# Patient Record
Sex: Female | Born: 1955 | Race: White | Hispanic: No | Smoking: Former smoker
Health system: Southern US, Community
[De-identification: ages and names within clinical notes are randomized; demographics above are authoritative.]

## PROBLEM LIST (undated history)

## (undated) DIAGNOSIS — E756 Lipid storage disorder, unspecified: Secondary | ICD-10-CM

## (undated) DIAGNOSIS — J45909 Unspecified asthma, uncomplicated: Secondary | ICD-10-CM

## (undated) DIAGNOSIS — E8809 Other disorders of plasma-protein metabolism, not elsewhere classified: Secondary | ICD-10-CM

## (undated) DIAGNOSIS — I10 Essential (primary) hypertension: Secondary | ICD-10-CM

## (undated) DIAGNOSIS — C801 Malignant (primary) neoplasm, unspecified: Secondary | ICD-10-CM

## (undated) DIAGNOSIS — R011 Cardiac murmur, unspecified: Secondary | ICD-10-CM

## (undated) DIAGNOSIS — D649 Anemia, unspecified: Secondary | ICD-10-CM

## (undated) DIAGNOSIS — K219 Gastro-esophageal reflux disease without esophagitis: Secondary | ICD-10-CM

## (undated) DIAGNOSIS — D219 Benign neoplasm of connective and other soft tissue, unspecified: Secondary | ICD-10-CM

## (undated) DIAGNOSIS — F419 Anxiety disorder, unspecified: Secondary | ICD-10-CM

## (undated) DIAGNOSIS — D499 Neoplasm of unspecified behavior of unspecified site: Secondary | ICD-10-CM

## (undated) DIAGNOSIS — T7840XA Allergy, unspecified, initial encounter: Secondary | ICD-10-CM

## (undated) HISTORY — DX: Essential (primary) hypertension: I10

## (undated) HISTORY — DX: Unspecified asthma, uncomplicated: J45.909

## (undated) HISTORY — DX: Neoplasm of unspecified behavior of unspecified site: D49.9

## (undated) HISTORY — DX: Allergy, unspecified, initial encounter: T78.40XA

## (undated) HISTORY — DX: Benign neoplasm of connective and other soft tissue, unspecified: D21.9

## (undated) HISTORY — DX: Anxiety disorder, unspecified: F41.9

## (undated) HISTORY — DX: Gastro-esophageal reflux disease without esophagitis: K21.9

## (undated) HISTORY — PX: TUBAL LIGATION: SHX77

## (undated) HISTORY — DX: Cardiac murmur, unspecified: R01.1

## (undated) HISTORY — DX: Anemia, unspecified: D64.9

## (undated) HISTORY — DX: Malignant (primary) neoplasm, unspecified: C80.1

---

## 2006-03-04 ENCOUNTER — Ambulatory Visit: Payer: Self-pay | Admitting: Gastroenterology

## 2006-05-27 HISTORY — PX: HYSTEROSCOPY: SHX211

## 2006-07-16 ENCOUNTER — Encounter (INDEPENDENT_AMBULATORY_CARE_PROVIDER_SITE_OTHER): Payer: Self-pay | Admitting: *Deleted

## 2006-07-16 ENCOUNTER — Ambulatory Visit (HOSPITAL_BASED_OUTPATIENT_CLINIC_OR_DEPARTMENT_OTHER): Admission: RE | Admit: 2006-07-16 | Discharge: 2006-07-16 | Payer: Self-pay | Admitting: Obstetrics and Gynecology

## 2006-09-10 ENCOUNTER — Ambulatory Visit: Payer: Self-pay

## 2006-10-22 ENCOUNTER — Other Ambulatory Visit: Admission: RE | Admit: 2006-10-22 | Discharge: 2006-10-22 | Payer: Self-pay | Admitting: Obstetrics and Gynecology

## 2007-05-28 HISTORY — PX: ENDOMETRIAL ABLATION: SHX621

## 2007-10-27 ENCOUNTER — Other Ambulatory Visit: Admission: RE | Admit: 2007-10-27 | Discharge: 2007-10-27 | Payer: Self-pay | Admitting: Obstetrics and Gynecology

## 2008-06-22 ENCOUNTER — Emergency Department: Payer: Self-pay | Admitting: Emergency Medicine

## 2008-11-02 ENCOUNTER — Ambulatory Visit: Payer: Self-pay | Admitting: Obstetrics and Gynecology

## 2008-11-02 ENCOUNTER — Encounter: Payer: Self-pay | Admitting: Obstetrics and Gynecology

## 2008-11-02 ENCOUNTER — Other Ambulatory Visit: Admission: RE | Admit: 2008-11-02 | Discharge: 2008-11-02 | Payer: Self-pay | Admitting: Obstetrics and Gynecology

## 2008-12-20 ENCOUNTER — Ambulatory Visit: Payer: Self-pay | Admitting: Obstetrics and Gynecology

## 2008-12-28 ENCOUNTER — Ambulatory Visit: Payer: Self-pay | Admitting: Obstetrics and Gynecology

## 2009-11-07 ENCOUNTER — Other Ambulatory Visit: Admission: RE | Admit: 2009-11-07 | Discharge: 2009-11-07 | Payer: Self-pay | Admitting: Obstetrics and Gynecology

## 2009-11-07 ENCOUNTER — Ambulatory Visit: Payer: Self-pay | Admitting: Obstetrics and Gynecology

## 2009-12-27 ENCOUNTER — Ambulatory Visit (HOSPITAL_COMMUNITY): Admission: RE | Admit: 2009-12-27 | Discharge: 2009-12-27 | Payer: Self-pay | Admitting: Obstetrics and Gynecology

## 2010-10-12 NOTE — Op Note (Signed)
Diane Gomez, Diane Gomez             ACCOUNT NO.:  [id number]   MEDICAL RECORD NO.:  [id number]          PATIENT TYPE:  AMB   LOCATION:  [facility]                         FACILITY:  [facility]   PHYSICIAN:  Daniel L. 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   POSTOPERATIVE DIAGNOSIS:  Menorrhagia with submucous myoma.   OPERATIONS:  Hysteroscopic myomectomy.   SURGEON:  Daniel L. Eda Paschal, M.D.   ANESTHESIA:  General.   INDICATIONS:  The patient is a 55 year old female who had presented to  the office with persistent menorrhagia.  That was progressively getting  worse.  A saline infusion histogram was done.  She had an intrauterine  cavity defect that was in the lower uterine segment that looked to be  most consistent with a submucous myoma.  It was very difficult to get  past it to even do an SIH.   FINDINGS:  External is normal.  BUS is normal.  Vaginal is normal.  Cervix is clean.  Uterus is anteverted, normal size and shape.  There is  1-1/2 degrees of descensus.  Adnexa failed to reveal masses.  At the  time of hysteroscopy, the patient had a 3-4 cm submucous myoma.  It  started right at the lower uterine segment blocking access into the  intrauterine cavity.  It appeared to be attached, at the top of the  fundus, posteriorly.  Once it was removed; the patient's cavity was  otherwise unremarkable.   DESCRIPTION OF PROCEDURE:  After adequate general endotracheal  anesthesia the patient was placed in the dorsal supine position; and  prepped and draped in the usual sterile manner.  A single-tooth  tenaculum was placed in the anterior lip of the cervix.  An attempt was  made to dilate her cervix which was difficult because of the obstructing  lower uterine segment mass.  She was dilated to a #25 Pratt dilator.  A  diagnostic hysteroscope was introduced.  It was obvious that she had a  submucous myoma filling the lower uterine cavity as well as the top.  We  could, however, get the hysteroscope passed it.  At this point, she was  dilated further to a #31 Pratt dilator knowing exactly which way the  canal and the cavity went.  The hysteroscopic resectoscope was then  introduced.  It was attached to 3% sorbitol to expand the intrauterine  cavity.  A camera for magnification, and a 90-degree wire loop was used  with appropriate settings.   Starting carefully we were able to at least partially get past the myoma  resect enough of it, to collapse a little bit, then we could get higher-  and-higher and finally after approximately 25 minutes we could resect  the entire myoma; and it was sent to pathology in multiple pieces.  There was multiple oozing from where it was attached to the endometrial  cavity.  These were all cauterized; at the termination of procedure  there was absolutely no bleeding  noted.  A fair bit of the endometrial cavity was cauterized; and it was  felt that she almost had had an endometrial ablation as well.  Fluid  deficit was 200 mL.  Blood loss was minimal.  The patient tolerated the  procedure well; and left the operating satisfactory condition.      Daniel L. Eda Paschal, M.D.  Electronically Signed     DLG/MEDQ  D:  07/16/2006  T:  07/16/2006  Job:  [id number]

## 2010-11-13 ENCOUNTER — Encounter (INDEPENDENT_AMBULATORY_CARE_PROVIDER_SITE_OTHER): Payer: 59 | Admitting: Obstetrics and Gynecology

## 2010-11-13 ENCOUNTER — Other Ambulatory Visit: Payer: Self-pay | Admitting: Obstetrics and Gynecology

## 2010-11-13 ENCOUNTER — Other Ambulatory Visit (HOSPITAL_COMMUNITY)
Admission: RE | Admit: 2010-11-13 | Discharge: 2010-11-13 | Disposition: A | Discharge status: Home / Self Care | Payer: 59 | Source: Ambulatory Visit | Attending: Obstetrics and Gynecology | Admitting: Obstetrics and Gynecology

## 2010-11-13 DIAGNOSIS — Z124 Encounter for screening for malignant neoplasm of cervix: Secondary | ICD-10-CM | POA: Insufficient documentation

## 2010-11-13 DIAGNOSIS — R82998 Other abnormal findings in urine: Secondary | ICD-10-CM

## 2010-11-13 DIAGNOSIS — Z01419 Encounter for gynecological examination (general) (routine) without abnormal findings: Secondary | ICD-10-CM

## 2011-01-01 ENCOUNTER — Ambulatory Visit (HOSPITAL_COMMUNITY): Payer: 59 | Attending: Obstetrics and Gynecology

## 2011-01-01 DIAGNOSIS — M810 Age-related osteoporosis without current pathological fracture: Secondary | ICD-10-CM | POA: Insufficient documentation

## 2011-10-18 ENCOUNTER — Encounter: Payer: Self-pay | Admitting: Obstetrics and Gynecology

## 2011-10-18 ENCOUNTER — Ambulatory Visit (INDEPENDENT_AMBULATORY_CARE_PROVIDER_SITE_OTHER): Payer: Self-pay | Admitting: Obstetrics and Gynecology

## 2011-10-18 VITALS — BP 120/76 | Ht 61.5 in | Wt 136.0 lb

## 2011-10-18 DIAGNOSIS — I10 Essential (primary) hypertension: Secondary | ICD-10-CM | POA: Insufficient documentation

## 2011-10-18 DIAGNOSIS — Z01419 Encounter for gynecological examination (general) (routine) without abnormal findings: Secondary | ICD-10-CM

## 2011-10-18 DIAGNOSIS — M810 Age-related osteoporosis without current pathological fracture: Secondary | ICD-10-CM | POA: Insufficient documentation

## 2011-10-18 DIAGNOSIS — D259 Leiomyoma of uterus, unspecified: Secondary | ICD-10-CM | POA: Insufficient documentation

## 2011-10-18 LAB — LIPID PANEL
LDL Cholesterol: 149 mg/dL — ABNORMAL HIGH (ref 0–99)
VLDL: 16 mg/dL (ref 0–40)

## 2011-10-18 MED ORDER — ESTRADIOL 1 MG PO TABS: 1.0000 mg | ORAL_TABLET | Freq: Every day | ORAL | Status: DC

## 2011-10-18 MED ORDER — MEDROXYPROGESTERONE ACETATE 2.5 MG PO TABS: 2.5000 mg | ORAL_TABLET | Freq: Every day | ORAL | Status: DC

## 2011-10-18 NOTE — Progress Notes (Signed)
Patient came to see me today for her annual GYN exam. She has remained amenorrheic both from her endometrial ablation and then from menopause. Initially after she was diagnosed as menopausal she did well. At one point she was bothered by vaginal dryness. She is currently fine vaginally without symptoms. She is however having more hot flashes and was wondering about treatment. She is due for yearly mammogram. She is having no pelvic pain. She has osteoporosis on bone density and is done 2 years of Reclast without problems. She takes calcium and vitamin D regularly. She has had no fractures. Her last Reclast was in July of 2012. She is currently on ensured and has not had a bone density since starting Reclast. She requested we checked her cholesterol today. It was last checked by [place] in June of 2010 and her total cholesterol was 242 with her HDL 100. Her triglycerides were elevated 198. Her LDL was 103. She has fasted today.  Physical examination: Kennon Portela present. HEENT within normal limits. Neck: Thyroid not large. No masses. Supraclavicular nodes: not enlarged. Breasts: Examined in both sitting and lying  position. No skin changes and no masses. Abdomen: Soft no guarding rebound or masses or hernia. Pelvic: External: Within normal limits. BUS: Within normal limits. Vaginal:within normal limits. Good estrogen effect. No evidence of cystocele rectocele or enterocele. Cervix: clean. Uterus: Normal size and shape. Adnexa: No masses. Rectovaginal exam: Confirmatory and negative. Extremities: Within normal limits.  Assessment: #1. Menopausal symptoms #2. Osteoporosis #3.  Abnormal lipid profile in 2010.  Plan: We discussed pros and cons of HRT and she will start estradiol 1 mg daily with medroxyprogesterone  2.5 mg daily. Information given on a mammographic scolarship. We discussed followup bone density to be sure Reclast is working. If she cannot afford to do this I suggested we follow through with her  third year of Reclast in July and she will call back in June to schedule. Lipid profile done.

## 2011-10-18 NOTE — Patient Instructions (Signed)
Call in June to schedule Reclast for July.

## 2011-10-19 LAB — URINALYSIS W MICROSCOPIC + REFLEX CULTURE
Casts: NONE SEEN
Glucose, UA: NEGATIVE mg/dL
pH: 7 (ref 5.0–8.0)

## 2011-10-20 LAB — URINE CULTURE: Organism ID, Bacteria: NO GROWTH

## 2011-10-23 ENCOUNTER — Other Ambulatory Visit: Payer: Self-pay | Admitting: *Deleted

## 2011-10-23 DIAGNOSIS — E7800 Pure hypercholesterolemia, unspecified: Secondary | ICD-10-CM

## 2011-10-23 DIAGNOSIS — D729 Disorder of white blood cells, unspecified: Secondary | ICD-10-CM

## 2011-10-23 NOTE — Progress Notes (Signed)
Addended by: Valeda Malm L on: 10/23/2011 09:39 AM   Modules accepted: Orders

## 2011-10-23 NOTE — Progress Notes (Signed)
Addended by: Valeda Malm L on: 10/23/2011 09:42 AM   Modules accepted: Orders

## 2011-12-24 ENCOUNTER — Telehealth: Payer: Self-pay | Admitting: *Deleted

## 2011-12-24 NOTE — Telephone Encounter (Signed)
I did not understand the line "no insurance would prefer pills over patches". Please explain. Also before you respond to me call patient and tell her one other option would be progesterone for 12 days every third month with endometrial biopsy in 6 months. She would then only bleed once every 3 months. If that is not acceptable office visit for more creative suggestions.

## 2011-12-24 NOTE — Telephone Encounter (Signed)
Patient said she is ok with this option. The line "no insurance would prefer pills over patches" means the patient has no insurance, she would prefer a new pill over having patches prescribed for her.

## 2011-12-24 NOTE — Telephone Encounter (Signed)
Patient informed instructions.

## 2011-12-24 NOTE — Telephone Encounter (Signed)
Patient states she tried Estradiol and Provera for a month then started bleeding heavy for a week.  So she stopped meds completely for a month now, and her hormone sweats have gotten worse than they were before trying the meds.  She said "I can't deal with all this bleeding while on the pills".  Wants to know if there is another option? No insurance would prefer pills over patches.  Please advise.

## 2011-12-24 NOTE — Telephone Encounter (Signed)
Have patient do estradiol 1 mg daily. Have her do medroxyprogesterone 5 mg first 12 days of every third month. Have her do it first in October. Hopefully she will not bleed. I would like to see her in late October.

## 2012-01-08 ENCOUNTER — Other Ambulatory Visit: Payer: Self-pay | Admitting: Obstetrics and Gynecology

## 2012-01-08 DIAGNOSIS — M810 Age-related osteoporosis without current pathological fracture: Secondary | ICD-10-CM

## 2012-01-16 ENCOUNTER — Ambulatory Visit (INDEPENDENT_AMBULATORY_CARE_PROVIDER_SITE_OTHER): Payer: Self-pay

## 2012-01-16 DIAGNOSIS — M810 Age-related osteoporosis without current pathological fracture: Secondary | ICD-10-CM

## 2012-01-29 ENCOUNTER — Telehealth: Payer: Self-pay | Admitting: *Deleted

## 2012-01-29 NOTE — Telephone Encounter (Signed)
Pt called ready to set up reclast. Will get calcium and creatinine at [facility]. Will mail order to patient. Once received results I will call patient and schedule reclast. Renette Butters

## 2012-02-07 NOTE — Telephone Encounter (Signed)
Lm for pt asking about labwork we havent received results yet. KW

## 2012-02-18 ENCOUNTER — Encounter: Payer: Self-pay | Admitting: *Deleted

## 2012-02-18 NOTE — Telephone Encounter (Signed)
Labs received and nml. Reclast apt at [facility] 10/4 at 9am, pt informed and order faxed. Reminded pt about Tylenol ES tid starting day before infusion. Reminder mailed to pt. KW

## 2012-02-27 ENCOUNTER — Other Ambulatory Visit (HOSPITAL_COMMUNITY): Payer: Self-pay | Admitting: *Deleted

## 2012-02-28 ENCOUNTER — Ambulatory Visit (HOSPITAL_COMMUNITY)
Admission: RE | Admit: 2012-02-28 | Discharge: 2012-02-28 | Disposition: A | Discharge status: Home / Self Care | Payer: Self-pay | Source: Ambulatory Visit | Attending: Obstetrics and Gynecology | Admitting: Obstetrics and Gynecology

## 2012-02-28 DIAGNOSIS — S72009A Fracture of unspecified part of neck of unspecified femur, initial encounter for closed fracture: Secondary | ICD-10-CM | POA: Insufficient documentation

## 2012-02-28 DIAGNOSIS — M810 Age-related osteoporosis without current pathological fracture: Secondary | ICD-10-CM | POA: Insufficient documentation

## 2012-02-28 DIAGNOSIS — X58XXXA Exposure to other specified factors, initial encounter: Secondary | ICD-10-CM | POA: Insufficient documentation

## 2012-02-28 DIAGNOSIS — Z880 Allergy status to penicillin: Secondary | ICD-10-CM | POA: Insufficient documentation

## 2012-02-28 MED ORDER — ZOLEDRONIC ACID 5 MG/100ML IV SOLN: 5.0000 mg | Freq: Once | INTRAVENOUS | Status: DC

## 2012-02-28 MED ORDER — ZOLEDRONIC ACID 5 MG/100ML IV SOLN
INTRAVENOUS | Status: AC
  Administered 2012-02-28: 5 mg
  Filled 2012-02-28: qty 100

## 2012-03-02 ENCOUNTER — Telehealth: Payer: Self-pay | Admitting: *Deleted

## 2012-03-02 NOTE — Telephone Encounter (Signed)
Office visit now.   

## 2012-03-02 NOTE — Telephone Encounter (Signed)
(  pt aware you out of the office) Pt is calling c/o sweating while taking her progesterone 5 mg tablets. Pt would just break out sweating while putting on her make up. Pt is not having any bleeding, some cramping. On 12/24/11 office note you asked pt to make OV in late October, would you like pt to make OV now? Or wait to later in month? Please advise

## 2012-03-03 NOTE — Telephone Encounter (Signed)
Left the below on pt voicemail. 

## 2012-03-05 ENCOUNTER — Ambulatory Visit (INDEPENDENT_AMBULATORY_CARE_PROVIDER_SITE_OTHER): Payer: Self-pay | Admitting: Obstetrics and Gynecology

## 2012-03-05 DIAGNOSIS — N951 Menopausal and female climacteric states: Secondary | ICD-10-CM

## 2012-03-05 DIAGNOSIS — R4586 Emotional lability: Secondary | ICD-10-CM

## 2012-03-05 DIAGNOSIS — F39 Unspecified mood [affective] disorder: Secondary | ICD-10-CM

## 2012-03-05 MED ORDER — PROGESTERONE MICRONIZED 200 MG PO CAPS: ORAL_CAPSULE | ORAL | Status: DC

## 2012-03-05 NOTE — Progress Notes (Signed)
Earlier  this year we had initiated hormone replacement therapy with daily estradiol and progestin. Patient had persistent bleeding. She has a history of a submucous myoma which we removed with hysteroscope. As a result of this we kept her on daily estradiol but tried 12 days of Provera every third month. When she stopped her progestin she stopped bleeding completely. She started her Provera 5 mg on October 1 and has had terrible mood swings and irritability. She actually says it somewhat better today. We had a very long discussion of the above. She does understand that this is a protocol that is not usually used and we need to be careful about her endometrium and hyperplasia and uterine cancer. We discussed  today switching her to Prometrium and I did call it in for her. We however after a very long discussion decided to continue the Provera and see if her symptoms get better. I did ask her to return at the end of December for endometrial biopsy to be sure her endometrium is adequately protect.

## 2012-03-05 NOTE — Patient Instructions (Signed)
Return for endometrial biopsy in late December.

## 2012-03-23 ENCOUNTER — Ambulatory Visit (INDEPENDENT_AMBULATORY_CARE_PROVIDER_SITE_OTHER): Payer: Self-pay | Admitting: Obstetrics and Gynecology

## 2012-03-23 DIAGNOSIS — IMO0002 Reserved for concepts with insufficient information to code with codable children: Secondary | ICD-10-CM

## 2012-03-23 DIAGNOSIS — N898 Other specified noninflammatory disorders of vagina: Secondary | ICD-10-CM

## 2012-03-23 LAB — WET PREP FOR TRICH, YEAST, CLUE: Yeast Wet Prep HPF POC: NONE SEEN

## 2012-03-23 MED ORDER — METRONIDAZOLE 0.75 % VA GEL: 1.0000 | Freq: Every day | VAGINAL | Status: DC

## 2012-03-23 NOTE — Patient Instructions (Addendum)
Return in December for biopsy.

## 2012-03-23 NOTE — Progress Notes (Signed)
Patient came in today with the following complaints. She took Provera 5 mg day 1 to 12th as per our previous discussion. She remains on daily estrogen. Starting on October 13 she had a menstrual cycle which seems to be resolving now. She however has associated it with a disagreeable vaginal odor and dyspareunia every time they have sex which is unusual for her.  Exam: Kennon Portela present.Pelvic exam: External within normal limits. BUS within normal limits. Vaginal exam within normal limits Except for frothy discharge. Cervix is clean without lesions. Uterus is normal size and shape And nontender. Adnexa failed to reveal masses nor was there  tenderness.  Wet prep positive for amine, many clue cells, and too numerous to count bacteria. Negative for yeast.  Assessment: Bacterial vaginosis  Plan: MetroGel-Vaginal cream vaginally for 5 nights. Endometrial biopsy in December.

## 2012-03-27 ENCOUNTER — Telehealth: Payer: Self-pay | Admitting: *Deleted

## 2012-03-27 MED ORDER — FLUCONAZOLE 150 MG PO TABS: 150.0000 mg | ORAL_TABLET | Freq: Once | ORAL | Status: DC

## 2012-03-27 NOTE — Telephone Encounter (Signed)
Please call and call in Diflucan 150 by mouth times one dose with 1 refill. Inform she can gett for $4 at [place], [employer], or [employer]. Office visit if no relief

## 2012-03-27 NOTE — Telephone Encounter (Signed)
Pt informed with the below note. Rx sent.  

## 2012-03-27 NOTE — Telephone Encounter (Signed)
(  Dr.G patient) pt was given Metrogel x 5 nights on 03/23/12, pt said she now has yeast infection. Requesting medication for this itching, and vaginal burning. Pt has no insurance and would like something generic. Please advise

## 2012-05-25 ENCOUNTER — Ambulatory Visit (INDEPENDENT_AMBULATORY_CARE_PROVIDER_SITE_OTHER): Payer: Self-pay | Admitting: Obstetrics and Gynecology

## 2012-05-25 ENCOUNTER — Encounter: Payer: Self-pay | Admitting: Obstetrics and Gynecology

## 2012-05-25 DIAGNOSIS — N950 Postmenopausal bleeding: Secondary | ICD-10-CM

## 2012-05-25 NOTE — Progress Notes (Signed)
Patient came back today for an endometrial biopsy due to only taking progestin 12 days every third month. She did it in  October and had normal withdrawal bleeding. She is scheduled to also do it the first 12 days in January. She has started to notice however that the hot flashes have recurred on 1 mg estradiol.  Exam: Beola Cord present.Pelvic exam: External within normal limits. BUS within normal limits. Vaginal exam within normal limits. Cervix is clean without lesions. Uterus is normal size and shape. Adnexa failed to reveal masses. Rectovaginal examination is confirmatory and without masses.   Assessment: Postmenopausal bleeding. On nontraditional  protocol of progestin every third month. Hot flashes.  Plan: Endometrial biopsy done. We had to open her cervix with an os finder in order to obtain a biopsy. She will call for results. She will start her Provera on January 1. If biopsy OK she will increase her estradiol to 1.5 mg daily. She will return in February-March to discuss Dianna Rossetti so she can stop her progestin. Patient had a mild vasovagal response to biopsy. She was given 500mg  of Naprelan.

## 2012-05-25 NOTE — Patient Instructions (Signed)
Call for biopsy results.

## 2012-07-08 ENCOUNTER — Encounter: Payer: Self-pay | Admitting: *Deleted

## 2012-07-08 NOTE — Progress Notes (Signed)
Patient ID: Diane Gomez, female   DOB: [birth year], 57 y.o.   MRN: [id number] Pt called requesting if new drug Massie Kluver has been release. Left message on pt voicemail that drug is not out yet.

## 2012-10-21 ENCOUNTER — Other Ambulatory Visit (HOSPITAL_COMMUNITY)
Admission: RE | Admit: 2012-10-21 | Discharge: 2012-10-21 | Disposition: A | Discharge status: Home / Self Care | Payer: Self-pay | Source: Ambulatory Visit | Attending: Women's Health | Admitting: Women's Health

## 2012-10-21 ENCOUNTER — Encounter: Payer: Self-pay | Admitting: Women's Health

## 2012-10-21 ENCOUNTER — Ambulatory Visit (INDEPENDENT_AMBULATORY_CARE_PROVIDER_SITE_OTHER): Payer: Self-pay | Admitting: Women's Health

## 2012-10-21 VITALS — BP 124/78 | Ht 61.0 in | Wt 130.0 lb

## 2012-10-21 DIAGNOSIS — Z79890 Hormone replacement therapy (postmenopausal): Secondary | ICD-10-CM

## 2012-10-21 DIAGNOSIS — Z01419 Encounter for gynecological examination (general) (routine) without abnormal findings: Secondary | ICD-10-CM

## 2012-10-21 LAB — LIPID PANEL
Cholesterol: 237 mg/dL — ABNORMAL HIGH (ref 0–200)
LDL Cholesterol: 107 mg/dL — ABNORMAL HIGH (ref 0–99)
VLDL: 27 mg/dL (ref 0–40)

## 2012-10-21 LAB — CBC WITH DIFFERENTIAL/PLATELET
Hemoglobin: 13.4 g/dL (ref 12.0–15.0)
Lymphocytes Relative: 31 % (ref 12–46)
Lymphs Abs: 1.1 10*3/uL (ref 0.7–4.0)
MCH: 32.2 pg (ref 26.0–34.0)
Monocytes Relative: 10 % (ref 3–12)
Neutro Abs: 2.1 10*3/uL (ref 1.7–7.7)
Neutrophils Relative %: 57 % (ref 43–77)
Platelets: 282 10*3/uL (ref 150–400)
RBC: 4.16 MIL/uL (ref 3.87–5.11)
WBC: 3.7 10*3/uL — ABNORMAL LOW (ref 4.0–10.5)

## 2012-10-21 LAB — GLUCOSE, RANDOM: Glucose, Bld: 95 mg/dL (ref 70–99)

## 2012-10-21 MED ORDER — ESTRADIOL 1 MG PO TABS: 1.0000 mg | ORAL_TABLET | Freq: Every day | ORAL | Status: DC

## 2012-10-21 MED ORDER — PROGESTERONE MICRONIZED 200 MG PO CAPS: ORAL_CAPSULE | ORAL | Status: DC

## 2012-10-21 NOTE — Patient Instructions (Addendum)
Health Recommendations for Postmenopausal Women Respected and ongoing research has looked at the most common causes of death, disability, and poor quality of life in postmenopausal women. The causes include heart disease, diseases of blood vessels, diabetes, depression, cancer, and bone loss (osteoporosis). Many things can be done to help lower the chances of developing these and other common problems: CARDIOVASCULAR DISEASE Heart Disease: A heart attack is a medical emergency. Know the signs and symptoms of a heart attack. Below are things women can do to reduce their risk for heart disease.   Do not smoke. If you smoke, quit.  Aim for a healthy weight. Being overweight causes many preventable deaths. Eat a healthy and balanced diet and drink an adequate amount of liquids.  Get moving. Make a commitment to be more physically active. Aim for 30 minutes of activity on most, if not all days of the week.  Eat for heart health. Choose a diet that is low in saturated fat and cholesterol and eliminate trans fat. Include whole grains, vegetables, and fruits. Read and understand the labels on food containers before buying.  Know your numbers. Ask your caregiver to check your blood pressure, cholesterol (total, HDL, LDL, triglycerides) and blood glucose. Work with your caregiver on improving your entire clinical picture.  High blood pressure. Limit or stop your table salt intake (try salt substitute and food seasonings). Avoid salty foods and drinks. Read labels on food containers before buying. Eating well and exercising can help control high blood pressure. STROKE  Stroke is a medical emergency. Stroke may be the result of a blood clot in a blood vessel in the brain or by a brain hemorrhage (bleeding). Know the signs and symptoms of a stroke. To lower the risk of developing a stroke:  Avoid fatty foods.  Quit smoking.  Control your diabetes, blood pressure, and irregular heart rate. THROMBOPHLEBITIS  (BLOOD CLOT) OF THE LEG  Becoming overweight and leading a stationary lifestyle may also contribute to developing blood clots. Controlling your diet and exercising will help lower the risk of developing blood clots. CANCER SCREENING  Breast Cancer: Take steps to reduce your risk of breast cancer.  You should practice "breast self-awareness." This means understanding the normal appearance and feel of your breasts and should include breast self-examination. Any changes detected, no matter how small, should be reported to your caregiver.  After age 40, you should have a clinical breast exam (CBE) every year.  Starting at age 40, you should consider having a mammogram (breast X-ray) every year.  If you have a family history of breast cancer, talk to your caregiver about genetic screening.  If you are at high risk for breast cancer, talk to your caregiver about having an MRI and a mammogram every year.  Intestinal or Stomach Cancer: Tests to consider are a rectal exam, fecal occult blood, sigmoidoscopy, and colonoscopy. Women who are high risk may need to be screened at an earlier age and more often.  Cervical Cancer:  Beginning at age 30, you should have a Pap test every 3 years as long as the past 3 Pap tests have been normal.  If you have had past treatment for cervical cancer or a condition that could lead to cancer, you need Pap tests and screening for cancer for at least 20 years after your treatment.  If you had a hysterectomy for a problem that was not cancer or a condition that could lead to cancer, then you no longer need Pap tests.    If you are between ages 65 and 70, and you have had normal Pap tests going back 10 years, you no longer need Pap tests.  If Pap tests have been discontinued, risk factors (such as a new sexual partner) need to be reassessed to determine if screening should be resumed.  Some medical problems can increase the chance of getting cervical cancer. In these  cases, your caregiver may recommend more frequent screening and Pap tests.  Uterine Cancer: If you have vaginal bleeding after reaching menopause, you should notify your caregiver.  Ovarian cancer: Other than yearly pelvic exams, there are no reliable tests available to screen for ovarian cancer at this time except for yearly pelvic exams.  Lung Cancer: Yearly chest X-rays can detect lung cancer and should be done on high risk women, such as cigarette smokers and women with chronic lung disease (emphysema).  Skin Cancer: A complete body skin exam should be done at your yearly examination. Avoid overexposure to the sun and ultraviolet light lamps. Use a strong sun block cream when in the sun. All of these things are important in lowering the risk of skin cancer. MENOPAUSE Menopause Symptoms: Hormone therapy products are effective for treating symptoms associated with menopause:  Moderate to severe hot flashes.  Night sweats.  Mood swings.  Headaches.  Tiredness.  Loss of sex drive.  Insomnia.  Other symptoms. Hormone replacement carries certain risks, especially in older women. Women who use or are thinking about using estrogen or estrogen with progestin treatments should discuss that with their caregiver. Your caregiver will help you understand the benefits and risks. The ideal dose of hormone replacement therapy is not known. The Food and Drug Administration (FDA) has concluded that hormone therapy should be used only at the lowest doses and for the shortest amount of time to reach treatment goals.  OSTEOPOROSIS Protecting Against Bone Loss and Preventing Fracture: If you use hormone therapy for prevention of bone loss (osteoporosis), the risks for bone loss must outweigh the risk of the therapy. Ask your caregiver about other medications known to be safe and effective for preventing bone loss and fractures. To guard against bone loss or fractures, the following is recommended:  If  you are less than age 50, take 1000 mg of calcium and at least 600 mg of Vitamin D per day.  If you are greater than age 50 but less than age 70, take 1200 mg of calcium and at least 600 mg of Vitamin D per day.  If you are greater than age 70, take 1200 mg of calcium and at least 800 mg of Vitamin D per day. Smoking and excessive alcohol intake increases the risk of osteoporosis. Eat foods rich in calcium and vitamin D and do weight bearing exercises several times a week as your caregiver suggests. DIABETES Diabetes Melitus: If you have Type I or Type 2 diabetes, you should keep your blood sugar under control with diet, exercise and recommended medication. Avoid too many sweets, starchy and fatty foods. Being overweight can make control more difficult. COGNITION AND MEMORY Cognition and Memory: Menopausal hormone therapy is not recommended for the prevention of cognitive disorders such as Alzheimer's disease or memory loss.  DEPRESSION  Depression may occur at any age, but is common in elderly women. The reasons may be because of physical, medical, social (loneliness), or financial problems and needs. If you are experiencing depression because of medical problems and control of symptoms, talk to your caregiver about this. Physical activity and   exercise may help with mood and sleep. Community and volunteer involvement may help your sense of value and worth. If you have depression and you feel that the problem is getting worse or becoming severe, talk to your caregiver about treatment options that are best for you. ACCIDENTS  Accidents are common and can be serious in the elderly woman. Prepare your house to prevent accidents. Eliminate throw rugs, place hand bars in the bath, shower and toilet areas. Avoid wearing high heeled shoes or walking on wet, snowy, and icy areas. Limit or stop driving if you have vision or hearing problems, or you feel you are unsteady with you movements and  reflexes. HEPATITIS C Hepatitis C is a type of viral infection affecting the liver. It is spread mainly through contact with blood from an infected person. It can be treated, but if left untreated, it can lead to severe liver damage over years. Many people who are infected do not know that the virus is in their blood. If you are a "baby-boomer", it is recommended that you have one screening test for Hepatitis C. IMMUNIZATIONS  Several immunizations are important to consider having during your senior years, including:   Tetanus, diptheria, and pertussis booster shot.  Influenza every year before the flu season begins.  Pneumonia vaccine.  Shingles vaccine.  Others as indicated based on your specific needs. Talk to your caregiver about these. Document Released: 07/05/2005 Document Revised: 04/29/2012 Document Reviewed: 02/29/2008 [employer] Patient Information 2014 [employer].  

## 2012-10-21 NOTE — Progress Notes (Signed)
Diane Gomez 1955-09-21 [id number]    History:    The patient presents for annual exam. Patient of Dr. Eda Paschal. Rare bleeding, currently on estradiol 1 mg and Prometrium 200 mg at bedtime every third month. Was doing a combination HRT and was having spotting with bleeding. Negative endometrial biopsy 04/2012. History of a myomectomy in 2008, HER option 2009. Osteoporosis with improvement shown on Reclast  2010 T score -2.5 at spine, -1.6 at right hip. DEXA 2013 showed significant increase in BMD , AP spine-2.2, right femoral neck -1.9. Has received 3 doses of reclast. Colonoscopy 2 benign polyps at age 25 and was instructed to return at age 61 for repeat. Normal mammogram and Pap history. Hypertension, cardiologist manages med but no labs done there.   Past medical history, past surgical history, family history and social history were all reviewed and documented in the EPIC chart. 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 Son 46, daughter 35 both doing well.   ROS:  A  ROS was performed and pertinent positives and negatives are included in the history.  Exam:  Filed Vitals:   10/21/12 0805  BP: 124/78    General appearance:  Normal Head/Neck:  Normal, without cervical or supraclavicular adenopathy. Thyroid:  Symmetrical, normal in size, without palpable masses or nodularity. Respiratory  Effort:  Normal  Auscultation:  Clear without wheezing or rhonchi Cardiovascular  Auscultation:  Regular rate, without rubs, murmurs or gallops  Edema/varicosities:  Not grossly evident Abdominal  Soft,nontender, without masses, guarding or rebound.  Liver/spleen:  No organomegaly noted  Hernia:  None appreciated  Skin  Inspection:  Grossly normal  Palpation:  Grossly normal Neurologic/psychiatric  Orientation:  Normal with appropriate conversation.  Mood/affect:  Normal  Genitourinary    Breasts: Examined lying and sitting.     Right: Without masses, retractions, discharge or  axillary adenopathy.     Left: Without masses, retractions, discharge or axillary adenopathy.   Inguinal/mons:  Normal without inguinal adenopathy  External genitalia:  Normal  BUS/Urethra/Skene's glands:  Normal  Bladder:  Normal  Vagina:  Normal  Cervix:  Normal  Uterus:  normal in size, shape and contour.  Midline and mobile  Adnexa/parametria:     Rt: Without masses or tenderness.   Lt: Without masses or tenderness.  Anus and perineum: Normal  Digital rectal exam: Normal sphincter tone without palpated masses or tenderness  Assessment/Plan:  57 y.o. DWF G3P2 for annual exam with no complaints.  Hypertension-cardiologist manages Osteoporosis - Reclast, 3 doses,  Postmenopausal on HRT  Plan: options reviewed for osteoporosis, will continue and complete 5 years on reclast Reviewed importance of scheduling dental work prior to receiving. Repeat reclast in August, will have given at  [facility]. Repeat DEXA next year.Instructed to call if any bleeding. SBE's, continue annual mammogram, calcium rich diet, vitamin D 2000 daily encouraged. CBC, glucose, lipid panel, UA, Pap, normal Pap 2012, new screening guidelines reviewed. Home Hemoccult card given with instructions. Estradiol 1 mg by mouth daily, Prometrium 200 mg at bedtime day one through 12 every third month. Reviewed [facility] study, increased risk for blood clots, strokes, breast cancer, states numerous symptoms when off would like to continue.  Discouraged tanning.    Harrington [facility], 9:43 AM 10/21/2012

## 2012-10-22 LAB — URINALYSIS W MICROSCOPIC + REFLEX CULTURE
Casts: NONE SEEN
Crystals: NONE SEEN
Glucose, UA: NEGATIVE mg/dL
Hgb urine dipstick: NEGATIVE
Leukocytes, UA: NEGATIVE
Specific Gravity, Urine: 1.023 (ref 1.005–1.030)
pH: 6 (ref 5.0–8.0)

## 2012-10-27 ENCOUNTER — Encounter: Payer: Self-pay | Admitting: Women's Health

## 2012-10-27 ENCOUNTER — Encounter: Payer: Self-pay | Admitting: Obstetrics and Gynecology

## 2012-11-11 NOTE — Progress Notes (Signed)
We received alert today that patient has not read her answer from Diane Gomez to a question she had emailed through My Chart to [place].  I copied the text and mailed it to the patient.

## 2012-11-16 ENCOUNTER — Other Ambulatory Visit: Payer: Self-pay | Admitting: Obstetrics and Gynecology

## 2012-11-18 ENCOUNTER — Other Ambulatory Visit: Payer: Self-pay | Admitting: Women's Health

## 2012-11-18 ENCOUNTER — Telehealth: Payer: Self-pay | Admitting: Women's Health

## 2012-11-18 NOTE — Telephone Encounter (Signed)
Telephone call to patient, reviewed will try Prometrium 200 for 12 days every 3 months, has bled every 3 months after taking the provera. Did have a thin endometrium on ultrasound and will continue an annual ultrasound to be sure remains been. Had problems with irregular bleeding and bleeding on continuous HRT and with monthly withdrawal.

## 2013-01-12 ENCOUNTER — Telehealth: Payer: Self-pay | Admitting: *Deleted

## 2013-01-12 NOTE — Telephone Encounter (Signed)
I called pt and she is due for Reclast. I will mail her a prescription with the labwork needed and she will get at [facility]. Once I receive lab results I will schedule reclast infusion. Sherrilyn Rist

## 2013-01-23 ENCOUNTER — Other Ambulatory Visit: Payer: Self-pay | Admitting: Obstetrics and Gynecology

## 2013-01-26 ENCOUNTER — Telehealth: Payer: Self-pay | Admitting: *Deleted

## 2013-01-26 NOTE — Telephone Encounter (Signed)
LM for pt to see if she had had her Reclast labs yet. KW

## 2013-01-27 ENCOUNTER — Other Ambulatory Visit: Payer: Self-pay | Admitting: *Deleted

## 2013-01-27 MED ORDER — MEDROXYPROGESTERONE ACETATE 2.5 MG PO TABS: 2.5000 mg | ORAL_TABLET | Freq: Every day | ORAL | Status: DC

## 2013-02-09 NOTE — Telephone Encounter (Signed)
Spoke with pt today, she has had labowork drawn but I have not received the results. She will call and have them faxed to my attention. KW

## 2013-02-12 NOTE — Telephone Encounter (Signed)
Received Labs. Labs normal. Reclast apt at [facility] 10/3 at 11am. Pt informed. Arrive at 1045. Reminded take tylenol 3 days starting day before infusion for a total of 3 days. Pt understood. Instruction sheet mailed to pt. KW

## 2013-02-25 ENCOUNTER — Other Ambulatory Visit (HOSPITAL_COMMUNITY): Payer: Self-pay | Admitting: *Deleted

## 2013-02-26 ENCOUNTER — Ambulatory Visit (HOSPITAL_COMMUNITY)
Admission: RE | Admit: 2013-02-26 | Discharge: 2013-02-26 | Disposition: A | Discharge status: Home / Self Care | Payer: Self-pay | Source: Ambulatory Visit | Attending: Gynecology | Admitting: Gynecology

## 2013-02-26 DIAGNOSIS — M810 Age-related osteoporosis without current pathological fracture: Secondary | ICD-10-CM | POA: Insufficient documentation

## 2013-02-26 MED ORDER — ZOLEDRONIC ACID 5 MG/100ML IV SOLN: 5.0000 mg | Freq: Once | INTRAVENOUS | Status: DC

## 2013-02-26 MED ORDER — ZOLEDRONIC ACID 5 MG/100ML IV SOLN
INTRAVENOUS | Status: AC
  Administered 2013-02-26: 5 mg
  Filled 2013-02-26: qty 100

## 2013-04-01 ENCOUNTER — Other Ambulatory Visit: Payer: Self-pay

## 2013-05-06 ENCOUNTER — Encounter: Payer: Self-pay | Admitting: Women's Health

## 2013-05-11 ENCOUNTER — Other Ambulatory Visit: Payer: Self-pay | Admitting: Women's Health

## 2013-05-11 MED ORDER — ESTRADIOL-NORETHINDRONE ACET 0.5-0.1 MG PO TABS: 1.0000 | ORAL_TABLET | Freq: Every day | ORAL | Status: DC

## 2013-06-23 ENCOUNTER — Telehealth: Payer: Self-pay | Admitting: *Deleted

## 2013-06-23 NOTE — Telephone Encounter (Signed)
Pt is going to have tooth removed today has been on reclast, she asked if any precautions? Please advise

## 2013-06-23 NOTE — Telephone Encounter (Signed)
Please call, make sure her Dentist is aware she had reclast, last dose was October 2014. Most research does not demonstrate any instance of osteonecrosis following dental procedures with reclast.

## 2013-06-23 NOTE — Telephone Encounter (Signed)
Pt informed with the below note. 

## 2013-07-08 ENCOUNTER — Emergency Department: Payer: Self-pay | Admitting: Emergency Medicine

## 2013-07-08 LAB — CBC
HCT: 42.1 % (ref 35.0–47.0)
HGB: 14.3 g/dL (ref 12.0–16.0)
MCH: 32.8 pg (ref 26.0–34.0)
MCHC: 34 g/dL (ref 32.0–36.0)
MCV: 96 fL (ref 80–100)
Platelet: 253 10*3/uL (ref 150–440)
RBC: 4.37 10*6/uL (ref 3.80–5.20)
RDW: 14.3 % (ref 11.5–14.5)
WBC: 7.2 10*3/uL (ref 3.6–11.0)

## 2013-07-08 LAB — COMPREHENSIVE METABOLIC PANEL
ALK PHOS: 69 U/L
ALT: 34 U/L (ref 12–78)
ANION GAP: 10 (ref 7–16)
Albumin: 4.2 g/dL (ref 3.4–5.0)
BUN: 8 mg/dL (ref 7–18)
Bilirubin,Total: 0.4 mg/dL (ref 0.2–1.0)
CALCIUM: 10 mg/dL (ref 8.5–10.1)
CHLORIDE: 105 mmol/L (ref 98–107)
CO2: 22 mmol/L (ref 21–32)
Creatinine: 0.66 mg/dL (ref 0.60–1.30)
EGFR (African American): >60
Glucose: 95 mg/dL (ref 65–99)
OSMOLALITY: 272 (ref 275–301)
Potassium: 3.3 mmol/L — ABNORMAL LOW (ref 3.5–5.1)
SGOT(AST): 41 U/L — ABNORMAL HIGH (ref 15–37)
Sodium: 137 mmol/L (ref 136–145)
Total Protein: 8.3 g/dL — ABNORMAL HIGH (ref 6.4–8.2)

## 2013-07-08 LAB — T4, FREE: Free Thyroxine: 1.18 ng/dL (ref 0.76–1.46)

## 2013-07-08 LAB — TROPONIN I

## 2013-07-08 LAB — TSH: Thyroid Stimulating Horm: 0.622 u[IU]/mL

## 2014-03-28 ENCOUNTER — Encounter: Payer: Self-pay | Admitting: Women's Health

## 2014-05-11 ENCOUNTER — Ambulatory Visit (INDEPENDENT_AMBULATORY_CARE_PROVIDER_SITE_OTHER): Payer: BC Managed Care – PPO | Admitting: Women's Health

## 2014-05-11 ENCOUNTER — Encounter: Payer: Self-pay | Admitting: Women's Health

## 2014-05-11 VITALS — BP 118/84 | Ht 61.0 in | Wt 130.0 lb

## 2014-05-11 DIAGNOSIS — Z23 Encounter for immunization: Secondary | ICD-10-CM

## 2014-05-11 DIAGNOSIS — Z01419 Encounter for gynecological examination (general) (routine) without abnormal findings: Secondary | ICD-10-CM

## 2014-05-11 DIAGNOSIS — M8580 Other specified disorders of bone density and structure, unspecified site: Secondary | ICD-10-CM

## 2014-05-11 NOTE — Progress Notes (Signed)
Diane Gomez 09/09/1955 [id number]    History:    Presents for annual exam.  Postmenopausal/no bleeding/no HRT. Stopped HRT one year ago having some hot flushes but no longer having any cramping or spotting. Negative endometrial biopsy 2013. 2008 myomectomy. Negative colonoscopy age 58. Mammogram overdue. Osteoporosis, Reclast 2010 for 4 years. Same partner years. Hypertension primary care manages labs and meds.  Past medical history, past surgical history, family history and social history were all reviewed and documented in the EPIC chart. 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 Mother died age 62.  ROS:  A  12 point ROS was performed and pertinent positives and negatives are included.  Exam:  Filed Vitals:   05/11/14 0809  BP: 118/84    General appearance:  Normal Thyroid:  Symmetrical, normal in size, without palpable masses or nodularity. Respiratory  Auscultation:  Clear without wheezing or rhonchi Cardiovascular  Auscultation:  Regular rate, without rubs, murmurs or gallops  Edema/varicosities:  Not grossly evident Abdominal  Soft,nontender, without masses, guarding or rebound.  Liver/spleen:  No organomegaly noted  Hernia:  None appreciated  Skin  Inspection:  Grossly normal   Breasts: Examined lying and sitting.     Right: Without masses, retractions, discharge or axillary adenopathy.     Left: Without masses, retractions, discharge or axillary adenopathy. Gentitourinary   Inguinal/mons:  Normal without inguinal adenopathy  External genitalia:  Normal  BUS/Urethra/Skene's glands:  Normal  Vagina:  Normal  Cervix:  Normal  Uterus:   normal in size, shape and contour.  Midline and mobile  Adnexa/parametria:     Rt: Without masses or tenderness.   Lt: Without masses or tenderness.  Anus and perineum: Normal  Digital rectal exam: Normal sphincter tone without palpated masses or tenderness  Assessment/Plan:  58 y.o. DWF G2P2  for annual exam with no complaints.  Osteoporosis Reclast for 4 years with improvement Postmenopausal/no bleeding/no HRT Hypertension primary care manages labs and meds  Plan: Schedule bone density. Home safety, fall prevention and importance of regular exercise for bone health reviewed. SBE's, reviewed importance of annual screening mammogram, instructed to schedule, information breast center given. Continue active lifestyle, regular exercise, calcium rich diet, vitamin D 2000 daily encouraged. Instructed to have vitamin D level checked at next primary care annual exam.  Prescription for Zostavax given, will check if has had Tdap at primary care in the past 10 years. Repeat screening colonoscopy reviewed, will schedule. Pap normal 2014, new screening guidelines reviewed.    Huel Cote [facility], 9:26 AM 05/11/2014

## 2014-05-11 NOTE — Patient Instructions (Signed)
Health Recommendations for Postmenopausal Women Respected and ongoing research has looked at the most common causes of death, disability, and poor quality of life in postmenopausal women. The causes include heart disease, diseases of blood vessels, diabetes, depression, cancer, and bone loss (osteoporosis). Many things can be done to help lower the chances of developing these and other common problems. CARDIOVASCULAR DISEASE Heart Disease: Diane heart attack is Diane medical emergency. Know the signs and symptoms of Diane heart attack. Below are things women can do to reduce their risk for heart disease.   Do not smoke. If you smoke, quit.  Aim for Diane healthy weight. Being overweight causes many preventable deaths. Eat Diane healthy and balanced diet and drink an adequate amount of liquids.  Get moving. Make Diane commitment to be more physically active. Aim for 30 minutes of activity on most, if not all days of the week.  Eat for heart health. Choose Diane diet that is low in saturated fat and cholesterol and eliminate trans fat. Include whole grains, vegetables, and fruits. Read and understand the labels on food containers before buying.  Know your numbers. Ask your caregiver to check your blood pressure, cholesterol (total, HDL, LDL, triglycerides) and blood glucose. Work with your caregiver on improving your entire clinical picture.  High blood pressure. Limit or stop your table salt intake (try salt substitute and food seasonings). Avoid salty foods and drinks. Read labels on food containers before buying. Eating well and exercising can help control high blood pressure. STROKE  Stroke is Diane medical emergency. Stroke may be the result of Diane blood clot in Diane blood vessel in the brain or by Diane brain hemorrhage (bleeding). Know the signs and symptoms of Diane stroke. To lower the risk of developing Diane stroke:  Avoid fatty foods.  Quit smoking.  Control your diabetes, blood pressure, and irregular heart  rate. THROMBOPHLEBITIS (BLOOD CLOT) OF THE LEG  Becoming overweight and leading Diane stationary lifestyle may also contribute to developing blood clots. Controlling your diet and exercising will help lower the risk of developing blood clots. CANCER SCREENING  Breast Cancer: Take steps to reduce your risk of breast cancer.  You should practice "breast self-awareness." This means understanding the normal appearance and feel of your breasts and should include breast self-examination. Any changes detected, no matter how small, should be reported to your caregiver.  After age 40, you should have Diane clinical breast exam (CBE) every year.  Starting at age 40, you should consider having Diane mammogram (breast X-ray) every year.  If you have Diane family history of breast cancer, talk to your caregiver about genetic screening.  If you are at high risk for breast cancer, talk to your caregiver about having an MRI and Diane mammogram every year.  Intestinal or Stomach Cancer: Tests to consider are Diane rectal exam, fecal occult blood, sigmoidoscopy, and colonoscopy. Women who are high risk may need to be screened at an earlier age and more often.  Cervical Cancer:  Beginning at age 30, you should have Diane Pap test every 3 years as long as the past 3 Pap tests have been normal.  If you have had past treatment for cervical cancer or Diane condition that could lead to cancer, you need Pap tests and screening for cancer for at least 20 years after your treatment.  If you had Diane hysterectomy for Diane problem that was not cancer or Diane condition that could lead to cancer, then you no longer need Pap tests.    If you are between ages 65 and 70, and you have had normal Pap tests going back 10 years, you no longer need Pap tests.  If Pap tests have been discontinued, risk factors (such as Diane new sexual partner) need to be reassessed to determine if screening should be resumed.  Some medical problems can increase the chance of getting  cervical cancer. In these cases, your caregiver may recommend more frequent screening and Pap tests.  Uterine Cancer: If you have vaginal bleeding after reaching menopause, you should notify your caregiver.  Ovarian Cancer: Other than yearly pelvic exams, there are no reliable tests available to screen for ovarian cancer at this time except for yearly pelvic exams.  Lung Cancer: Yearly chest X-rays can detect lung cancer and should be done on high risk women, such as cigarette smokers and women with chronic lung disease (emphysema).  Skin Cancer: Diane complete body skin exam should be done at your yearly examination. Avoid overexposure to the sun and ultraviolet light lamps. Use Diane strong sun block cream when in the sun. All of these things are important for lowering the risk of skin cancer. MENOPAUSE Menopause Symptoms: Hormone therapy products are effective for treating symptoms associated with menopause:  Moderate to severe hot flashes.  Night sweats.  Mood swings.  Headaches.  Tiredness.  Loss of sex drive.  Insomnia.  Other symptoms. Hormone replacement carries certain risks, especially in older women. Women who use or are thinking about using estrogen or estrogen with progestin treatments should discuss that with their caregiver. Your caregiver will help you understand the benefits and risks. The ideal dose of hormone replacement therapy is not known. The Food and Drug Administration (FDA) has concluded that hormone therapy should be used only at the lowest doses and for the shortest amount of time to reach treatment goals.  OSTEOPOROSIS Protecting Against Bone Loss and Preventing Fracture If you use hormone therapy for prevention of bone loss (osteoporosis), the risks for bone loss must outweigh the risk of the therapy. Ask your caregiver about other medications known to be safe and effective for preventing bone loss and fractures. To guard against bone loss or fractures, the  following is recommended:  If you are younger than age 50, take 1000 mg of calcium and at least 600 mg of Vitamin D per day.  If you are older than age 50 but younger than age 70, take 1200 mg of calcium and at least 600 mg of Vitamin D per day.  If you are older than age 70, take 1200 mg of calcium and at least 800 mg of Vitamin D per day. Smoking and excessive alcohol intake increases the risk of osteoporosis. Eat foods rich in calcium and vitamin D and do weight bearing exercises several times Diane week as your caregiver suggests. DIABETES Diabetes Mellitus: If you have type I or type 2 diabetes, you should keep your blood sugar under control with diet, exercise, and recommended medication. Avoid starchy and fatty foods, and too many sweets. Being overweight can make diabetes control more difficult. COGNITION AND MEMORY Cognition and Memory: Menopausal hormone therapy is not recommended for the prevention of cognitive disorders such as Alzheimer's disease or memory loss.  DEPRESSION  Depression may occur at any age, but it is common in elderly women. This may be because of physical, medical, social (loneliness), or financial problems and needs. If you are experiencing depression because of medical problems and control of symptoms, talk to your caregiver about this. Physical   activity and exercise may help with mood and sleep. Community and volunteer involvement may improve your sense of value and worth. If you have depression and you feel that the problem is getting worse or becoming severe, talk to your caregiver about which treatment options are best for you. ACCIDENTS  Accidents are common and can be serious in elderly woman. Prepare your house to prevent accidents. Eliminate throw rugs, place hand bars in bath, shower, and toilet areas. Avoid wearing high heeled shoes or walking on wet, snowy, and icy areas. Limit or stop driving if you have vision or hearing problems, or if you feel you are  unsteady with your movements and reflexes. HEPATITIS C Hepatitis C is Diane type of viral infection affecting the liver. It is spread mainly through contact with blood from an infected person. It can be treated, but if left untreated, it can lead to severe liver damage over the years. Many people who are infected do not know that the virus is in their blood. If you are Diane "baby-boomer", it is recommended that you have one screening test for Hepatitis C. IMMUNIZATIONS  Several immunizations are important to consider having during your senior years, including:   Tetanus, diphtheria, and pertussis booster shot.  Influenza every year before the flu season begins.  Pneumonia vaccine.  Shingles vaccine.  Others, as indicated based on your specific needs. Talk to your caregiver about these. Document Released: 07/05/2005 Document Revised: 09/27/2013 Document Reviewed: 02/29/2008 ExitCare Patient Information 2015 [employer]. This information is not intended to replace advice given to you by your health care provider. Make sure you discuss any questions you have with your health care provider. Bone Health Our bones do many things. They provide structure, protect organs, anchor muscles, and store calcium. Adequate calcium in your diet and weight-bearing physical activity help build strong bones, improve bone amounts, and may reduce the risk of weakening of bones (osteoporosis) later in life. PEAK BONE MASS By age 20, the average woman has acquired most of her skeletal bone mass. Diane large decline occurs in older adults which increases the risk of osteoporosis. In women this occurs around the time of menopause. It is important for Diane Gomez Diane Gomez to reach their peak bone mass in order to maintain bone health throughout life. Diane person with high bone mass as Diane Diane Gomez will be more likely to have Diane higher bone mass later in life. Not enough calcium consumption and physical activity early on could result in Diane  failure to achieve optimum bone mass in adulthood. OSTEOPOROSIS Osteoporosis is Diane disease of the bones. It is defined as low bone mass with deterioration of bone structure. Osteoporosis leads to an increase risk of fractures with falls. These fractures commonly happen in the wrist, hip, and spine. While men and women of all ages and background can develop osteoporosis, some of the risk factors for osteoporosis are:  Female.  White.  Postmenopausal.  Older adults.  Small in body size.  Eating Diane diet low in calcium.  Physically inactive.  Smoking.  Use of some medications.  Family history. CALCIUM Calcium is Diane mineral needed by the body for healthy bones, teeth, and proper function of the heart, muscles, and nerves. The body cannot produce calcium so it must be absorbed through food. Good sources of calcium include:  Dairy products (low fat or nonfat milk, cheese, and yogurt).  Dark green leafy vegetables (bok choy and broccoli).  Calcium fortified foods (orange juice, cereal, bread, soy beverages,   and tofu products).  Nuts (almonds). Recommended amounts of calcium vary for individuals. RECOMMENDED CALCIUM INTAKES Age and Amount in mg per day  Children 1 to 3 years / 700 mg  Children 4 to 8 years / 1,000 mg  Children 9 to 13 years / 1,300 mg  Teens 14 to 18 years / 1,300 mg  Adults 19 to 50 years / 1,000 mg  Gomez women 51 to 70 years / 1,200 mg  Adults 71 years and older / 1,200 mg  Pregnant and breastfeeding teens / 1,300 mg  Pregnant and breastfeeding adults / 1,000 mg Vitamin D also plays an important role in healthy bone development. Vitamin D helps in the absorption of calcium. WEIGHT-BEARING PHYSICAL ACTIVITY Regular physical activity has many positive health benefits. Benefits include strong bones. Weight-bearing physical activity early in life is important in reaching peak bone mass. Weight-bearing physical activities cause muscles and bones to work  against gravity. Some examples of weight bearing physical activities include:  Walking, jogging, or running.  Field Hockey.  Jumping rope.  Dancing.  Soccer.  Tennis or Racquetball.  Stair climbing.  Basketball.  Hiking.  Weight lifting.  Aerobic fitness classes. Including weight-bearing physical activity into an exercise plan is Diane great way to keep bones healthy. Adults: Engage in at least 30 minutes of moderate physical activity on most, preferably all, days of the week. Children: Engage in at least 60 minutes of moderate physical activity on most, preferably all, days of the week. FOR MORE INFORMATION [place] Department of Agriculture, Center for Nutrition Policy and Promotion: www.cnpp.usda.gov National Osteoporosis Foundation: www.nof.org Document Released: 08/03/2003 Document Revised: 09/07/2012 Document Reviewed: 11/02/2008 ExitCare Patient Information 2015 [employer]. This information is not intended to replace advice given to you by your health care provider. Make sure you discuss any questions you have with your health care provider.  

## 2014-05-12 LAB — URINALYSIS W MICROSCOPIC + REFLEX CULTURE
BILIRUBIN URINE: NEGATIVE
Bacteria, UA: NONE SEEN
Casts: NONE SEEN
Crystals: NONE SEEN
GLUCOSE, UA: NEGATIVE mg/dL
HGB URINE DIPSTICK: NEGATIVE
KETONES UR: NEGATIVE mg/dL
Leukocytes, UA: NEGATIVE
Nitrite: NEGATIVE
PROTEIN: NEGATIVE mg/dL
SQUAMOUS EPITHELIAL / LPF: NONE SEEN
Specific Gravity, Urine: 1.016 (ref 1.005–1.030)
Urobilinogen, UA: 0.2 mg/dL (ref 0.0–1.0)
pH: 5.5 (ref 5.0–8.0)

## 2014-06-27 DIAGNOSIS — K219 Gastro-esophageal reflux disease without esophagitis: Secondary | ICD-10-CM | POA: Insufficient documentation

## 2014-06-27 DIAGNOSIS — Z85828 Personal history of other malignant neoplasm of skin: Secondary | ICD-10-CM | POA: Insufficient documentation

## 2014-06-27 DIAGNOSIS — R011 Cardiac murmur, unspecified: Secondary | ICD-10-CM | POA: Insufficient documentation

## 2014-06-27 DIAGNOSIS — M810 Age-related osteoporosis without current pathological fracture: Secondary | ICD-10-CM | POA: Insufficient documentation

## 2014-06-27 DIAGNOSIS — D259 Leiomyoma of uterus, unspecified: Secondary | ICD-10-CM | POA: Insufficient documentation

## 2014-06-27 HISTORY — DX: Personal history of other malignant neoplasm of skin: Z85.828

## 2015-04-03 ENCOUNTER — Encounter: Payer: Self-pay | Admitting: Women's Health

## 2015-05-16 ENCOUNTER — Encounter: Payer: Self-pay | Admitting: Women's Health

## 2015-05-16 ENCOUNTER — Other Ambulatory Visit (HOSPITAL_COMMUNITY)
Admission: RE | Admit: 2015-05-16 | Discharge: 2015-05-16 | Disposition: A | Discharge status: Home / Self Care | Payer: 59 | Source: Ambulatory Visit | Attending: Gynecology | Admitting: Gynecology

## 2015-05-16 ENCOUNTER — Ambulatory Visit (INDEPENDENT_AMBULATORY_CARE_PROVIDER_SITE_OTHER): Payer: 59 | Admitting: Women's Health

## 2015-05-16 VITALS — BP 134/80 | Ht 61.0 in | Wt 130.0 lb

## 2015-05-16 DIAGNOSIS — Z01419 Encounter for gynecological examination (general) (routine) without abnormal findings: Secondary | ICD-10-CM | POA: Insufficient documentation

## 2015-05-16 NOTE — Patient Instructions (Signed)
Osteoporosis Osteoporosis is the thinning and loss of density in the bones. Osteoporosis makes the bones more brittle, fragile, and likely to break (fracture). Over time, osteoporosis can cause the bones to become so weak that they fracture after a simple fall. The bones most likely to fracture are the bones in the hip, wrist, and spine. CAUSES  The exact cause is not known. RISK FACTORS Anyone can develop osteoporosis. You may be at greater risk if you have a family history of the condition or have poor nutrition. You may also have a higher risk if you are:   Female.   50 years old or older.  A smoker.  Not physically active.   White or Asian.  Slender. SIGNS AND SYMPTOMS  A fracture might be the first sign of the disease, especially if it results from a fall or injury that would not usually cause a bone to break. Other signs and symptoms include:   Low back and neck pain.  Stooped posture.  Height loss. DIAGNOSIS  To make a diagnosis, your health care provider may:  Take a medical history.  Perform a physical exam.  Order tests, such as:  A bone mineral density test.  A dual-energy X-ray absorptiometry test. TREATMENT  The goal of osteoporosis treatment is to strengthen your bones to reduce your risk of a fracture. Treatment may involve:  Making lifestyle changes, such as:  Eating a diet rich in calcium.  Doing weight-bearing and muscle-strengthening exercises.  Stopping tobacco use.  Limiting alcohol intake.  Taking medicine to slow the process of bone loss or to increase bone density.  Monitoring your levels of calcium and vitamin D. HOME CARE INSTRUCTIONS  Include calcium and vitamin D in your diet. Calcium is important for bone health, and vitamin D helps the body absorb calcium.  Perform weight-bearing and muscle-strengthening exercises as directed by your health care provider.  Do not use any tobacco products, including cigarettes, chewing  tobacco, and electronic cigarettes. If you need help quitting, ask your health care provider.  Limit your alcohol intake.  Take medicines only as directed by your health care provider.  Keep all follow-up visits as directed by your health care provider. This is important.  Take precautions at home to lower your risk of falling, such as:  Keeping rooms well lit and clutter free.  Installing safety rails on stairs.  Using rubber mats in the bathroom and other areas that are often wet or slippery. SEEK IMMEDIATE MEDICAL CARE IF:  You fall or injure yourself.    This information is not intended to replace advice given to you by your health care provider. Make sure you discuss any questions you have with your health care provider.   Document Released: 02/20/2005 Document Revised: 06/03/2014 Document Reviewed: 10/21/2013 Elsevier Interactive Patient Education 2016 [employer] Menopause is a normal process in which your reproductive ability comes to an end. This process happens gradually over a span of months to years, usually between the ages of 48 and 55. Menopause is complete when you have missed 12 consecutive menstrual periods. It is important to talk with your health care provider about some of the most common conditions that affect postmenopausal women, such as heart disease, cancer, and bone loss (osteoporosis). Adopting a healthy lifestyle and getting preventive care can help to promote your health and wellness. Those actions can also lower your chances of developing some of these common conditions. WHAT SHOULD I KNOW ABOUT MENOPAUSE? During menopause, you may experience a   number of symptoms, such as:  Moderate-to-severe hot flashes.  Night sweats.  Decrease in sex drive.  Mood swings.  Headaches.  Tiredness.  Irritability.  Memory problems.  Insomnia. Choosing to treat or not to treat menopausal changes is an individual decision that you make with your health care  provider. WHAT SHOULD I KNOW ABOUT HORMONE REPLACEMENT THERAPY AND SUPPLEMENTS? Hormone therapy products are effective for treating symptoms that are associated with menopause, such as hot flashes and night sweats. Hormone replacement carries certain risks, especially as you become older. If you are thinking about using estrogen or estrogen with progestin treatments, discuss the benefits and risks with your health care provider. WHAT SHOULD I KNOW ABOUT HEART DISEASE AND STROKE? Heart disease, heart attack, and stroke become more likely as you age. This may be due, in part, to the hormonal changes that your body experiences during menopause. These can affect how your body processes dietary fats, triglycerides, and cholesterol. Heart attack and stroke are both medical emergencies. There are many things that you can do to help prevent heart disease and stroke:  Have your blood pressure checked at least every 1-2 years. High blood pressure causes heart disease and increases the risk of stroke.  If you are 55-79 years old, ask your health care provider if you should take aspirin to prevent a heart attack or a stroke.  Do not use any tobacco products, including cigarettes, chewing tobacco, or electronic cigarettes. If you need help quitting, ask your health care provider.  It is important to eat a healthy diet and maintain a healthy weight.  Be sure to include plenty of vegetables, fruits, low-fat dairy products, and lean protein.  Avoid eating foods that are high in solid fats, added sugars, or salt (sodium).  Get regular exercise. This is one of the most important things that you can do for your health.  Try to exercise for at least 150 minutes each week. The type of exercise that you do should increase your heart rate and make you sweat. This is known as moderate-intensity exercise.  Try to do strengthening exercises at least twice each week. Do these in addition to the moderate-intensity  exercise.  Know your numbers.Ask your health care provider to check your cholesterol and your blood glucose. Continue to have your blood tested as directed by your health care provider. WHAT SHOULD I KNOW ABOUT CANCER SCREENING? There are several types of cancer. Take the following steps to reduce your risk and to catch any cancer development as early as possible. Breast Cancer  Practice breast self-awareness.  This means understanding how your breasts normally appear and feel.  It also means doing regular breast self-exams. Let your health care provider know about any changes, no matter how small.  If you are 40 or older, have a clinician do a breast exam (clinical breast exam or CBE) every year. Depending on your age, family history, and medical history, it may be recommended that you also have a yearly breast X-ray (mammogram).  If you have a family history of breast cancer, talk with your health care provider about genetic screening.  If you are at high risk for breast cancer, talk with your health care provider about having an MRI and a mammogram every year.  Breast cancer (BRCA) gene test is recommended for women who have family members with BRCA-related cancers. Results of the assessment will determine the need for genetic counseling and BRCA1 and for BRCA2 testing. BRCA-related cancers include these types:    Breast. This occurs in males or females.  Ovarian.  Tubal. This may also be called fallopian tube cancer.  Cancer of the abdominal or pelvic lining (peritoneal cancer).  Prostate.  Pancreatic. Cervical, Uterine, and Ovarian Cancer Your health care provider may recommend that you be screened regularly for cancer of the pelvic organs. These include your ovaries, uterus, and vagina. This screening involves a pelvic exam, which includes checking for microscopic changes to the surface of your cervix (Pap test).  For women ages 21-65, health care providers may recommend a  pelvic exam and a Pap test every three years. For women ages 76-65, they may recommend the Pap test and pelvic exam, combined with testing for human papilloma virus (HPV), every five years. Some types of HPV increase your risk of cervical cancer. Testing for HPV may also be done on women of any age who have unclear Pap test results.  Other health care providers may not recommend any screening for nonpregnant women who are considered low risk for pelvic cancer and have no symptoms. Ask your health care provider if a screening pelvic exam is right for you.  If you have had past treatment for cervical cancer or a condition that could lead to cancer, you need Pap tests and screening for cancer for at least 20 years after your treatment. If Pap tests have been discontinued for you, your risk factors (such as having a new sexual partner) need to be reassessed to determine if you should start having screenings again. Some women have medical problems that increase the chance of getting cervical cancer. In these cases, your health care provider may recommend that you have screening and Pap tests more often.  If you have a family history of uterine cancer or ovarian cancer, talk with your health care provider about genetic screening.  If you have vaginal bleeding after reaching menopause, tell your health care provider.  There are currently no reliable tests available to screen for ovarian cancer. Lung Cancer Lung cancer screening is recommended for adults 61-18 years old who are at high risk for lung cancer because of a history of smoking. A yearly low-dose CT scan of the lungs is recommended if you:  Currently smoke.  Have a history of at least 30 pack-years of smoking and you currently smoke or have quit within the past 15 years. A pack-year is smoking an average of one pack of cigarettes per day for one year. Yearly screening should:  Continue until it has been 15 years since you quit.  Stop if you  develop a health problem that would prevent you from having lung cancer treatment. Colorectal Cancer  This type of cancer can be detected and can often be prevented.  Routine colorectal cancer screening usually begins at age 28 and continues through age 29.  If you have risk factors for colon cancer, your health care provider may recommend that you be screened at an earlier age.  If you have a family history of colorectal cancer, talk with your health care provider about genetic screening.  Your health care provider may also recommend using home test kits to check for hidden blood in your stool.  A small camera at the end of a tube can be used to examine your colon directly (sigmoidoscopy or colonoscopy). This is done to check for the earliest forms of colorectal cancer.  Direct examination of the colon should be repeated every 5-10 years until age 9. However, if early forms of precancerous polyps or small  growths are found or if you have a family history or genetic risk for colorectal cancer, you may need to be screened more often. Skin Cancer  Check your skin from head to toe regularly.  Monitor any moles. Be sure to tell your health care provider:  About any new moles or changes in moles, especially if there is a change in a mole's shape or color.  If you have a mole that is larger than the size of a pencil eraser.  If any of your family members has a history of skin cancer, especially at a Raechal Raben age, talk with your health care provider about genetic screening.  Always use sunscreen. Apply sunscreen liberally and repeatedly throughout the day.  Whenever you are outside, protect yourself by wearing long sleeves, pants, a wide-brimmed hat, and sunglasses. WHAT SHOULD I KNOW ABOUT OSTEOPOROSIS? Osteoporosis is a condition in which bone destruction happens more quickly than new bone creation. After menopause, you may be at an increased risk for osteoporosis. To help prevent  osteoporosis or the bone fractures that can happen because of osteoporosis, the following is recommended:  If you are 21-6 years old, get at least 1,000 mg of calcium and at least 600 mg of vitamin D per day.  If you are older than age 32 but younger than age 23, get at least 1,200 mg of calcium and at least 600 mg of vitamin D per day.  If you are older than age 75, get at least 1,200 mg of calcium and at least 800 mg of vitamin D per day. Smoking and excessive alcohol intake increase the risk of osteoporosis. Eat foods that are rich in calcium and vitamin D, and do weight-bearing exercises several times each week as directed by your health care provider. WHAT SHOULD I KNOW ABOUT HOW MENOPAUSE AFFECTS [place]? Depression may occur at any age, but it is more common as you become older. Common symptoms of depression include:  Low or sad mood.  Changes in sleep patterns.  Changes in appetite or eating patterns.  Feeling an overall lack of motivation or enjoyment of activities that you previously enjoyed.  Frequent crying spells. Talk with your health care provider if you think that you are experiencing depression. WHAT SHOULD I KNOW ABOUT IMMUNIZATIONS? It is important that you get and maintain your immunizations. These include:  Tetanus, diphtheria, and pertussis (Tdap) booster vaccine.  Influenza every year before the flu season begins.  Pneumonia vaccine.  Shingles vaccine. Your health care provider may also recommend other immunizations.   This information is not intended to replace advice given to you by your health care provider. Make sure you discuss any questions you have with your health care provider.   Document Released: 07/05/2005 Document Revised: 06/03/2014 Document Reviewed: 01/13/2014 Elsevier Interactive Patient Education [employer].

## 2015-05-16 NOTE — Progress Notes (Signed)
Diane Gomez 1956/01/27 [id number]    History:    Presents for annual exam.  Postmenopausal/no HRT/no bleeding. Normal Pap and mammogram history. 2008 myomectomy. Negative colonoscopy at age 59. Overdue for mammogram. DEXA 2013, had Reclast for 4 years.  Hypertension primary care manages labs and meds.  Past medical history, past surgical history, family history and social history were all reviewed and documented in the EPIC chart. 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  ROS:  A ROS was performed and pertinent positives and negatives are included.  Exam:  Filed Vitals:   05/16/15 0839  BP: 134/80    General appearance:  Normal Thyroid:  Symmetrical, normal in size, without palpable masses or nodularity. Respiratory  Auscultation:  Clear without wheezing or rhonchi Cardiovascular  Auscultation:  Regular rate, without rubs, murmurs or gallops  Edema/varicosities:  Not grossly evident Abdominal  Soft,nontender, without masses, guarding or rebound.  Liver/spleen:  No organomegaly noted  Hernia:  None appreciated  Skin  Inspection:  Grossly normal   Breasts: Examined lying and sitting.     Right: Without masses, retractions, discharge or axillary adenopathy.     Left: Without masses, retractions, discharge or axillary adenopathy. Gentitourinary   Inguinal/mons:  Normal without inguinal adenopathy  External genitalia:  Normal  BUS/Urethra/Skene's glands:  Normal  Vagina:  Normal  Cervix:  Normal  Uterus:   normal in size, shape and contour.  Midline and mobile  Adnexa/parametria:     Rt: Without masses or tenderness.   Lt: Without masses or tenderness.  Anus and perineum: Normal  Digital rectal exam: Normal sphincter tone without palpated masses or tenderness  Assessment/Plan:  59 y.o. DW F G2 P2 for annual exam with no complaints.  Postmenopausal/no HRT/no bleeding Reclast 2010-2014 Hypertension-primary care  manages labs and meds  Plan: Schedule DEXA. Home safety, fall prevention and importance of weightbearing exercise reviewed. Instructed to have vitamin D level checked with next lab draw. Overdue for mammogram reviewed importance of annual screening, instructed to schedule. Overdue for colonoscopy instructed to schedule. Calcium rich foods, exercise encouraged. Pap with HR HPV typing, new screening guidelines reviewed. Zostavax recommended.    [place], 1:53 PM 05/16/2015

## 2015-05-17 LAB — CYTOLOGY - PAP

## 2016-05-16 ENCOUNTER — Ambulatory Visit (INDEPENDENT_AMBULATORY_CARE_PROVIDER_SITE_OTHER): Payer: BLUE CROSS/BLUE SHIELD | Admitting: Women's Health

## 2016-05-16 ENCOUNTER — Encounter: Payer: Self-pay | Admitting: Women's Health

## 2016-05-16 VITALS — BP 120/78 | Ht 61.0 in | Wt 131.0 lb

## 2016-05-16 DIAGNOSIS — Z13820 Encounter for screening for osteoporosis: Secondary | ICD-10-CM | POA: Diagnosis not present

## 2016-05-16 DIAGNOSIS — Z01419 Encounter for gynecological examination (general) (routine) without abnormal findings: Secondary | ICD-10-CM | POA: Diagnosis not present

## 2016-05-16 LAB — URINALYSIS W MICROSCOPIC + REFLEX CULTURE
BACTERIA UA: NONE SEEN [HPF]
Bilirubin Urine: NEGATIVE
Casts: NONE SEEN [LPF]
Crystals: NONE SEEN [HPF]
GLUCOSE, UA: NEGATIVE
Hgb urine dipstick: NEGATIVE
LEUKOCYTES UA: NEGATIVE
Nitrite: NEGATIVE
PROTEIN: NEGATIVE
SPECIFIC GRAVITY, URINE: 1.019 (ref 1.001–1.035)
YEAST: NONE SEEN [HPF]
pH: 5.5 (ref 5.0–8.0)

## 2016-05-16 NOTE — Patient Instructions (Signed)
zostavac vaccine Mammogram  [phone]  Health Maintenance for Postmenopausal Women Introduction Menopause is a normal process in which your reproductive ability comes to an end. This process happens gradually over a span of months to years, usually between the ages of 11 and 42. Menopause is complete when you have missed 12 consecutive menstrual periods. It is important to talk with your health care provider about some of the most common conditions that affect postmenopausal women, such as heart disease, cancer, and bone loss (osteoporosis). Adopting a healthy lifestyle and getting preventive care can help to promote your health and wellness. Those actions can also lower your chances of developing some of these common conditions. What should I know about menopause? During menopause, you may experience a number of symptoms, such as:  Moderate-to-severe hot flashes.  Night sweats.  Decrease in sex drive.  Mood swings.  Headaches.  Tiredness.  Irritability.  Memory problems.  Insomnia. Choosing to treat or not to treat menopausal changes is an individual decision that you make with your health care provider. What should I know about hormone replacement therapy and supplements? Hormone therapy products are effective for treating symptoms that are associated with menopause, such as hot flashes and night sweats. Hormone replacement carries certain risks, especially as you become older. If you are thinking about using estrogen or estrogen with progestin treatments, discuss the benefits and risks with your health care provider. What should I know about heart disease and stroke? Heart disease, heart attack, and stroke become more likely as you age. This may be due, in part, to the hormonal changes that your body experiences during menopause. These can affect how your body processes dietary fats, triglycerides, and cholesterol. Heart attack and stroke are both medical emergencies. There are many  things that you can do to help prevent heart disease and stroke:  Have your blood pressure checked at least every 1-2 years. High blood pressure causes heart disease and increases the risk of stroke.  If you are 66-7 years old, ask your health care provider if you should take aspirin to prevent a heart attack or a stroke.  Do not use any tobacco products, including cigarettes, chewing tobacco, or electronic cigarettes. If you need help quitting, ask your health care provider.  It is important to eat a healthy diet and maintain a healthy weight.  Be sure to include plenty of vegetables, fruits, low-fat dairy products, and lean protein.  Avoid eating foods that are high in solid fats, added sugars, or salt (sodium).  Get regular exercise. This is one of the most important things that you can do for your health.  Try to exercise for at least 150 minutes each week. The type of exercise that you do should increase your heart rate and make you sweat. This is known as moderate-intensity exercise.  Try to do strengthening exercises at least twice each week. Do these in addition to the moderate-intensity exercise.  Know your numbers.Ask your health care provider to check your cholesterol and your blood glucose. Continue to have your blood tested as directed by your health care provider. What should I know about cancer screening? There are several types of cancer. Take the following steps to reduce your risk and to catch any cancer development as early as possible. Breast Cancer  Practice breast self-awareness.  This means understanding how your breasts normally appear and feel.  It also means doing regular breast self-exams. Let your health care provider know about any changes, no matter how small.  If you are 39 or older, have a clinician do a breast exam (clinical breast exam or CBE) every year. Depending on your age, family history, and medical history, it may be recommended that you also  have a yearly breast X-ray (mammogram).  If you have a family history of breast cancer, talk with your health care provider about genetic screening.  If you are at high risk for breast cancer, talk with your health care provider about having an MRI and a mammogram every year.  Breast cancer (BRCA) gene test is recommended for women who have family members with BRCA-related cancers. Results of the assessment will determine the need for genetic counseling and BRCA1 and for BRCA2 testing. BRCA-related cancers include these types:  Breast. This occurs in males or females.  Ovarian.  Tubal. This may also be called fallopian tube cancer.  Cancer of the abdominal or pelvic lining (peritoneal cancer).  Prostate.  Pancreatic. Cervical, Uterine, and Ovarian Cancer  Your health care provider may recommend that you be screened regularly for cancer of the pelvic organs. These include your ovaries, uterus, and vagina. This screening involves a pelvic exam, which includes checking for microscopic changes to the surface of your cervix (Pap test).  For women ages 21-65, health care providers may recommend a pelvic exam and a Pap test every three years. For women ages 25-65, they may recommend the Pap test and pelvic exam, combined with testing for human papilloma virus (HPV), every five years. Some types of HPV increase your risk of cervical cancer. Testing for HPV may also be done on women of any age who have unclear Pap test results.  Other health care providers may not recommend any screening for nonpregnant women who are considered low risk for pelvic cancer and have no symptoms. Ask your health care provider if a screening pelvic exam is right for you.  If you have had past treatment for cervical cancer or a condition that could lead to cancer, you need Pap tests and screening for cancer for at least 20 years after your treatment. If Pap tests have been discontinued for you, your risk factors (such as  having a new sexual partner) need to be reassessed to determine if you should start having screenings again. Some women have medical problems that increase the chance of getting cervical cancer. In these cases, your health care provider may recommend that you have screening and Pap tests more often.  If you have a family history of uterine cancer or ovarian cancer, talk with your health care provider about genetic screening.  If you have vaginal bleeding after reaching menopause, tell your health care provider.  There are currently no reliable tests available to screen for ovarian cancer. Lung Cancer  Lung cancer screening is recommended for adults 16-40 years old who are at high risk for lung cancer because of a history of smoking. A yearly low-dose CT scan of the lungs is recommended if you:  Currently smoke.  Have a history of at least 30 pack-years of smoking and you currently smoke or have quit within the past 15 years. A pack-year is smoking an average of one pack of cigarettes per day for one year. Yearly screening should:  Continue until it has been 15 years since you quit.  Stop if you develop a health problem that would prevent you from having lung cancer treatment. Colorectal Cancer  This type of cancer can be detected and can often be prevented.  Routine colorectal cancer screening usually  begins at age 21 and continues through age 31.  If you have risk factors for colon cancer, your health care provider may recommend that you be screened at an earlier age.  If you have a family history of colorectal cancer, talk with your health care provider about genetic screening.  Your health care provider may also recommend using home test kits to check for hidden blood in your stool.  A small camera at the end of a tube can be used to examine your colon directly (sigmoidoscopy or colonoscopy). This is done to check for the earliest forms of colorectal cancer.  Direct examination of  the colon should be repeated every 5-10 years until age 48. However, if early forms of precancerous polyps or small growths are found or if you have a family history or genetic risk for colorectal cancer, you may need to be screened more often. Skin Cancer  Check your skin from head to toe regularly.  Monitor any moles. Be sure to tell your health care provider:  About any new moles or changes in moles, especially if there is a change in a mole's shape or color.  If you have a mole that is larger than the size of a pencil eraser.  If any of your family members has a history of skin cancer, especially at a young age, talk with your health care provider about genetic screening.  Always use sunscreen. Apply sunscreen liberally and repeatedly throughout the day.  Whenever you are outside, protect yourself by wearing long sleeves, pants, a wide-brimmed hat, and sunglasses. What should I know about osteoporosis? Osteoporosis is a condition in which bone destruction happens more quickly than new bone creation. After menopause, you may be at an increased risk for osteoporosis. To help prevent osteoporosis or the bone fractures that can happen because of osteoporosis, the following is recommended:  If you are 74-38 years old, get at least 1,000 mg of calcium and at least 600 mg of vitamin D per day.  If you are older than age 74 but younger than age 56, get at least 1,200 mg of calcium and at least 600 mg of vitamin D per day.  If you are older than age 66, get at least 1,200 mg of calcium and at least 800 mg of vitamin D per day. Smoking and excessive alcohol intake increase the risk of osteoporosis. Eat foods that are rich in calcium and vitamin D, and do weight-bearing exercises several times each week as directed by your health care provider. What should I know about how menopause affects my mental health? Depression may occur at any age, but it is more common as you become older. Common  symptoms of depression include:  Low or sad mood.  Changes in sleep patterns.  Changes in appetite or eating patterns.  Feeling an overall lack of motivation or enjoyment of activities that you previously enjoyed.  Frequent crying spells. Talk with your health care provider if you think that you are experiencing depression. What should I know about immunizations? It is important that you get and maintain your immunizations. These include:  Tetanus, diphtheria, and pertussis (Tdap) booster vaccine.  Influenza every year before the flu season begins.  Pneumonia vaccine.  Shingles vaccine. Your health care provider may also recommend other immunizations. This information is not intended to replace advice given to you by your health care provider. Make sure you discuss any questions you have with your health care provider. Document Released: 07/05/2005 Document Revised: 12/01/2015  Document Reviewed: 02/14/2015  2017 Elsevier

## 2016-05-16 NOTE — Progress Notes (Signed)
Diane Gomez 11-04-1955 [id number]    History:    Presents for annual exam.  Postmenopausal on no HRT with no bleeding. Normal Pap and mammogram history. Osteopenia, had been on Reclast for 4 years. 2008 myomectomy. Negative colonoscopy at age 60. Has not had Zostavax.Arlean Hopping for mammogram.  Past medical history, past surgical history, family history and social history were all reviewed and documented in the EPIC chart. 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 Helping to pay for her grandson at [facility] [employer] [employer].  ROS:  A ROS was performed and pertinent positives and negatives are included.  Exam:  Vitals:   05/16/16 0831  BP: 120/78  Weight: 131 lb (59.4 kg)  Height: 5\' 1"  (1.549 m)   Body mass index is 24.75 kg/m.   General appearance:  Normal Thyroid:  Symmetrical, normal in size, without palpable masses or nodularity. Respiratory  Auscultation:  Clear without wheezing or rhonchi Cardiovascular  Auscultation:  Regular rate, without rubs, murmurs or gallops  Edema/varicosities:  Not grossly evident Abdominal  Soft,nontender, without masses, guarding or rebound.  Liver/spleen:  No organomegaly noted  Hernia:  None appreciated  Skin  Inspection:  Grossly normal   Breasts: Examined lying and sitting.     Right: Without masses, retractions, discharge or axillary adenopathy.     Left: Without masses, retractions, discharge or axillary adenopathy. Gentitourinary   Inguinal/mons:  Normal without inguinal adenopathy  External genitalia:  Normal  BUS/Urethra/Skene's glands:  Normal  Vagina:  Atrophic  Cervix:  Normal  Uterus:   normal in size, shape and contour.  Midline and mobile  Adnexa/parametria:     Rt: Without masses or tenderness.   Lt: Without masses or tenderness.  Anus and perineum: Normal  Digital rectal exam: Normal sphincter tone without palpated masses or tenderness  Assessment/Plan:  60 y.o.SWF G4P2  for annual exam with no  complaints.  Postmenopausal/no HRT/no bleeding Osteopenia Reclast 4 years Overdue for health screenings-mammogram, colonoscopy Hypertension primary care manages labs and meds  Plan: SBE's, reviewed importance of annual screening mammogram instructed to schedule. Calcium rich diet, vitamin D 1000 daily encouraged. Home safety, fall prevention and importance of weightbearing exercise reviewed. Schedule DEXA. Reviewed importance of screening colonoscopy instructed to schedule. Zostavax recommended coverage to get a primary care. UA, Pap normal 2016, new screening guidelines reviewed.  Huel Cote [facility], 8:55 AM 05/16/2016

## 2016-05-18 LAB — URINE CULTURE

## 2016-10-09 ENCOUNTER — Encounter: Payer: Self-pay | Admitting: Gynecology

## 2016-10-14 ENCOUNTER — Encounter: Payer: Self-pay | Admitting: Gastroenterology

## 2016-11-29 ENCOUNTER — Ambulatory Visit (AMBULATORY_SURGERY_CENTER): Payer: Self-pay | Admitting: *Deleted

## 2016-11-29 ENCOUNTER — Telehealth: Payer: Self-pay | Admitting: *Deleted

## 2016-11-29 VITALS — Ht 61.0 in | Wt 123.0 lb

## 2016-11-29 DIAGNOSIS — Z1211 Encounter for screening for malignant neoplasm of colon: Secondary | ICD-10-CM

## 2016-11-29 DIAGNOSIS — R011 Cardiac murmur, unspecified: Secondary | ICD-10-CM | POA: Insufficient documentation

## 2016-11-29 DIAGNOSIS — K219 Gastro-esophageal reflux disease without esophagitis: Secondary | ICD-10-CM | POA: Insufficient documentation

## 2016-11-29 MED ORDER — NA SULFATE-K SULFATE-MG SULF 17.5-3.13-1.6 GM/177ML PO SOLN: ORAL | 0 refills | Status: DC

## 2016-11-29 NOTE — Progress Notes (Signed)
Patient denies any allergies to eggs or soy. Patient denies any problems with anesthesia/sedation. Patient denies any oxygen use at home and does not take any diet/weight loss medications. No email per pt. 

## 2016-11-29 NOTE — Telephone Encounter (Signed)
Patient states last colon was 2007 at [facility], she states she had polyps. Release of information form filled out and given to [place].

## 2016-12-02 NOTE — Telephone Encounter (Signed)
Im in Neurology today, as of 5 pm on 7/6 I never seen release come to me.. Will check when Im back in GI on Tuesday

## 2016-12-11 ENCOUNTER — Ambulatory Visit (AMBULATORY_SURGERY_CENTER): Payer: BLUE CROSS/BLUE SHIELD | Admitting: Gastroenterology

## 2016-12-11 ENCOUNTER — Encounter: Payer: Self-pay | Admitting: Gastroenterology

## 2016-12-11 VITALS — BP 149/79 | HR 67 | Temp 97.3°F | Resp 14 | Ht 61.0 in | Wt 123.0 lb

## 2016-12-11 DIAGNOSIS — K635 Polyp of colon: Secondary | ICD-10-CM | POA: Diagnosis not present

## 2016-12-11 DIAGNOSIS — Z1211 Encounter for screening for malignant neoplasm of colon: Secondary | ICD-10-CM | POA: Diagnosis present

## 2016-12-11 DIAGNOSIS — D125 Benign neoplasm of sigmoid colon: Secondary | ICD-10-CM

## 2016-12-11 DIAGNOSIS — Z1212 Encounter for screening for malignant neoplasm of rectum: Secondary | ICD-10-CM | POA: Diagnosis not present

## 2016-12-11 DIAGNOSIS — D123 Benign neoplasm of transverse colon: Secondary | ICD-10-CM | POA: Diagnosis not present

## 2016-12-11 MED ORDER — SODIUM CHLORIDE 0.9 % IV SOLN
500.0000 mL | INTRAVENOUS | Status: AC
Start: 2016-12-11 — End: 2017-12-11

## 2016-12-11 NOTE — Patient Instructions (Signed)
Handout given on polyps  YOU HAD AN ENDOSCOPIC PROCEDURE TODAY: Refer to the procedure report and other information in the discharge instructions given to you for any specific questions about what was found during the examination. If this information does not answer your questions, please call [place] office at [phone] to clarify.   YOU SHOULD EXPECT: Some feelings of bloating in the abdomen. Passage of more gas than usual. Walking can help get rid of the air that was put into your GI tract during the procedure and reduce the bloating. If you had a lower endoscopy (such as a colonoscopy or flexible sigmoidoscopy) you may notice spotting of blood in your stool or on the toilet paper. Some abdominal soreness may be present for a day or two, also.  DIET: Your first meal following the procedure should be a light meal and then it is ok to progress to your normal diet. A half-sandwich or bowl of soup is an example of a good first meal. Heavy or fried foods are harder to digest and may make you feel nauseous or bloated. Drink plenty of fluids but you should avoid alcoholic beverages for 24 hours. If you had a esophageal dilation, please see attached instructions for diet.    ACTIVITY: Your care partner should take you home directly after the procedure. You should plan to take it easy, moving slowly for the rest of the day. You can resume normal activity the day after the procedure however YOU SHOULD NOT DRIVE, use power tools, machinery or perform tasks that involve climbing or major physical exertion for 24 hours (because of the sedation medicines used during the test).   SYMPTOMS TO REPORT IMMEDIATELY: A gastroenterologist can be reached at any hour. Please call [phone]  for any of the following symptoms:  Following lower endoscopy (colonoscopy, flexible sigmoidoscopy) Excessive amounts of blood in the stool  Significant tenderness, worsening of abdominal pains  Swelling of the abdomen that is  new, acute  Fever of 100 or higher    FOLLOW UP:  If any biopsies were taken you will be contacted by phone or by letter within the next 1-3 weeks. Call [phone]  if you have not heard about the biopsies in 3 weeks.  Please also call with any specific questions about appointments or follow up tests.  

## 2016-12-11 NOTE — Progress Notes (Signed)
To PACU, vss patent aw report to rn 

## 2016-12-11 NOTE — Telephone Encounter (Signed)
Spoke with Robin,CMA today. She states the records are on Dr.Nandigam desk for review.

## 2016-12-11 NOTE — Op Note (Signed)
[facility] Patient Name: Diane Gomez Procedure Date: 12/11/2016 8:41 AM MRN: [id number] Endoscopist: Mauri Pole , MD Age: 61 Referring MD:  Date of Birth: June 23, 1955 Gender: Female Account #: [id number] Procedure:                Colonoscopy Indications:              Screening for colorectal malignant neoplasm Medicines:                Monitored Anesthesia Care Procedure:                Pre-Anesthesia Assessment:                           - Prior to the procedure, a History and Physical                            was performed, and patient medications and                            allergies were reviewed. The patient's tolerance of                            previous anesthesia was also reviewed. The risks                            and benefits of the procedure and the sedation                            options and risks were discussed with the patient.                            All questions were answered, and informed consent                            was obtained. Prior Anticoagulants: The patient has                            taken no previous anticoagulant or antiplatelet                            agents. ASA Grade Assessment: II - A patient with                            mild systemic disease. After reviewing the risks                            and benefits, the patient was deemed in                            satisfactory condition to undergo the procedure.                           After obtaining informed consent, the colonoscope  was passed under direct vision. Throughout the                            procedure, the patient's blood pressure, pulse, and                            oxygen saturations were monitored continuously. The                            Colonoscope was introduced through the anus and                            advanced to the the cecum, identified by                            appendiceal orifice  and ileocecal valve. The                            colonoscopy was performed without difficulty. The                            patient tolerated the procedure well. The quality                            of the bowel preparation was adequate. The                            ileocecal valve, appendiceal orifice, and rectum                            were photographed. Scope In: 8:46:51 AM Scope Out: 9:06:23 AM Scope Withdrawal Time: 0 hours 14 minutes 31 seconds  Total Procedure Duration: 0 hours 19 minutes 32 seconds  Findings:                 The perianal and digital rectal examinations were                            normal.                           A 5 mm polyp was found in the transverse colon. The                            polyp was sessile. The polyp was removed with a                            cold snare. Resection and retrieval were complete.                           A 10 mm polyp was found in the sigmoid colon. The                            polyp was sessile. The polyp was removed with a hot  snare. Resection and retrieval were complete.                           Multiple small and large-mouthed diverticula were                            found in the sigmoid colon and descending colon.                           Non-bleeding internal hemorrhoids were found during                            retroflexion. The hemorrhoids were small. Complications:            No immediate complications. Estimated Blood Loss:     Estimated blood loss was minimal. Impression:               - One 5 mm polyp in the transverse colon, removed                            with a cold snare. Resected and retrieved.                           - One 10 mm polyp in the sigmoid colon, removed                            with a hot snare. Resected and retrieved.                           - Diverticulosis in the sigmoid colon and in the                            descending colon.                            - Non-bleeding internal hemorrhoids. Recommendation:           - Patient has a contact number available for                            emergencies. The signs and symptoms of potential                            delayed complications were discussed with the                            patient. Return to normal activities tomorrow.                            Written discharge instructions were provided to the                            patient.                           - Resume previous diet.                           -  Continue present medications.                           - Await pathology results.                           - Repeat colonoscopy in 3 - 5 years for                            surveillance based on pathology results. Mauri Pole, MD 12/11/2016 9:08:24 AM This report has been signed electronically.

## 2016-12-11 NOTE — Progress Notes (Signed)
Called to room to assist during endoscopic procedure.  Patient ID and intended procedure confirmed with present staff. Received instructions for my participation in the procedure from the performing physician.  

## 2016-12-12 ENCOUNTER — Encounter: Payer: Self-pay | Admitting: Internal Medicine

## 2016-12-12 ENCOUNTER — Telehealth: Payer: Self-pay | Admitting: *Deleted

## 2016-12-12 ENCOUNTER — Ambulatory Visit (INDEPENDENT_AMBULATORY_CARE_PROVIDER_SITE_OTHER): Payer: BLUE CROSS/BLUE SHIELD | Admitting: Internal Medicine

## 2016-12-12 ENCOUNTER — Telehealth: Payer: Self-pay | Admitting: Cardiology

## 2016-12-12 VITALS — BP 130/84 | HR 64 | Temp 98.3°F | Ht 61.5 in | Wt 121.8 lb

## 2016-12-12 DIAGNOSIS — M810 Age-related osteoporosis without current pathological fracture: Secondary | ICD-10-CM | POA: Diagnosis not present

## 2016-12-12 DIAGNOSIS — K219 Gastro-esophageal reflux disease without esophagitis: Secondary | ICD-10-CM

## 2016-12-12 DIAGNOSIS — Z85828 Personal history of other malignant neoplasm of skin: Secondary | ICD-10-CM

## 2016-12-12 DIAGNOSIS — J4520 Mild intermittent asthma, uncomplicated: Secondary | ICD-10-CM

## 2016-12-12 DIAGNOSIS — J45909 Unspecified asthma, uncomplicated: Secondary | ICD-10-CM | POA: Insufficient documentation

## 2016-12-12 DIAGNOSIS — M545 Low back pain, unspecified: Secondary | ICD-10-CM

## 2016-12-12 DIAGNOSIS — I10 Essential (primary) hypertension: Secondary | ICD-10-CM | POA: Diagnosis not present

## 2016-12-12 DIAGNOSIS — F419 Anxiety disorder, unspecified: Secondary | ICD-10-CM

## 2016-12-12 LAB — POC URINALSYSI DIPSTICK (AUTOMATED)
BILIRUBIN UA: NEGATIVE
GLUCOSE UA: NEGATIVE
KETONES UA: NEGATIVE
Leukocytes, UA: NEGATIVE
NITRITE UA: NEGATIVE
PH UA: 6 (ref 5.0–8.0)
RBC UA: NEGATIVE
SPEC GRAV UA: 1.025 (ref 1.010–1.025)
Urobilinogen, UA: 0.2 E.U./dL

## 2016-12-12 MED ORDER — LOSARTAN POTASSIUM 100 MG PO TABS: 100.0000 mg | ORAL_TABLET | Freq: Every day | ORAL | 0 refills | Status: DC

## 2016-12-12 NOTE — Assessment & Plan Note (Signed)
She is past due for a repeat bone density Will discuss this at her annual exam Continue Vit D and daily weight bearing exercise

## 2016-12-12 NOTE — Patient Instructions (Signed)

## 2016-12-12 NOTE — Telephone Encounter (Signed)
Pt called stated her valsartan has been manufactured in [place]  Best number [phone]

## 2016-12-12 NOTE — Assessment & Plan Note (Signed)
Avoid allergens She declines RX for Albuterol at this time

## 2016-12-12 NOTE — Assessment & Plan Note (Signed)
Chronic but stable Ok to continue Xanax prn given such rare use

## 2016-12-12 NOTE — Progress Notes (Signed)
HPI  Pt presents to the clinic today to establish care and for management of the conditions listed below. She has not had a PCP in many years.  Anxiety: Triggered by the just life stress. She recently just ended a 12 year relationship. She also just lost her brother in law. She is prescribed Xanax and reports she only takes it about 2 x month.  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 She does not use an Albuterol inhaler.  History of Skin Cancer: s/p excision. She did not have chemo or radiation. She is no longer following with dermatology.  GERD: Triggered by spicy foods. She takes Nexium daily and denies breakthrough symptoms.  HTN: Her BP today is 130/84. She is taking Metoprolol, Diltiazem and Valsartan. She follows with Dr. Saralyn Pilar.  Osteoporosis: Her last bone density was 2013. She did take 4 injections through her GYN. She is taking Vit D daily.  She also c/o bilateral low back pain. She reports this started about 1 month ago. She describes the pain as achy. The pain does not radiate. She denies numbness or tingling in her legs. She denies urinary or vaginal complaints. She has not taken anything OTC for this.   Flu: 02/2016 Tetanus: 04/2014 Zostovax: never Shingrix: never Pap Smear: 04/2015 Mammogram: 2010 Bone Density Exam: 12/2011 Colon Screening: 11/2016 Vision Screening: as needed Dentist: as needed  Past Medical History:  Diagnosis Date  . Anxiety   . Asthma    from cats and horses only  . Cancer ([place]) 15 years ago   melanoma   . Fibroid    SUBMUCOUS MYOMA  . GERD (gastroesophageal reflux disease)   . Heart murmur   . Hypertension   . Osteoporosis     Current Outpatient Prescriptions  Medication Sig Dispense Refill  . Calcium Carbonate-Vitamin D (CALCIUM + D PO) Take 1 tablet by mouth daily.     Marland Kitchen diltiazem (TIAZAC) 300 MG 24 hr capsule Take 300 mg by mouth daily.     Marland Kitchen esomeprazole (NEXIUM) 40 MG capsule Take 20 mg by mouth daily before  breakfast.     . metoprolol succinate (TOPROL-XL) 50 MG 24 hr tablet Take 2 tablets by mouth daily.     . valsartan (DIOVAN) 160 MG tablet Take 160 mg by mouth daily.    . Cyanocobalamin (B-12 PO) Take 1 tablet by mouth daily.      Current Facility-Administered Medications  Medication Dose Route Frequency Provider Last Rate Last Dose  . 0.9 %  sodium chloride infusion  500 mL Intravenous Continuous Nandigam, Venia Minks, MD        Allergies  Allergen Reactions  . Penicillins Hives  . Other     Cats and horses    Family History  Problem Relation Age of Onset  . Hypertension Mother   . Parkinsonism Sister        PARKINSON'S DISEASE  . Breast cancer Other        Niece  Age 78  . Aneurysm Father   . Seizures Sister   . Heart disease Brother   . Colon cancer Neg Hx   . Stomach cancer Neg Hx   . Esophageal cancer Neg Hx     Social History   Social History  . Marital status: Married    Spouse name: N/A  . Number of children: N/A  . Years of education: N/A   Occupational History  . Not on file.   Social History Main Topics  . Smoking  status: Former Smoker    Types: Cigarettes  . Smokeless tobacco: Never Used  . Alcohol use 8.4 oz/week    7 Shots of liquor, 7 Standard drinks or equivalent per week     Comment: moderate..."at least one drink per day" Vodka per pt  . Drug use: Yes    Types: Marijuana     Comment: occasional   . Sexual activity: Yes    Birth control/ protection: Post-menopausal, Surgical   Other Topics Concern  . Not on file   Social History Narrative  . No narrative on file    ROS:  Constitutional: Denies fever, malaise, fatigue, headache or abrupt weight changes.  HEENT: Denies eye pain, eye redness, ear pain, ringing in the ears, wax buildup, runny nose, nasal congestion, bloody nose, or sore throat. Respiratory: Denies difficulty breathing, shortness of breath, cough or sputum production.   Cardiovascular: Denies chest pain, chest tightness,  palpitations or swelling in the hands or feet.  Gastrointestinal: Denies abdominal pain, bloating, constipation, diarrhea or blood in the stool.  GU: Denies frequency, urgency, pain with urination, blood in urine, odor or discharge. Musculoskeletal: Pt reports bilateral low back/flank pain. Denies decrease in range of motion, difficulty with gait, or joint pain and swelling.  Skin: Denies redness, rashes, lesions or ulcercations.  Neurological: Denies dizziness, difficulty with memory, difficulty with speech or problems with balance and coordination.  Psych: Pt has history of anxiety. Denies depression, SI/HI.  No other specific complaints in a complete review of systems (except as listed in HPI above).  PE:  BP 130/84   Pulse 64   Temp 98.3 F (36.8 C) (Oral)   Ht 5' 1.5" (1.562 m)   Wt 121 lb 12.8 oz (55.2 kg)   SpO2 97%   BMI 22.64 kg/m   Wt Readings from Last 3 Encounters:  12/12/16 121 lb 12.8 oz (55.2 kg)  12/11/16 123 lb (55.8 kg)  11/29/16 123 lb (55.8 kg)    General: Appears her stated age, well developed, well nourished in NAD. HEENT: Head: normal shape and size; Ears: Tm's gray and intact, normal light reflex;Throat/Mouth: Teeth present, mucosa pink and moist, no lesions or ulcerations noted.  Cardiovascular: Normal rate and rhythm. S1,S2 noted.  Faint murmur noted.  Pulmonary/Chest: Normal effort and positive vesicular breath sounds. No respiratory distress. No wheezes, rales or ronchi noted.  Abdomen: Soft and nontender. Normal bowel sounds. No distention or masses noted. No CVA tenderness noted. Musculoskeletal: Normal flexion, extension and rotation of the spine. Pain with rotation. No bony tenderness noted over the spine. No pain with palpation of the paralumbar muscles. No difficulty with gait.  Neurological: Alert and oriented.   Psychiatric: Mood and affect normal. Behavior is normal. Judgment and thought content normal.    BMET    Component Value Date/Time    NA 137 07/08/2013 1149   K 3.3 (L) 07/08/2013 1149   CL 105 07/08/2013 1149   CO2 22 07/08/2013 1149   GLUCOSE 95 07/08/2013 1149   BUN 8 07/08/2013 1149   CREATININE 0.66 07/08/2013 1149   CALCIUM 10.0 07/08/2013 1149   GFRNONAA >60 07/08/2013 1149   GFRAA >60 07/08/2013 1149    Lipid Panel     Component Value Date/Time   CHOL 237 (H) 10/21/2012 0844   TRIG 137 10/21/2012 0844   HDL 103 10/21/2012 0844   CHOLHDL 2.3 10/21/2012 0844   VLDL 27 10/21/2012 0844   LDLCALC 107 (H) 10/21/2012 0844    CBC  Component Value Date/Time   WBC 7.2 07/08/2013 1149   WBC 3.7 (L) 10/21/2012 0844   RBC 4.37 07/08/2013 1149   RBC 4.16 10/21/2012 0844   HGB 14.3 07/08/2013 1149   HCT 42.1 07/08/2013 1149   PLT 253 07/08/2013 1149   MCV 96 07/08/2013 1149   MCH 32.8 07/08/2013 1149   MCH 32.2 10/21/2012 0844   MCHC 34.0 07/08/2013 1149   MCHC 36.1 (H) 10/21/2012 0844   RDW 14.3 07/08/2013 1149   LYMPHSABS 1.1 10/21/2012 0844   MONOABS 0.4 10/21/2012 0844   EOSABS 0.1 10/21/2012 0844   BASOSABS 0.0 10/21/2012 0844    Hgb A1C No results found for: HGBA1C   Assessment and Plan:  Low Back Pain:  Seems muscular Discussed heat and stretching Urinalysis: 2+ leuks, + protein Will send urine culture  Push fluids  Make an appt for your annual exam Webb Silversmith, NP

## 2016-12-12 NOTE — Telephone Encounter (Signed)
  Follow up Call-  Call back number 12/11/2016  Post procedure Call Back phone  # [phone]  Permission to leave phone message Yes  Some recent data might be hidden     Patient questions:  Do you have a fever, pain , or abdominal swelling? No. Pain Score  0 *  Have you tolerated food without any problems? Yes.    Have you been able to return to your normal activities? Yes.    Do you have any questions about your discharge instructions: Diet   No. Medications  No. Follow up visit  No.  Do you have questions or concerns about your Care? No.  Actions: * If pain score is 4 or above: No action needed, pain <4.

## 2016-12-12 NOTE — Telephone Encounter (Signed)
Pt is aware as instructed... Rx sent through e-scribe... F/u appt scheduled

## 2016-12-12 NOTE — Assessment & Plan Note (Signed)
Mildly elevated today Managed by Dr. Saralyn Pilar Continue Diltiazem, Metoprolol and Valsartan She will call me back with the manufacturer of her Valsartan, in case we need to change the medication given the recent FDA recommendations

## 2016-12-12 NOTE — Addendum Note (Signed)
Addended by: Lurlean Nanny on: 12/12/2016 03:09 PM   Modules accepted: Orders

## 2016-12-12 NOTE — Assessment & Plan Note (Signed)
She does not follow with dermatology.  Will monitor

## 2016-12-12 NOTE — Telephone Encounter (Signed)
Change to Losartan 100 mg daily #30, 0 refills. Have her make an appt with me to recheck BP in 2 weeks

## 2016-12-12 NOTE — Addendum Note (Signed)
Addended by: Lurlean Nanny on: 12/12/2016 05:24 PM   Modules accepted: Orders

## 2016-12-12 NOTE — Assessment & Plan Note (Signed)
Advised her to avoid spicy foods Continue Nexium for now

## 2016-12-13 LAB — URINE CULTURE

## 2016-12-22 ENCOUNTER — Encounter: Payer: Self-pay | Admitting: Gastroenterology

## 2016-12-27 ENCOUNTER — Other Ambulatory Visit: Payer: Self-pay | Admitting: Internal Medicine

## 2016-12-30 ENCOUNTER — Other Ambulatory Visit: Payer: Self-pay | Admitting: Internal Medicine

## 2016-12-30 NOTE — Telephone Encounter (Signed)
Chart indicates pt has new PCP. pls advise

## 2016-12-31 ENCOUNTER — Other Ambulatory Visit: Payer: Self-pay | Admitting: Internal Medicine

## 2016-12-31 NOTE — Telephone Encounter (Signed)
No new PCP, pt just est care with [facility] and the providers name in chart is her cardiologist

## 2017-01-06 ENCOUNTER — Ambulatory Visit (INDEPENDENT_AMBULATORY_CARE_PROVIDER_SITE_OTHER): Payer: BLUE CROSS/BLUE SHIELD | Admitting: Internal Medicine

## 2017-01-06 ENCOUNTER — Encounter: Payer: Self-pay | Admitting: Internal Medicine

## 2017-01-06 ENCOUNTER — Other Ambulatory Visit: Payer: Self-pay | Admitting: Internal Medicine

## 2017-01-06 VITALS — BP 118/70 | HR 61 | Temp 98.3°F | Wt 121.2 lb

## 2017-01-06 DIAGNOSIS — I10 Essential (primary) hypertension: Secondary | ICD-10-CM | POA: Diagnosis not present

## 2017-01-06 MED ORDER — LOSARTAN POTASSIUM 100 MG PO TABS: 100.0000 mg | ORAL_TABLET | Freq: Every day | ORAL | 1 refills | Status: DC

## 2017-01-06 NOTE — Patient Instructions (Signed)

## 2017-01-06 NOTE — Progress Notes (Signed)
Subjective:    Patient ID: Diane Gomez, female    DOB: 1955-07-13, 61 y.o.   MRN: [id number]  HPI  Pt presents to the clinic today for 3 week follow up of HTN. At her last visit, her Valsartan was changed to Losartan. She has been taking the medication as prescribed. She has noticed some swelling in her feet. She reports she takes HCTZ 25 mg daily prn for swelling.  She has noticed that her BP has been low at home, around 100/60 but denies dizziness or lightheadedness. Her BP today is 118/70.  Review of Systems  Past Medical History:  Diagnosis Date  . Anxiety   . Asthma    from cats and horses only  . Cancer ([place]) 15 years ago   melanoma   . Fibroid    SUBMUCOUS MYOMA  . GERD (gastroesophageal reflux disease)   . Heart murmur   . Hypertension   . Osteoporosis     Current Outpatient Prescriptions  Medication Sig Dispense Refill  . Calcium Carbonate-Vitamin D (CALCIUM + D PO) Take 1 tablet by mouth daily.     . Cyanocobalamin (B-12 PO) Take 1 tablet by mouth daily.     Marland Kitchen diltiazem (TIAZAC) 300 MG 24 hr capsule Take 300 mg by mouth daily.     Marland Kitchen esomeprazole (NEXIUM) 40 MG capsule Take 20 mg by mouth daily before breakfast.     . losartan (COZAAR) 100 MG tablet Take 1 tablet (100 mg total) by mouth daily. 30 tablet 0  . metoprolol succinate (TOPROL-XL) 50 MG 24 hr tablet Take 2 tablets by mouth daily.      Current Facility-Administered Medications  Medication Dose Route Frequency Provider Last Rate Last Dose  . 0.9 %  sodium chloride infusion  500 mL Intravenous Continuous Nandigam, Venia Minks, MD        Allergies  Allergen Reactions  . Penicillins Hives  . Other     Cats and horses    Family History  Problem Relation Age of Onset  . Hypertension Mother   . Parkinsonism Sister        PARKINSON'S DISEASE  . Breast cancer Other        Niece  Age 57  . Aneurysm Father   . Seizures Sister   . Heart disease Brother   . Colon cancer Neg Hx   . Stomach cancer Neg  Hx   . Esophageal cancer Neg Hx     Social History   Social History  . Marital status: Married    Spouse name: N/A  . Number of children: N/A  . Years of education: N/A   Occupational History  . Not on file.   Social History Main Topics  . Smoking status: Former Smoker    Types: Cigarettes  . Smokeless tobacco: Never Used  . Alcohol use 8.4 oz/week    7 Shots of liquor, 7 Standard drinks or equivalent per week     Comment: moderate..."at least one drink per day" Vodka per pt  . Drug use: Yes    Types: Marijuana     Comment: occasional   . Sexual activity: Not Currently    Birth control/ protection: Post-menopausal, Surgical   Other Topics Concern  . Not on file   Social History Narrative  . No narrative on file     Constitutional: Denies fever, malaise, fatigue, headache or abrupt weight changes.  Respiratory: Denies difficulty breathing, shortness of breath, cough or sputum production.   Cardiovascular:  Pt reports swelling in her feet. Denies chest pain, chest tightness, palpitations or swelling in the hands.  Neurological: Denies dizziness, difficulty with memory, difficulty with speech or problems with balance and coordination.    No other specific complaints in a complete review of systems (except as listed in HPI above).     Objective:   Physical Exam   BP 118/70   Pulse 61   Temp 98.3 F (36.8 C) (Oral)   Wt 121 lb 4 oz (55 kg)   SpO2 98%   BMI 22.54 kg/m  Wt Readings from Last 3 Encounters:  01/06/17 121 lb 4 oz (55 kg)  12/12/16 121 lb 12.8 oz (55.2 kg)  12/11/16 123 lb (55.8 kg)    General: Appears her stated age, well developed, well nourished in NAD. Cardiovascular: Normal rate and rhythm.  No JVD or BLE edema.  Pulmonary/Chest: Normal effort and positive vesicular breath sounds. No respiratory distress. No wheezes, rales or ronchi noted.  Neurological: Alert and oriented.   BMET    Component Value Date/Time   NA 137 07/08/2013 1149     K 3.3 (L) 07/08/2013 1149   CL 105 07/08/2013 1149   CO2 22 07/08/2013 1149   GLUCOSE 95 07/08/2013 1149   BUN 8 07/08/2013 1149   CREATININE 0.66 07/08/2013 1149   CALCIUM 10.0 07/08/2013 1149   GFRNONAA >60 07/08/2013 1149   GFRAA >60 07/08/2013 1149    Lipid Panel     Component Value Date/Time   CHOL 237 (H) 10/21/2012 0844   TRIG 137 10/21/2012 0844   HDL 103 10/21/2012 0844   CHOLHDL 2.3 10/21/2012 0844   VLDL 27 10/21/2012 0844   LDLCALC 107 (H) 10/21/2012 0844    CBC    Component Value Date/Time   WBC 7.2 07/08/2013 1149   WBC 3.7 (L) 10/21/2012 0844   RBC 4.37 07/08/2013 1149   RBC 4.16 10/21/2012 0844   HGB 14.3 07/08/2013 1149   HCT 42.1 07/08/2013 1149   PLT 253 07/08/2013 1149   MCV 96 07/08/2013 1149   MCH 32.8 07/08/2013 1149   MCH 32.2 10/21/2012 0844   MCHC 34.0 07/08/2013 1149   MCHC 36.1 (H) 10/21/2012 0844   RDW 14.3 07/08/2013 1149   LYMPHSABS 1.1 10/21/2012 0844   MONOABS 0.4 10/21/2012 0844   EOSABS 0.1 10/21/2012 0844   BASOSABS 0.0 10/21/2012 0844    Hgb A1C No results found for: HGBA1C        Assessment & Plan:

## 2017-01-06 NOTE — Assessment & Plan Note (Signed)
Controlled on Losartan, Metoprolol and Diltiazem Will montior

## 2017-01-07 ENCOUNTER — Encounter: Payer: Self-pay | Admitting: Internal Medicine

## 2017-02-04 ENCOUNTER — Encounter: Payer: BLUE CROSS/BLUE SHIELD | Admitting: Internal Medicine

## 2017-02-18 ENCOUNTER — Encounter: Payer: Self-pay | Admitting: Internal Medicine

## 2017-02-18 ENCOUNTER — Ambulatory Visit (INDEPENDENT_AMBULATORY_CARE_PROVIDER_SITE_OTHER): Payer: BLUE CROSS/BLUE SHIELD | Admitting: Internal Medicine

## 2017-02-18 VITALS — BP 146/84 | HR 81 | Temp 98.4°F | Ht 61.5 in | Wt 122.0 lb

## 2017-02-18 DIAGNOSIS — I10 Essential (primary) hypertension: Secondary | ICD-10-CM

## 2017-02-18 DIAGNOSIS — Z0000 Encounter for general adult medical examination without abnormal findings: Secondary | ICD-10-CM

## 2017-02-18 DIAGNOSIS — Z0001 Encounter for general adult medical examination with abnormal findings: Secondary | ICD-10-CM | POA: Diagnosis not present

## 2017-02-18 LAB — CBC
HCT: 38.2 % (ref 36.0–46.0)
HEMOGLOBIN: 12.8 g/dL (ref 12.0–15.0)
MCHC: 33.4 g/dL (ref 30.0–36.0)
MCV: 105 fl — ABNORMAL HIGH (ref 78.0–100.0)
PLATELETS: 244 10*3/uL (ref 150.0–400.0)
RBC: 3.64 Mil/uL — ABNORMAL LOW (ref 3.87–5.11)
RDW: 15.3 % (ref 11.5–15.5)
WBC: 4.7 10*3/uL (ref 4.0–10.5)

## 2017-02-18 LAB — COMPREHENSIVE METABOLIC PANEL
ALBUMIN: 4.5 g/dL (ref 3.5–5.2)
ALK PHOS: 66 U/L (ref 39–117)
ALT: 58 U/L — AB (ref 0–35)
AST: 108 U/L — ABNORMAL HIGH (ref 0–37)
BILIRUBIN TOTAL: 0.5 mg/dL (ref 0.2–1.2)
BUN: 6 mg/dL (ref 6–23)
CALCIUM: 9.8 mg/dL (ref 8.4–10.5)
CO2: 30 mEq/L (ref 19–32)
Chloride: 101 mEq/L (ref 96–112)
Creatinine, Ser: 0.72 mg/dL (ref 0.40–1.20)
GFR: 86.89 mL/min (ref >60.00)
Glucose, Bld: 90 mg/dL (ref 70–99)
Potassium: 3.5 mEq/L (ref 3.5–5.1)
Sodium: 143 mEq/L (ref 135–145)
TOTAL PROTEIN: 7.3 g/dL (ref 6.0–8.3)

## 2017-02-18 LAB — LIPID PANEL
Cholesterol: 285 mg/dL — ABNORMAL HIGH (ref 0–200)
HDL: 137 mg/dL (ref >39.00)
LDL Cholesterol: 127 mg/dL — ABNORMAL HIGH (ref 0–99)
NONHDL: 148.28
TRIGLYCERIDES: 104 mg/dL (ref 0.0–149.0)
Total CHOL/HDL Ratio: 2
VLDL: 20.8 mg/dL (ref 0.0–40.0)

## 2017-02-18 LAB — VITAMIN D 25 HYDROXY (VIT D DEFICIENCY, FRACTURES): VITD: 94.34 ng/mL (ref 30.00–100.00)

## 2017-02-18 MED ORDER — LISINOPRIL 10 MG PO TABS: 10.0000 mg | ORAL_TABLET | Freq: Every day | ORAL | 0 refills | Status: DC

## 2017-02-18 NOTE — Progress Notes (Signed)
Subjective:    Patient ID: Diane Gomez, female    DOB: 1956/04/07, 61 y.o.   MRN: [id number]  HPI  Pt presents to the clinic today for her annual exam.  Flu: 02/2016 Tetanus: 04/2014 Zostovax: never Shingrix: never Mammogram: 2010 Pap Smear: 04/2015, [facility] Bone Density: 12/2011 Colon Screening: 11/2016 Vision Screening: as needed Dentist: biannually  Diet: She does eat meat. She consumes fruits and veggies. She avoids fried foods. She drinks mostly water. Exercise: None  Of note, her BP today is 146/84. She reports she completely stopped taking her Losartan because it was causing her blood pressure to be too low. She continues to take her Cardizem and Metoprolol. She has been working with Dr. Saralyn Pilar on her blood pressure but reports she does not follow up with him until November.  Review of Systems  Past Medical History:  Diagnosis Date  . Anxiety   . Asthma    from cats and horses only  . Cancer ([place]) 15 years ago   melanoma   . Fibroid    SUBMUCOUS MYOMA  . GERD (gastroesophageal reflux disease)   . Heart murmur   . Hypertension   . Osteoporosis     Current Outpatient Prescriptions  Medication Sig Dispense Refill  . Calcium Carbonate-Vitamin D (CALCIUM + D PO) Take 1 tablet by mouth daily.     . Cyanocobalamin (B-12 PO) Take 1 tablet by mouth daily.     Marland Kitchen diltiazem (TIAZAC) 300 MG 24 hr capsule Take 300 mg by mouth daily.     Marland Kitchen esomeprazole (NEXIUM) 40 MG capsule Take 20 mg by mouth daily before breakfast.     . hydrochlorothiazide (HYDRODIURIL) 25 MG tablet Take 25 mg by mouth daily as needed.    . metoprolol succinate (TOPROL-XL) 50 MG 24 hr tablet Take 1 tablet by mouth 2 (two) times daily.      Current Facility-Administered Medications  Medication Dose Route Frequency Provider Last Rate Last Dose  . 0.9 %  sodium chloride infusion  500 mL Intravenous Continuous Nandigam, Venia Minks, MD        Allergies  Allergen Reactions  . Penicillins  Hives  . Other     Cats and horses    Family History  Problem Relation Age of Onset  . Hypertension Mother   . Parkinsonism Sister        PARKINSON'S DISEASE  . Breast cancer Other        Niece  Age 64  . Aneurysm Father   . Seizures Sister   . Heart disease Brother   . Colon cancer Neg Hx   . Stomach cancer Neg Hx   . Esophageal cancer Neg Hx     Social History   Social History  . Marital status: Married    Spouse name: N/A  . Number of children: N/A  . Years of education: N/A   Occupational History  . Not on file.   Social History Main Topics  . Smoking status: Former Smoker    Types: Cigarettes  . Smokeless tobacco: Never Used  . Alcohol use 8.4 oz/week    7 Shots of liquor, 7 Standard drinks or equivalent per week     Comment: moderate..."at least one drink per day" Vodka per pt  . Drug use: Yes    Types: Marijuana     Comment: occasional   . Sexual activity: Not Currently    Birth control/ protection: Post-menopausal, Surgical   Other Topics Concern  .  Not on file   Social History Narrative  . No narrative on file     Constitutional: Denies fever, malaise, fatigue, headache or abrupt weight changes.  HEENT: Denies eye pain, eye redness, ear pain, ringing in the ears, wax buildup, runny nose, nasal congestion, bloody nose, or sore throat. Respiratory: Denies difficulty breathing, shortness of breath, cough or sputum production.   Cardiovascular: Pt reports intermittent palpitations. Denies chest pain, chest tightness, or swelling in the hands or feet.  Gastrointestinal: Denies abdominal pain, bloating, constipation, diarrhea or blood in the stool.  GU: Denies urgency, frequency, pain with urination, burning sensation, blood in urine, odor or discharge. Musculoskeletal: Denies decrease in range of motion, difficulty with gait, muscle pain or joint pain and swelling.  Skin: Denies redness, rashes, lesions or ulcercations.  Neurological: Denies dizziness,  difficulty with memory, difficulty with speech or problems with balance and coordination.  Psych: Denies anxiety, depression, SI/HI.  No other specific complaints in a complete review of systems (except as listed in HPI above).     Objective:   Physical Exam  BP (!) 146/84   Pulse 81   Temp 98.4 F (36.9 C) (Oral)   Ht 5' 1.5" (1.562 m)   Wt 122 lb (55.3 kg)   SpO2 98%   BMI 22.68 kg/m  Wt Readings from Last 3 Encounters:  02/18/17 122 lb (55.3 kg)  01/06/17 121 lb 4 oz (55 kg)  12/12/16 121 lb 12.8 oz (55.2 kg)    General: Appears her stated age, well developed, well nourished in NAD. Skin: Warm, dry and intact.  HEENT: Head: normal shape and size; Eyes: sclera white, no icterus, conjunctiva pink, PERRLA and EOMs intact;Throat/Mouth: Teeth present, mucosa pink and moist, no exudate, lesions or ulcerations noted.  Neck:  Neck supple, trachea midline. No masses, lumps or thyromegaly present.  Cardiovascular: Normal rate and rhythm. S1,S2 noted.  No murmur, rubs or gallops noted. No JVD or BLE edema. No carotid bruits noted. Pulmonary/Chest: Normal effort and positive vesicular breath sounds. No respiratory distress. No wheezes, rales or ronchi noted.  Abdomen: Soft and nontender. Normal bowel sounds. No distention or masses noted. Liver, spleen and kidneys non palpable. Musculoskeletal: Strength 5/5 BUE/BLE. No difficulty with gait.  Neurological: Alert and oriented. Cranial nerves II-XII grossly intact. Coordination normal.  Psychiatric: She seems mildly anxious appearing today. Behavior is normal. Judgment and thought content normal.     BMET    Component Value Date/Time   NA 137 07/08/2013 1149   K 3.3 (L) 07/08/2013 1149   CL 105 07/08/2013 1149   CO2 22 07/08/2013 1149   GLUCOSE 95 07/08/2013 1149   BUN 8 07/08/2013 1149   CREATININE 0.66 07/08/2013 1149   CALCIUM 10.0 07/08/2013 1149   GFRNONAA >60 07/08/2013 1149   GFRAA >60 07/08/2013 1149    Lipid Panel      Component Value Date/Time   CHOL 237 (H) 10/21/2012 0844   TRIG 137 10/21/2012 0844   HDL 103 10/21/2012 0844   CHOLHDL 2.3 10/21/2012 0844   VLDL 27 10/21/2012 0844   LDLCALC 107 (H) 10/21/2012 0844    CBC    Component Value Date/Time   WBC 7.2 07/08/2013 1149   WBC 3.7 (L) 10/21/2012 0844   RBC 4.37 07/08/2013 1149   RBC 4.16 10/21/2012 0844   HGB 14.3 07/08/2013 1149   HCT 42.1 07/08/2013 1149   PLT 253 07/08/2013 1149   MCV 96 07/08/2013 1149   MCH 32.8 07/08/2013 1149  MCH 32.2 10/21/2012 0844   MCHC 34.0 07/08/2013 1149   MCHC 36.1 (H) 10/21/2012 0844   RDW 14.3 07/08/2013 1149   LYMPHSABS 1.1 10/21/2012 0844   MONOABS 0.4 10/21/2012 0844   EOSABS 0.1 10/21/2012 0844   BASOSABS 0.0 10/21/2012 0844    Hgb A1C No results found for: HGBA1C          Assessment & Plan:   Preventative Health Maintenance:  Encouraged her to get her flu shot in the fall, she didn't want to get it today Tetanus UTD She will call her insurance company about Shingrix She does not want me to order her bone density and mammogram, she is seeing her GYN later this year and wants them to order it Pap smear UTD Colon Screening UTD Encouraged her to consume a balanced diet and exercise regimen Advised her to see an eye doctor and dentist annually Will check CBC, CMET, Lipid and Vit D today  RTC in 2 weeks for BP check Deondrick Searls, NP

## 2017-02-18 NOTE — Assessment & Plan Note (Signed)
Uncontrolled D/C Losartan Start Lisinopril 10 mg daily Continue Cardizem and Metoprolol She takes HCTZ on a very rare as needed basis for swelling, reports it causes her potassium to be too low

## 2017-02-18 NOTE — Patient Instructions (Signed)
Health Maintenance for Postmenopausal Women Menopause is a normal process in which your reproductive ability comes to an end. This process happens gradually over a span of months to years, usually between the ages of 22 and 9. Menopause is complete when you have missed 12 consecutive menstrual periods. It is important to talk with your health care provider about some of the most common conditions that affect postmenopausal women, such as heart disease, cancer, and bone loss (osteoporosis). Adopting a healthy lifestyle and getting preventive care can help to promote your health and wellness. Those actions can also lower your chances of developing some of these common conditions. What should I know about menopause? During menopause, you may experience a number of symptoms, such as:  Moderate-to-severe hot flashes.  Night sweats.  Decrease in sex drive.  Mood swings.  Headaches.  Tiredness.  Irritability.  Memory problems.  Insomnia.  Choosing to treat or not to treat menopausal changes is an individual decision that you make with your health care provider. What should I know about hormone replacement therapy and supplements? Hormone therapy products are effective for treating symptoms that are associated with menopause, such as hot flashes and night sweats. Hormone replacement carries certain risks, especially as you become older. If you are thinking about using estrogen or estrogen with progestin treatments, discuss the benefits and risks with your health care provider. What should I know about heart disease and stroke? Heart disease, heart attack, and stroke become more likely as you age. This may be due, in part, to the hormonal changes that your body experiences during menopause. These can affect how your body processes dietary fats, triglycerides, and cholesterol. Heart attack and stroke are both medical emergencies. There are many things that you can do to help prevent heart disease  and stroke:  Have your blood pressure checked at least every 1-2 years. High blood pressure causes heart disease and increases the risk of stroke.  If you are 53-22 years old, ask your health care provider if you should take aspirin to prevent a heart attack or a stroke.  Do not use any tobacco products, including cigarettes, chewing tobacco, or electronic cigarettes. If you need help quitting, ask your health care provider.  It is important to eat a healthy diet and maintain a healthy weight. ? Be sure to include plenty of vegetables, fruits, low-fat dairy products, and lean protein. ? Avoid eating foods that are high in solid fats, added sugars, or salt (sodium).  Get regular exercise. This is one of the most important things that you can do for your health. ? Try to exercise for at least 150 minutes each week. The type of exercise that you do should increase your heart rate and make you sweat. This is known as moderate-intensity exercise. ? Try to do strengthening exercises at least twice each week. Do these in addition to the moderate-intensity exercise.  Know your numbers.Ask your health care provider to check your cholesterol and your blood glucose. Continue to have your blood tested as directed by your health care provider.  What should I know about cancer screening? There are several types of cancer. Take the following steps to reduce your risk and to catch any cancer development as early as possible. Breast Cancer  Practice breast self-awareness. ? This means understanding how your breasts normally appear and feel. ? It also means doing regular breast self-exams. Let your health care provider know about any changes, no matter how small.  If you are 40  or older, have a clinician do a breast exam (clinical breast exam or CBE) every year. Depending on your age, family history, and medical history, it may be recommended that you also have a yearly breast X-ray (mammogram).  If you  have a family history of breast cancer, talk with your health care provider about genetic screening.  If you are at high risk for breast cancer, talk with your health care provider about having an MRI and a mammogram every year.  Breast cancer (BRCA) gene test is recommended for women who have family members with BRCA-related cancers. Results of the assessment will determine the need for genetic counseling and BRCA1 and for BRCA2 testing. BRCA-related cancers include these types: ? Breast. This occurs in males or females. ? Ovarian. ? Tubal. This may also be called fallopian tube cancer. ? Cancer of the abdominal or pelvic lining (peritoneal cancer). ? Prostate. ? Pancreatic.  Cervical, Uterine, and Ovarian Cancer Your health care provider may recommend that you be screened regularly for cancer of the pelvic organs. These include your ovaries, uterus, and vagina. This screening involves a pelvic exam, which includes checking for microscopic changes to the surface of your cervix (Pap test).  For women ages 21-65, health care providers may recommend a pelvic exam and a Pap test every three years. For women ages 79-65, they may recommend the Pap test and pelvic exam, combined with testing for human papilloma virus (HPV), every five years. Some types of HPV increase your risk of cervical cancer. Testing for HPV may also be done on women of any age who have unclear Pap test results.  Other health care providers may not recommend any screening for nonpregnant women who are considered low risk for pelvic cancer and have no symptoms. Ask your health care provider if a screening pelvic exam is right for you.  If you have had past treatment for cervical cancer or a condition that could lead to cancer, you need Pap tests and screening for cancer for at least 20 years after your treatment. If Pap tests have been discontinued for you, your risk factors (such as having a new sexual partner) need to be  reassessed to determine if you should start having screenings again. Some women have medical problems that increase the chance of getting cervical cancer. In these cases, your health care provider may recommend that you have screening and Pap tests more often.  If you have a family history of uterine cancer or ovarian cancer, talk with your health care provider about genetic screening.  If you have vaginal bleeding after reaching menopause, tell your health care provider.  There are currently no reliable tests available to screen for ovarian cancer.  Lung Cancer Lung cancer screening is recommended for adults 69-62 years old who are at high risk for lung cancer because of a history of smoking. A yearly low-dose CT scan of the lungs is recommended if you:  Currently smoke.  Have a history of at least 30 pack-years of smoking and you currently smoke or have quit within the past 15 years. A pack-year is smoking an average of one pack of cigarettes per day for one year.  Yearly screening should:  Continue until it has been 15 years since you quit.  Stop if you develop a health problem that would prevent you from having lung cancer treatment.  Colorectal Cancer  This type of cancer can be detected and can often be prevented.  Routine colorectal cancer screening usually begins at  age 42 and continues through age 45.  If you have risk factors for colon cancer, your health care provider may recommend that you be screened at an earlier age.  If you have a family history of colorectal cancer, talk with your health care provider about genetic screening.  Your health care provider may also recommend using home test kits to check for hidden blood in your stool.  A small camera at the end of a tube can be used to examine your colon directly (sigmoidoscopy or colonoscopy). This is done to check for the earliest forms of colorectal cancer.  Direct examination of the colon should be repeated every  5-10 years until age 71. However, if early forms of precancerous polyps or small growths are found or if you have a family history or genetic risk for colorectal cancer, you may need to be screened more often.  Skin Cancer  Check your skin from head to toe regularly.  Monitor any moles. Be sure to tell your health care provider: ? About any new moles or changes in moles, especially if there is a change in a mole's shape or color. ? If you have a mole that is larger than the size of a pencil eraser.  If any of your family members has a history of skin cancer, especially at a young age, talk with your health care provider about genetic screening.  Always use sunscreen. Apply sunscreen liberally and repeatedly throughout the day.  Whenever you are outside, protect yourself by wearing long sleeves, pants, a wide-brimmed hat, and sunglasses.  What should I know about osteoporosis? Osteoporosis is a condition in which bone destruction happens more quickly than new bone creation. After menopause, you may be at an increased risk for osteoporosis. To help prevent osteoporosis or the bone fractures that can happen because of osteoporosis, the following is recommended:  If you are 46-71 years old, get at least 1,000 mg of calcium and at least 600 mg of vitamin D per day.  If you are older than age 55 but younger than age 65, get at least 1,200 mg of calcium and at least 600 mg of vitamin D per day.  If you are older than age 54, get at least 1,200 mg of calcium and at least 800 mg of vitamin D per day.  Smoking and excessive alcohol intake increase the risk of osteoporosis. Eat foods that are rich in calcium and vitamin D, and do weight-bearing exercises several times each week as directed by your health care provider. What should I know about how menopause affects my mental health? Depression may occur at any age, but it is more common as you become older. Common symptoms of depression  include:  Low or sad mood.  Changes in sleep patterns.  Changes in appetite or eating patterns.  Feeling an overall lack of motivation or enjoyment of activities that you previously enjoyed.  Frequent crying spells.  Talk with your health care provider if you think that you are experiencing depression. What should I know about immunizations? It is important that you get and maintain your immunizations. These include:  Tetanus, diphtheria, and pertussis (Tdap) booster vaccine.  Influenza every year before the flu season begins.  Pneumonia vaccine.  Shingles vaccine.  Your health care provider may also recommend other immunizations. This information is not intended to replace advice given to you by your health care provider. Make sure you discuss any questions you have with your health care provider. Document Released: 07/05/2005  Document Revised: 12/01/2015 Document Reviewed: 02/14/2015 Elsevier Interactive Patient Education  2018 [employer]  

## 2017-02-21 ENCOUNTER — Encounter: Payer: Self-pay | Admitting: Internal Medicine

## 2017-03-04 ENCOUNTER — Other Ambulatory Visit: Payer: Self-pay | Admitting: Internal Medicine

## 2017-03-18 ENCOUNTER — Other Ambulatory Visit: Payer: Self-pay | Admitting: Internal Medicine

## 2017-03-24 ENCOUNTER — Ambulatory Visit: Payer: Self-pay | Admitting: Internal Medicine

## 2017-03-27 ENCOUNTER — Encounter: Payer: Self-pay | Admitting: Internal Medicine

## 2017-03-27 ENCOUNTER — Ambulatory Visit (INDEPENDENT_AMBULATORY_CARE_PROVIDER_SITE_OTHER): Payer: BLUE CROSS/BLUE SHIELD | Admitting: Internal Medicine

## 2017-03-27 VITALS — BP 132/82 | HR 68 | Temp 98.3°F | Wt 120.0 lb

## 2017-03-27 DIAGNOSIS — F1010 Alcohol abuse, uncomplicated: Secondary | ICD-10-CM | POA: Diagnosis not present

## 2017-03-27 DIAGNOSIS — Z23 Encounter for immunization: Secondary | ICD-10-CM | POA: Diagnosis not present

## 2017-03-27 DIAGNOSIS — R748 Abnormal levels of other serum enzymes: Secondary | ICD-10-CM | POA: Diagnosis not present

## 2017-03-27 DIAGNOSIS — I10 Essential (primary) hypertension: Secondary | ICD-10-CM

## 2017-03-27 LAB — COMPREHENSIVE METABOLIC PANEL
ALBUMIN: 4.6 g/dL (ref 3.5–5.2)
ALK PHOS: 56 U/L (ref 39–117)
ALT: 25 U/L (ref 0–35)
AST: 30 U/L (ref 0–37)
BUN: 9 mg/dL (ref 6–23)
CO2: 30 mEq/L (ref 19–32)
CREATININE: 0.73 mg/dL (ref 0.40–1.20)
Calcium: 10.5 mg/dL (ref 8.4–10.5)
Chloride: 98 mEq/L (ref 96–112)
GFR: 85.49 mL/min (ref >60.00)
GLUCOSE: 90 mg/dL (ref 70–99)
Potassium: 3.6 mEq/L (ref 3.5–5.1)
SODIUM: 141 meq/L (ref 135–145)
TOTAL PROTEIN: 7.8 g/dL (ref 6.0–8.3)
Total Bilirubin: 0.5 mg/dL (ref 0.2–1.2)

## 2017-03-27 NOTE — Assessment & Plan Note (Signed)
Controlled on Lisinopril, Metoprolol, and Cardizem She does not need refills at this time

## 2017-03-27 NOTE — Addendum Note (Signed)
Addended by: Lurlean Nanny on: 03/27/2017 05:21 PM   Modules accepted: Orders

## 2017-03-27 NOTE — Patient Instructions (Signed)

## 2017-03-27 NOTE — Progress Notes (Signed)
Subjective:    Patient ID: Diane Gomez, female    DOB: 1956-01-11, 61 y.o.   MRN: [id number]  HPI  Pt presents to the clinic today for 1 month follow up of HTN. At her last visit, her Losartan was d/c'd. She was started on Lisinopril in addition to her Cardizem and Metoprolol. She has been taking the medication as prescribed. Her BP today is 132/82. ECG from 06/2013 reviewed.  She also wants to repeat her liver enzymes. These have been elevated on her last 2 lab exams. She does drink some daily vodka, 2-3 shots per day. She cut out beer, brown liquor or wine. She does not take anything that contains tylenol. She has no history of IV drug use. She has not had sexual intercourse with anyone she knows that has Hep C.  Review of Systems      Past Medical History:  Diagnosis Date  . Anxiety   . Asthma    from cats and horses only  . Cancer ([place]) 15 years ago   melanoma   . Fibroid    SUBMUCOUS MYOMA  . GERD (gastroesophageal reflux disease)   . Heart murmur   . Hypertension   . Osteoporosis     Current Outpatient Prescriptions  Medication Sig Dispense Refill  . Calcium Carbonate-Vitamin D (CALCIUM + D PO) Take 1 tablet by mouth daily.     . Cyanocobalamin (B-12 PO) Take 1 tablet by mouth daily.     Marland Kitchen diltiazem (TIAZAC) 300 MG 24 hr capsule Take 300 mg by mouth daily.     Marland Kitchen esomeprazole (NEXIUM) 40 MG capsule Take 20 mg by mouth daily before breakfast.     . hydrochlorothiazide (HYDRODIURIL) 25 MG tablet Take 25 mg by mouth daily as needed.    Marland Kitchen lisinopril (PRINIVIL,ZESTRIL) 10 MG tablet TAKE 1 TABLET (10 MG TOTAL) BY MOUTH DAILY. 30 tablet 0  . metoprolol succinate (TOPROL-XL) 50 MG 24 hr tablet Take 1 tablet by mouth 2 (two) times daily.      Current Facility-Administered Medications  Medication Dose Route Frequency Provider Last Rate Last Dose  . 0.9 %  sodium chloride infusion  500 mL Intravenous Continuous Nandigam, Venia Minks, MD        Allergies  Allergen Reactions   . Penicillins Hives  . Other     Cats and horses    Family History  Problem Relation Age of Onset  . Hypertension Mother   . Parkinsonism Sister        PARKINSON'S DISEASE  . Breast cancer Other        Niece  Age 42  . Aneurysm Father   . Seizures Sister   . Heart disease Brother   . Colon cancer Neg Hx   . Stomach cancer Neg Hx   . Esophageal cancer Neg Hx     Social History   Social History  . Marital status: Married    Spouse name: N/A  . Number of children: N/A  . Years of education: N/A   Occupational History  . Not on file.   Social History Main Topics  . Smoking status: Former Smoker    Types: Cigarettes  . Smokeless tobacco: Never Used  . Alcohol use 8.4 oz/week    7 Shots of liquor, 7 Standard drinks or equivalent per week     Comment: moderate..."at least one drink per day" Vodka per pt  . Drug use: Yes    Types: Marijuana  Comment: occasional   . Sexual activity: Not Currently    Birth control/ protection: Post-menopausal, Surgical   Other Topics Concern  . Not on file   Social History Narrative  . No narrative on file     Constitutional: Denies fever, malaise, fatigue, headache or abrupt weight changes.  Respiratory: Denies difficulty breathing, shortness of breath, cough or sputum production.   Cardiovascular: Denies chest pain, chest tightness, palpitations or swelling in the hands or feet.  Gastrointestinal: Pt reports mild abdominal pain and loose stools. Denies bloating, constipation, or blood in the stool.  Neurological: Denies dizziness, difficulty with memory, difficulty with speech or problems with balance and coordination.    No other specific complaints in a complete review of systems (except as listed in HPI above).  Objective:   Physical Exam  BP 132/82   Pulse 68   Temp 98.3 F (36.8 C) (Oral)   Wt 120 lb (54.4 kg)   SpO2 98%   BMI 22.31 kg/m  Wt Readings from Last 3 Encounters:  03/27/17 120 lb (54.4 kg)    02/18/17 122 lb (55.3 kg)  01/06/17 121 lb 4 oz (55 kg)    General: Appears her stated age, well developed, well nourished in NAD. Cardiovascular: Normal rate and rhythm. S1,S2 noted.  No murmur, rubs or gallops noted. No JVD or BLE edema.  Pulmonary/Chest: Normal effort and positive vesicular breath sounds. No respiratory distress. No wheezes, rales or ronchi noted.  Abdomen: Soft and nontender. Normal bowel sounds. No distention or masses noted. Liver, spleen and kidneys non palpable. Neurological: Alert and oriented.    BMET    Component Value Date/Time   NA 143 02/18/2017 0908   NA 137 07/08/2013 1149   K 3.5 02/18/2017 0908   K 3.3 (L) 07/08/2013 1149   CL 101 02/18/2017 0908   CL 105 07/08/2013 1149   CO2 30 02/18/2017 0908   CO2 22 07/08/2013 1149   GLUCOSE 90 02/18/2017 0908   GLUCOSE 95 07/08/2013 1149   BUN 6 02/18/2017 0908   BUN 8 07/08/2013 1149   CREATININE 0.72 02/18/2017 0908   CREATININE 0.66 07/08/2013 1149   CALCIUM 9.8 02/18/2017 0908   CALCIUM 10.0 07/08/2013 1149   GFRNONAA >60 07/08/2013 1149   GFRAA >60 07/08/2013 1149    Lipid Panel     Component Value Date/Time   CHOL 285 (H) 02/18/2017 0908   TRIG 104.0 02/18/2017 0908   HDL 137.00 02/18/2017 0908   CHOLHDL 2 02/18/2017 0908   VLDL 20.8 02/18/2017 0908   LDLCALC 127 (H) 02/18/2017 0908    CBC    Component Value Date/Time   WBC 4.7 02/18/2017 0908   RBC 3.64 (L) 02/18/2017 0908   HGB 12.8 02/18/2017 0908   HGB 14.3 07/08/2013 1149   HCT 38.2 02/18/2017 0908   HCT 42.1 07/08/2013 1149   PLT 244.0 02/18/2017 0908   PLT 253 07/08/2013 1149   MCV 105.0 (H) 02/18/2017 0908   MCV 96 07/08/2013 1149   MCH 32.8 07/08/2013 1149   MCH 32.2 10/21/2012 0844   MCHC 33.4 02/18/2017 0908   RDW 15.3 02/18/2017 0908   RDW 14.3 07/08/2013 1149   LYMPHSABS 1.1 10/21/2012 0844   MONOABS 0.4 10/21/2012 0844   EOSABS 0.1 10/21/2012 0844   BASOSABS 0.0 10/21/2012 0844    Hgb A1C No results  found for: HGBA1C          Assessment & Plan:   Elevated Liver Enzymes, Alcohol Abuse:  Advised  her to cut out the vodka Repeat CMET today Will check acute hep panel today  Will follow up after labs, return precautions discussed Webb Silversmith, NP

## 2017-03-28 LAB — REFLEX TIQ

## 2017-03-28 LAB — ACUTE HEP PANEL AND HEP B SURFACE AB
HEP B C IGM: NONREACTIVE
HEPATITIS C ANTIBODY REFILL: NONREACTIVE
Hep A IgM: NONREACTIVE
Hepatitis B Surface Ag: NONREACTIVE
SIGNAL TO CUT-OFF: 0.01 (ref <1.00)

## 2017-04-01 ENCOUNTER — Ambulatory Visit: Payer: BLUE CROSS/BLUE SHIELD | Admitting: Physician Assistant

## 2017-04-01 ENCOUNTER — Encounter: Payer: Self-pay | Admitting: Physician Assistant

## 2017-04-01 VITALS — BP 120/76 | HR 68 | Ht 61.0 in | Wt 118.8 lb

## 2017-04-01 DIAGNOSIS — R194 Change in bowel habit: Secondary | ICD-10-CM | POA: Diagnosis not present

## 2017-04-01 DIAGNOSIS — R197 Diarrhea, unspecified: Secondary | ICD-10-CM | POA: Diagnosis not present

## 2017-04-01 DIAGNOSIS — R634 Abnormal weight loss: Secondary | ICD-10-CM | POA: Diagnosis not present

## 2017-04-01 DIAGNOSIS — R945 Abnormal results of liver function studies: Secondary | ICD-10-CM

## 2017-04-01 DIAGNOSIS — R109 Unspecified abdominal pain: Secondary | ICD-10-CM

## 2017-04-01 DIAGNOSIS — R6881 Early satiety: Secondary | ICD-10-CM | POA: Diagnosis not present

## 2017-04-01 DIAGNOSIS — R7989 Other specified abnormal findings of blood chemistry: Secondary | ICD-10-CM

## 2017-04-01 NOTE — Patient Instructions (Signed)
You have been scheduled for an endoscopy. Please follow written instructions given to you at your visit today. If you use inhalers (even only as needed), please bring them with you on the day of your procedure. Your physician has requested that you go to www.[email or web address] and enter the access code given to you at your visit today. This web site gives a general overview about your procedure. However, you should still follow specific instructions given to you by our office regarding your preparation for the procedure.  Please purchase the following medications over the counter and take as directed: Align probiotic daily for 2 months Metamucil daily  We have given you a high fiber diet handout. Please strive to have 25-30 grams of fiber daily.

## 2017-04-01 NOTE — Progress Notes (Signed)
Chief Complaint: Change in bowel habits, abdominal pain, weight loss and early satiety  HPI:    Diane Gomez is a 61 year old Caucasian female with a past medical history as listed below including anxiety and reflux, who presents to clinic today with a complaint of change in bowel habits, abdominal pain, weight loss and early satiety.    Patient was recently seen in our clinic 12/11/16 for a screening colonoscopy= with Dr. Silverio Decamp.  There was finding of one 5 mm polyp in the transverse colon, one 10 mm polyp in the sigmoid colon and diverticulosis in the sigmoid and descending colon.  Pathology revealed tubular adenoma and hyperplastic polyp.  Repeat was recommended in 5 years.    Per review of chart patient had labs 5 days ago which were normal including a CMP and acute hepatitis panel.  Of note patient's liver enzymes were elevated a month ago with an AST of 108 and ALT of 58.    Today, the patient presents to clinic and it is clear she is anxious.  The patient describes multiple medical problems.  Initially the patient describes that in July she had a colonoscopy.  Since then the patient has had 2 episodes of a "explosive diarrhea", which left her no time to get to the toilet.  She tells me that these were after eating foods that she normally does not eat, as she tries to maintain a quite healthy diet and has done for the past year.  Patient notes that she has also noticed that sometimes she will awaken in the middle of the night to have a bowel movement, typically it is solid at first and then she will have a couple more afterwards which decrease in consistency.  This does not happen every night but it is abnormal for her.  Patient also describes seeing some mucus with stools occasionally.  Patient did start Activia yogurt and takes this every other day.  She tells me this tends to "stop me up".  Patient is concerned in regards to diverticula which were noted on her recent colonoscopy and whether or  not this could be playing a role.    Patient also describes early satiety today noting that she can eat 5-6 bites of food and feel full and have abdominal pain.  This has been occurring over the past 6 months or more.  Patient tells me that she wanted to have an endoscopy with her colonoscopy but was told that she could not unless she saw a doctor first and did not want to delay colonoscopy.  Patient is on Nexium 20 mg once daily which does help with chronic reflux symptoms.    Today, patient also describes a weight loss of around 10 pounds over the past year.  She tells me she has not changed her diet dramatically and is unsure why this is occurring.    Patient also describes a recent elevation in her liver enzymes above.  She was told these were normal now and her recent hepatitis testing was negative but she believes this could be wrong.  Patient tells me she has read symptoms about hepatitis online and feels as though she may have this.  Though the patient is experiencing no abdominal pain, jaundice or other today. 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    Patient also describes increase in urination and swelling of her feet over the past 6 months which is new for her.  Apparently, she went to have a "healthy life test" done in [place] due to her multiple medical problems.  She brings a report with her today and would like one in her chart.   Patient's social history is positive for increased anxiety and awareness of her health.  Apparently patient's best friend died of pancreatic cancer within 27 days from diagnosis back in July when the patient started with all of her symptoms.    Patient denies fever, chills, blood in her stool, melena, anorexia, nausea or vomiting.  Past Medical History:  Diagnosis Date  . Anxiety   . Asthma    from cats and horses only  . Cancer ([place]) 15 years ago   melanoma   . Fibroid    SUBMUCOUS  MYOMA  . GERD (gastroesophageal reflux disease)   . Heart murmur   . Hypertension   . Osteoporosis     Past Surgical History:  Procedure Laterality Date  . CESAREAN SECTION    . ENDOMETRIAL ABLATION  2009   HER OPTION  . HYSTEROSCOPY  2008   MYOMECTOMY  . TUBAL LIGATION      Current Outpatient Medications  Medication Sig Dispense Refill  . Calcium Carbonate-Vitamin D (CALCIUM + D PO) Take 2 tablets daily by mouth.     . diltiazem (TIAZAC) 300 MG 24 hr capsule Take 300 mg by mouth daily.     Marland Kitchen esomeprazole (NEXIUM) 40 MG capsule Take 20 mg by mouth daily before breakfast.     . hydrochlorothiazide (HYDRODIURIL) 25 MG tablet Take 25 mg by mouth daily as needed.    Marland Kitchen lisinopril (PRINIVIL,ZESTRIL) 10 MG tablet TAKE 1 TABLET (10 MG TOTAL) BY MOUTH DAILY. 30 tablet 0  . metoprolol succinate (TOPROL-XL) 50 MG 24 hr tablet Take 1 tablet by mouth daily.      Current Facility-Administered Medications  Medication Dose Route Frequency Provider Last Rate Last Dose  . 0.9 %  sodium chloride infusion  500 mL Intravenous Continuous Mauri Pole, MD        Allergies as of 04/01/2017 - Review Complete 04/01/2017  Allergen Reaction Noted  . Penicillins Hives 10/04/2010  . Other  12/11/2016    Family History  Problem Relation Age of Onset  . Hypertension Mother   . Parkinsonism Sister        PARKINSON'S DISEASE  . Breast cancer Other        Niece  Age 69  . Aneurysm Father   . Seizures Sister   . Heart disease Brother   . Colon cancer Neg Hx   . Stomach cancer Neg Hx   . Esophageal cancer Neg Hx     Social History   Socioeconomic History  . Marital status: Married    Spouse name: Not on file  . Number of children: Not on file  . Years of education: Not on file  . Highest education level: Not on file  Social Needs  . Financial resource strain: Not on file  . Food insecurity - worry: Not on file  . Food insecurity - inability: Not on file  . Transportation needs -  medical: Not on file  . Transportation needs - non-medical: Not on file  Occupational History  . Not on file  Tobacco Use  . Smoking status: Former Smoker    Types: Cigarettes  . Smokeless tobacco: Never Used  Substance and Sexual Activity  . Alcohol use: Yes    Alcohol/week: 8.4 oz    Types: 7 Shots of  liquor, 7 Standard drinks or equivalent per week    Comment: moderate..."at least one drink per day" Vodka per pt  . Drug use: Yes    Types: Marijuana    Comment: occasional   . Sexual activity: Not Currently    Birth control/protection: Post-menopausal, Surgical  Other Topics Concern  . Not on file  Social History Narrative  . Not on file    Review of Systems:    Constitutional: No fever or chills Skin: No rash  Cardiovascular: No chest pain Respiratory: No SOB  Gastrointestinal: See HPI and otherwise negative Genitourinary: Decreased frequency of urination Neurological: No headache Musculoskeletal: No new muscle or joint pain Hematologic: No bleeding  Psychiatric: Positive for anxiety   Physical Exam:  Vital signs: BP 120/76   Pulse 68   Ht 5\' 1"  (1.549 m)   Wt 118 lb 12.8 oz (53.9 kg)   BMI 22.45 kg/m   Constitutional: Caucasian female appears to be in NAD, Well developed, Well nourished, alert and cooperative Head:  Normocephalic and atraumatic. Eyes:   PEERL, EOMI. No icterus. Conjunctiva pink. Ears:  Normal auditory acuity. Neck:  Supple Throat: Oral cavity and pharynx without inflammation, swelling or lesion.  Respiratory: Respirations even and unlabored. Lungs clear to auscultation bilaterally.   No wheezes, crackles, or rhonchi.  Cardiovascular: Normal S1, S2. No MRG. Regular rate and rhythm. No peripheral edema, cyanosis or pallor.  Gastrointestinal:  Soft, nondistended, no abdominal tenderness. No rebound or guarding. Normal bowel sounds. No appreciable masses or hepatomegaly. Rectal:  Not performed.  Msk:  Symmetrical without gross deformities.  Without edema, no deformity or joint abnormality.  Neurologic:  Alert and  oriented x4;  grossly normal neurologically.  Skin:   Dry and intact without significant lesions or rashes. Psychiatric:  Demonstrates good judgement and reason without abnormal affect or behaviors.  MOST RECENT LABS AND IMAGING: CBC    Component Value Date/Time   WBC 4.7 02/18/2017 0908   RBC 3.64 (L) 02/18/2017 0908   HGB 12.8 02/18/2017 0908   HGB 14.3 07/08/2013 1149   HCT 38.2 02/18/2017 0908   HCT 42.1 07/08/2013 1149   PLT 244.0 02/18/2017 0908   PLT 253 07/08/2013 1149   MCV 105.0 (H) 02/18/2017 0908   MCV 96 07/08/2013 1149   MCH 32.8 07/08/2013 1149   MCH 32.2 10/21/2012 0844   MCHC 33.4 02/18/2017 0908   RDW 15.3 02/18/2017 0908   RDW 14.3 07/08/2013 1149   LYMPHSABS 1.1 10/21/2012 0844   MONOABS 0.4 10/21/2012 0844   EOSABS 0.1 10/21/2012 0844   BASOSABS 0.0 10/21/2012 0844    CMP     Component Value Date/Time   NA 141 03/27/2017 1130   NA 137 07/08/2013 1149   K 3.6 03/27/2017 1130   K 3.3 (L) 07/08/2013 1149   CL 98 03/27/2017 1130   CL 105 07/08/2013 1149   CO2 30 03/27/2017 1130   CO2 22 07/08/2013 1149   GLUCOSE 90 03/27/2017 1130   GLUCOSE 95 07/08/2013 1149   BUN 9 03/27/2017 1130   BUN 8 07/08/2013 1149   CREATININE 0.73 03/27/2017 1130   CREATININE 0.66 07/08/2013 1149   CALCIUM 10.5 03/27/2017 1130   CALCIUM 10.0 07/08/2013 1149   PROT 7.8 03/27/2017 1130   PROT 8.3 (H) 07/08/2013 1149   ALBUMIN 4.6 03/27/2017 1130   ALBUMIN 4.2 07/08/2013 1149   AST 30 03/27/2017 1130   AST 41 (H) 07/08/2013 1149   ALT 25 03/27/2017 1130   ALT  34 07/08/2013 1149   ALKPHOS 56 03/27/2017 1130   ALKPHOS 69 07/08/2013 1149   BILITOT 0.5 03/27/2017 1130   BILITOT 0.4 07/08/2013 1149   GFRNONAA >60 07/08/2013 1149   GFRAA >60 07/08/2013 1149    Assessment: 1.  Change in bowel habits: some urgent diarrhea since colonoscopy, also increase in stress; most likely IBS  2.  Early  satiety: feels full after 5-6 bits of food, likely related to functional dyspepsia 3.  Loss of weight: 10-15 pounds in the past year, consider relation to decreased intake due to anxiety and satiety above 4.  Elevated LFTs: now normal, dicussed at length-consider relation to etoh/meds vs other  Plan: 1.  Discussed with the patient that most of her symptoms could be related to irritable bowel syndrome/functional dyspepsia.  We discussed that she has increased anxiety since the death of her friend.  Her daily alcohol consumption is also contributing. 2.  Recommend patient decrease frequency of alcohol intake/stop altogether. 3.  Recommend patient start a daily probiotic such as Align.  Did provide her with a coupon. 4.  Recommend patient increase fiber in her diet to at least 25-35 g/day with the use of fiber supplement such as Metamucil once daily. 4.  Scheduled patient for an EGD in the [place] with Dr. Silverio Decamp.  Did discuss risk, benefits, limitations and alternatives.  This is due to her early satiety and the fact that the patient is very anxious regarding results. 5.  We did discuss patient's elevated liver enzymes in detail.  These are now back to normal.  Recommend that she continue to follow with her primary care provider regarding routine checks. 6.  Patient to follow in clinic per recommendations from Dr. Silverio Decamp after time of procedure.  Diane Newer, PA-C [facility] 04/01/2017, 11:11 AM  Cc: Jearld Fenton, NP

## 2017-04-03 ENCOUNTER — Other Ambulatory Visit: Payer: BLUE CROSS/BLUE SHIELD

## 2017-04-03 ENCOUNTER — Other Ambulatory Visit: Payer: Self-pay

## 2017-04-03 ENCOUNTER — Ambulatory Visit (AMBULATORY_SURGERY_CENTER): Payer: BLUE CROSS/BLUE SHIELD | Admitting: Gastroenterology

## 2017-04-03 ENCOUNTER — Encounter: Payer: Self-pay | Admitting: Gastroenterology

## 2017-04-03 VITALS — BP 158/87 | HR 73 | Temp 98.4°F | Resp 21 | Ht 61.0 in | Wt 118.0 lb

## 2017-04-03 DIAGNOSIS — R197 Diarrhea, unspecified: Secondary | ICD-10-CM

## 2017-04-03 DIAGNOSIS — R6881 Early satiety: Secondary | ICD-10-CM | POA: Diagnosis not present

## 2017-04-03 DIAGNOSIS — R1030 Lower abdominal pain, unspecified: Secondary | ICD-10-CM

## 2017-04-03 MED ORDER — SODIUM CHLORIDE 0.9 % IV SOLN: 500.0000 mL | INTRAVENOUS | Status: DC

## 2017-04-03 NOTE — Op Note (Signed)
[facility] Patient Name: Diane Gomez Procedure Date: 04/03/2017 3:21 PM MRN: [id number] Endoscopist: Mauri Pole , MD Age: 61 Referring MD:  Date of Birth: 1956/03/25 Gender: Female Account #: [id number] Procedure:                Upper GI endoscopy Indications:              Dyspepsia, Early satiety, Weight loss, Diarrhea Medicines:                Monitored Anesthesia Care Procedure:                Pre-Anesthesia Assessment:                           - Prior to the procedure, a History and Physical                            was performed, and patient medications and                            allergies were reviewed. The patient's tolerance of                            previous anesthesia was also reviewed. The risks                            and benefits of the procedure and the sedation                            options and risks were discussed with the patient.                            All questions were answered, and informed consent                            was obtained. Prior Anticoagulants: The patient has                            taken no previous anticoagulant or antiplatelet                            agents. ASA Grade Assessment: II - A patient with                            mild systemic disease. After reviewing the risks                            and benefits, the patient was deemed in                            satisfactory condition to undergo the procedure.                           After obtaining informed consent, the endoscope was  passed under direct vision. Throughout the                            procedure, the patient's blood pressure, pulse, and                            oxygen saturations were monitored continuously. The                            Endoscope was introduced through the mouth, and                            advanced to the second part of duodenum. The upper   GI endoscopy was accomplished without difficulty.                            The patient tolerated the procedure well. Scope In: Scope Out: Findings:                 The esophagus was normal. Regular Z-line at 35cm                           The stomach was normal.                           The duodenal bulb and second portion of the                            duodenum were normal. Biopsies for histology were                            taken with a cold forceps for evaluation of celiac                            disease. Complications:            No immediate complications. Estimated Blood Loss:     Estimated blood loss was minimal. Impression:               - Normal esophagus.                           - Normal stomach.                           - Normal duodenal bulb and second portion of the                            duodenum. Biopsied. Recommendation:           - Patient has a contact number available for                            emergencies. The signs and symptoms of potential                            delayed complications were discussed with the  patient. Return to normal activities tomorrow.                            Written discharge instructions were provided to the                            patient.                           - Resume previous diet.                           - Continue present medications.                           - Await pathology results.                           - Check fecal elastase                           - Avoid lactose, high fructose corn syrup, soda,                            and artificial sweeteners Mauri Pole, MD 04/03/2017 3:38:01 PM This report has been signed electronically.

## 2017-04-03 NOTE — Progress Notes (Signed)
Called to room to assist during endoscopic procedure.  Patient ID and intended procedure confirmed with present staff. Received instructions for my participation in the procedure from the performing physician.  

## 2017-04-03 NOTE — Patient Instructions (Signed)
YOU HAD AN ENDOSCOPIC PROCEDURE TODAY AT [facility]:   Refer to the procedure report that was given to you for any specific questions about what was found during the examination.  If the procedure report does not answer your questions, please call your gastroenterologist to clarify.  If you requested that your care partner not be given the details of your procedure findings, then the procedure report has been included in a sealed envelope for you to review at your convenience later.  YOU SHOULD EXPECT: Some feelings of bloating in the abdomen. Passage of more gas than usual.  Walking can help get rid of the air that was put into your GI tract during the procedure and reduce the bloating.   Please Note:  You might notice some irritation and congestion in your nose or some drainage.  This is from the oxygen used during your procedure.  There is no need for concern and it should clear up in a day or so.  SYMPTOMS TO REPORT IMMEDIATELY:  Following upper endoscopy (EGD)  Vomiting of blood or coffee ground material  New chest pain or pain under the shoulder blades  Painful or persistently difficult swallowing  New shortness of breath  Fever of 100F or higher  Black, tarry-looking stools  For urgent or emergent issues, a gastroenterologist can be reached at any hour by calling [phone].   DIET:  We do recommend a small meal at first, but then you may proceed to your regular diet.  Drink plenty of fluids but you should avoid alcoholic beverages for 24 hours.  ACTIVITY:  You should plan to take it easy for the rest of today and you should NOT DRIVE or use heavy machinery until tomorrow (because of the sedation medicines used during the test).    FOLLOW UP: Our staff will call the number listed on your records the next business day following your procedure to check on you and address any questions or concerns that you may have regarding the information given to you following  your procedure. If we do not reach you, we will leave a message.  However, if you are feeling well and you are not experiencing any problems, there is no need to return our call.  We will assume that you have returned to your regular daily activities without incident.  If any biopsies were taken you will be contacted by phone or by letter within the next 1-3 weeks.  Please call us at [phone] if you have not heard about the biopsies in 3 weeks.   SIGNATURES/CONFIDENTIALITY: You and/or your care partner have signed paperwork which will be entered into your electronic medical record.  These signatures attest to the fact that that the information above on your After Visit Summary has been reviewed and is understood.  Full responsibility of the confidentiality of this discharge information lies with you and/or your care-partner.  Await pathology  Continue your normal medications  Avoid lactose, high fructose corn syrup, soda and artificial sweeteners

## 2017-04-03 NOTE — Progress Notes (Signed)
Report given to PACU, vss 

## 2017-04-04 ENCOUNTER — Telehealth: Payer: Self-pay | Admitting: *Deleted

## 2017-04-04 NOTE — Telephone Encounter (Signed)
  Follow up Call-  Call back number 04/03/2017 12/11/2016  Post procedure Call Back phone  # [phone]  Permission to leave phone message Yes Yes  Some recent data might be hidden     Patient questions:  Do you have a fever, pain , or abdominal swelling? No. Pain Score  0 *  Have you tolerated food without any problems? Yes.    Have you been able to return to your normal activities? Yes.    Do you have any questions about your discharge instructions: Diet   No. Medications  No. Follow up visit  No.  Do you have questions or concerns about your Care? No.  Actions: * If pain score is 4 or above: No action needed, pain <4.

## 2017-04-04 NOTE — Progress Notes (Signed)
Patient did have possible pancreatic insufficiency.  Follow-up fecal elastase and also consider empiric trial with Creon Reviewed and agree with documentation and assessment and plan. Damaris Hippo , MD

## 2017-04-07 NOTE — Progress Notes (Signed)
Can you order fecal elastase please. Thanks-JLL

## 2017-04-07 NOTE — Addendum Note (Signed)
Addended by: Jeoffrey Massed on: 04/07/2017 09:32 AM   Modules accepted: Orders

## 2017-04-08 ENCOUNTER — Encounter: Payer: Self-pay | Admitting: Gastroenterology

## 2017-04-11 ENCOUNTER — Ambulatory Visit: Payer: BLUE CROSS/BLUE SHIELD | Admitting: Physician Assistant

## 2017-04-11 ENCOUNTER — Encounter: Payer: Self-pay | Admitting: Physician Assistant

## 2017-04-11 ENCOUNTER — Other Ambulatory Visit: Payer: BLUE CROSS/BLUE SHIELD

## 2017-04-11 VITALS — BP 120/68 | HR 80 | Ht 61.0 in | Wt 121.0 lb

## 2017-04-11 DIAGNOSIS — R1084 Generalized abdominal pain: Secondary | ICD-10-CM

## 2017-04-11 DIAGNOSIS — R6881 Early satiety: Secondary | ICD-10-CM

## 2017-04-11 DIAGNOSIS — R152 Fecal urgency: Secondary | ICD-10-CM

## 2017-04-11 DIAGNOSIS — R634 Abnormal weight loss: Secondary | ICD-10-CM

## 2017-04-11 DIAGNOSIS — R197 Diarrhea, unspecified: Secondary | ICD-10-CM

## 2017-04-11 DIAGNOSIS — R945 Abnormal results of liver function studies: Secondary | ICD-10-CM

## 2017-04-11 DIAGNOSIS — R109 Unspecified abdominal pain: Secondary | ICD-10-CM

## 2017-04-11 DIAGNOSIS — R7989 Other specified abnormal findings of blood chemistry: Secondary | ICD-10-CM

## 2017-04-11 DIAGNOSIS — R194 Change in bowel habit: Secondary | ICD-10-CM

## 2017-04-11 NOTE — Patient Instructions (Signed)
  You have been scheduled for a CT scan of the abdomen and pelvis at [facility], [place].  You are scheduled on Tuesday 04-22-2017 at 4:00 PM. You should arrive at 3:45 PM  to your appointment time for registration. Please follow the written instructions below on the day of your exam:  WARNING: IF YOU ARE ALLERGIC TO IODINE/X-RAY DYE, PLEASE NOTIFY RADIOLOGY IMMEDIATELY AT [phone]! YOU WILL BE GIVEN A 13 HOUR PREMEDICATION PREP.  1) Do not eat anything after 12:00 noon (4 hours prior to your test) 2) You have been given 2 bottles of oral contrast to drink. The solution may taste               better if refrigerated, but do NOT add ice or any other liquid to this solution. Shake             well before drinking.    Drink 1 bottle of contrast @ 2:00 Pm (2 hours prior to your exam)  Drink 1 bottle of contrast @ 3:00 PM (1 hour prior to your exam)  You may take any medications as prescribed with a small amount of water except for the following: Metformin, Glucophage, Glucovance, Avandamet, Riomet, Fortamet, Actoplus Met, Janumet, Glumetza or Metaglip. The above medications must be held the day of the exam AND 48 hours after the exam.  The purpose of you drinking the oral contrast is to aid in the visualization of your intestinal tract. The contrast solution may cause some diarrhea. Before your exam is started, you will be given a small amount of fluid to drink. Depending on your individual set of symptoms, you may also receive an intravenous injection of x-ray contrast/dye. Plan on being at [facility] for 30 minutes or long, depending on the type of exam you are having performed.  If you have any questions regarding your exam or if you need to reschedule, you may call the CT department at [phone] between the hours of 8:00 am and 5:00 pm, Monday-Friday.  ________________________________________________________________________

## 2017-04-11 NOTE — Progress Notes (Signed)
Subjective:    Patient ID: Diane Gomez, female    DOB: August 20, 1955, 61 y.o.   MRN: [id number]  HPI Diane Gomez is a 61 year old white female, recently established with Dr. Silverio Decamp, when she underwent screening colonoscopy in July 2018. She was found to have multiple diverticuli in the left colon and 2 small polyps were removed one of which was a tubular adenoma. Patient was then seen by Ellouise Newer PA-C on 04/01/2017 with multiple GI complaints including weight loss of about 15 pounds over the past year, early satiety, postprandial abdominal discomfort, and intermittent episodes of fecal urgency and occasional episodes of incontinence. She had also recently been noted to have elevated LFTs. She had been drinking 3-4 shots of vodka per day. LFTs were repeated and within normal limits. Hepatitis serologies also negative for hepatitis A B and C. She was scheduled for upper endoscopy which was done on 04/03/2017 and normal. Small bowel biopsies were done to rule out celiac disease and these were also negative. She comes in today for follow-up. She's been reading a lot on the Internet. She says she continues to lose weight and is probably lost 20 pounds pounds over the past year. Appetite sounds like she had a stressful Silar with problems with her blood pressure medication which caused her to feel very poorly and this may have contributed to some of the weight loss. She says when she does eat she gets full quickly and nose that she has to stop. She says she has no appetite most of the time and often has to force herself to eat. She has not been vomiting. She is taking Saccharomyces daily and says that's helping her bowels. She's only had one episode of urgency over the past week or so. She has a lot of questions about her workup to date, and why we don't have an answer for her. She brought her stool specimens back today to be submitted for fecal elastase.  Review of Systems Pertinent positive and  negative review of systems were noted in the above HPI section.  All other review of systems was otherwise negative.  Outpatient Encounter Medications as of 04/11/2017  Medication Sig  . bifidobacterium infantis (ALIGN) capsule Take 1 capsule daily by mouth.  . Calcium Carbonate-Vitamin D (CALCIUM + D PO) Take 2 tablets daily by mouth.   . diltiazem (TIAZAC) 300 MG 24 hr capsule Take 300 mg by mouth daily.   Marland Kitchen esomeprazole (NEXIUM) 40 MG capsule Take 20 mg by mouth daily before breakfast.   . hydrochlorothiazide (HYDRODIURIL) 25 MG tablet Take 25 mg by mouth daily as needed.  Marland Kitchen lisinopril (PRINIVIL,ZESTRIL) 10 MG tablet TAKE 1 TABLET (10 MG TOTAL) BY MOUTH DAILY.  . metoprolol succinate (TOPROL-XL) 50 MG 24 hr tablet Take 1 tablet by mouth daily.   . psyllium (METAMUCIL) 58.6 % powder Take 1 packet 3 (three) times daily by mouth.   Facility-Administered Encounter Medications as of 04/11/2017  Medication  . 0.9 %  sodium chloride infusion  . 0.9 %  sodium chloride infusion   Allergies  Allergen Reactions  . Penicillins Hives  . Other     Cats and horses   Patient Active Problem List   Diagnosis Date Noted  . Anxiety 12/12/2016  . Allergy-induced asthma 12/12/2016  . Gastroesophageal reflux disease 06/27/2014  . Heart murmur 06/27/2014  . History of nonmelanoma skin cancer 06/27/2014  . Hypertension   . Osteoporosis    Social History   Socioeconomic History  .  Marital status: Married    Spouse name: Not on file  . Number of children: Not on file  . Years of education: Not on file  . Highest education level: Not on file  Social Needs  . Financial resource strain: Not on file  . Food insecurity - worry: Not on file  . Food insecurity - inability: Not on file  . Transportation needs - medical: Not on file  . Transportation needs - non-medical: Not on file  Occupational History  . Not on file  Tobacco Use  . Smoking status: Former Smoker    Types: Cigarettes  .  Smokeless tobacco: Never Used  Substance and Sexual Activity  . Alcohol use: Yes    Alcohol/week: 8.4 oz    Types: 7 Shots of liquor, 7 Standard drinks or equivalent per week    Comment: moderate..."at least one drink per day" Vodka per pt  . Drug use: Yes    Frequency: 2.0 times per week    Types: Marijuana    Comment: occasional   . Sexual activity: Not Currently    Birth control/protection: Post-menopausal, Surgical  Other Topics Concern  . Not on file  Social History Narrative  . Not on file    Diane Gomez's family history includes Aneurysm in her father; Breast cancer in her other; Heart disease in her brother; Hypertension in her mother; Parkinsonism in her sister; Seizures in her sister.      Objective:    Vitals:   04/11/17 1510  BP: 120/68  Pulse: 80    Physical Exam  well-developed older white female in no acute distress, accompanied by her sister blood pressure 120/68, pulse 80, height 5 foot 1, weight 121. HEENT; nontraumatic normocephalic EOMI PERRLA sclera anicteric, Cardiovascular ;regular rate and rhythm with S1-S2 no murmur rub or gallop, Pulmonary; clear bilaterally, Abdomen; soft, no palpable mass or double hepatosplenomegaly,, bowel sounds are present no audible bruit, Rectal ;exam not done, Ext; no clubbing cyanosis or edema skin warm and dry, Neuropsych; mood and affect appropriate       Assessment & Plan:   #74 61 year old white female with weight loss of 20 pounds over the past 1 year, anorexia, early satiety, postprandial abdominal discomfort and occasional fecal urgency including a few episodes of incontinence. Recent colonoscopy July 2018 with finding of multiple left colon diverticuli, and 2 polyps removed one of which was a tubular adenoma EGD last week normal and small bowel biopsies negative Etiology of her symptoms is not clear, need to rule out underlying neoplasm, consider pancreatic disease chronic pancreatitis pancreatic insufficiency.  Patient is also at risk for underlying liver disease with daily EtOH use. Consider mesenteric insufficiency #2 GERD-stable on Nexium #3 hypertension #4 daily EtOH use chronic  Plan; Patient will be scheduled for CT of the abdomen and pelvis with contrast Await results of fecal elastase Continue daily probiotic/Saccharomyces If above studies are unrevealing consider gastric emptying scan  Diane Gomez S Diane Atiyeh PA-C 04/11/2017   Cc: Jearld Fenton, NP

## 2017-04-14 NOTE — Progress Notes (Signed)
Reviewed and agree with documentation and assessment and plan. K. Veena Gilberte Gorley , MD   

## 2017-04-21 LAB — PANCREATIC ELASTASE, FECAL: Pancreatic Elastase-1, Stool: >500 mcg/g

## 2017-04-22 ENCOUNTER — Ambulatory Visit
Admission: RE | Admit: 2017-04-22 | Discharge: 2017-04-22 | Disposition: A | Discharge status: Home / Self Care | Payer: BLUE CROSS/BLUE SHIELD | Source: Ambulatory Visit | Attending: Physician Assistant | Admitting: Physician Assistant

## 2017-04-22 DIAGNOSIS — R152 Fecal urgency: Secondary | ICD-10-CM | POA: Diagnosis not present

## 2017-04-22 DIAGNOSIS — R6881 Early satiety: Secondary | ICD-10-CM | POA: Diagnosis present

## 2017-04-22 DIAGNOSIS — K5730 Diverticulosis of large intestine without perforation or abscess without bleeding: Secondary | ICD-10-CM | POA: Diagnosis not present

## 2017-04-22 DIAGNOSIS — N200 Calculus of kidney: Secondary | ICD-10-CM | POA: Diagnosis not present

## 2017-04-22 DIAGNOSIS — R634 Abnormal weight loss: Secondary | ICD-10-CM

## 2017-04-22 DIAGNOSIS — I700 Atherosclerosis of aorta: Secondary | ICD-10-CM | POA: Diagnosis not present

## 2017-04-22 DIAGNOSIS — R1084 Generalized abdominal pain: Secondary | ICD-10-CM | POA: Insufficient documentation

## 2017-04-22 MED ORDER — IOPAMIDOL (ISOVUE-300) INJECTION 61%
100.0000 mL | Freq: Once | INTRAVENOUS | Status: AC | PRN
  Administered 2017-04-22: 100 mL via INTRAVENOUS

## 2017-04-23 ENCOUNTER — Other Ambulatory Visit: Payer: Self-pay

## 2017-04-23 DIAGNOSIS — N2889 Other specified disorders of kidney and ureter: Secondary | ICD-10-CM

## 2017-04-23 DIAGNOSIS — K769 Liver disease, unspecified: Secondary | ICD-10-CM

## 2017-04-23 DIAGNOSIS — R634 Abnormal weight loss: Secondary | ICD-10-CM

## 2017-05-01 ENCOUNTER — Ambulatory Visit
Admission: RE | Admit: 2017-05-01 | Discharge: 2017-05-01 | Disposition: A | Discharge status: Home / Self Care | Payer: BLUE CROSS/BLUE SHIELD | Source: Ambulatory Visit | Attending: Physician Assistant | Admitting: Physician Assistant

## 2017-05-01 ENCOUNTER — Ambulatory Visit: Admission: RE | Admit: 2017-05-01 | Payer: BLUE CROSS/BLUE SHIELD | Source: Ambulatory Visit

## 2017-05-01 DIAGNOSIS — K760 Fatty (change of) liver, not elsewhere classified: Secondary | ICD-10-CM | POA: Diagnosis not present

## 2017-05-01 DIAGNOSIS — K769 Liver disease, unspecified: Secondary | ICD-10-CM | POA: Insufficient documentation

## 2017-05-01 DIAGNOSIS — I700 Atherosclerosis of aorta: Secondary | ICD-10-CM | POA: Insufficient documentation

## 2017-05-01 DIAGNOSIS — N289 Disorder of kidney and ureter, unspecified: Secondary | ICD-10-CM | POA: Insufficient documentation

## 2017-05-01 DIAGNOSIS — N2889 Other specified disorders of kidney and ureter: Secondary | ICD-10-CM

## 2017-05-01 MED ORDER — GADOBENATE DIMEGLUMINE 529 MG/ML IV SOLN
10.0000 mL | Freq: Once | INTRAVENOUS | Status: AC | PRN
  Administered 2017-05-01: 10 mL via INTRAVENOUS

## 2017-05-15 ENCOUNTER — Ambulatory Visit: Payer: BLUE CROSS/BLUE SHIELD | Admitting: Internal Medicine

## 2017-05-15 ENCOUNTER — Encounter: Payer: Self-pay | Admitting: Internal Medicine

## 2017-05-15 VITALS — BP 132/80 | HR 78 | Temp 98.2°F | Wt 120.0 lb

## 2017-05-15 DIAGNOSIS — R935 Abnormal findings on diagnostic imaging of other abdominal regions, including retroperitoneum: Secondary | ICD-10-CM

## 2017-05-15 DIAGNOSIS — R634 Abnormal weight loss: Secondary | ICD-10-CM

## 2017-05-15 DIAGNOSIS — R609 Edema, unspecified: Secondary | ICD-10-CM

## 2017-05-15 DIAGNOSIS — R1084 Generalized abdominal pain: Secondary | ICD-10-CM

## 2017-05-15 DIAGNOSIS — R6881 Early satiety: Secondary | ICD-10-CM | POA: Diagnosis not present

## 2017-05-15 DIAGNOSIS — R600 Localized edema: Secondary | ICD-10-CM

## 2017-05-15 MED ORDER — SPIRONOLACTONE 25 MG PO TABS: 12.5000 mg | ORAL_TABLET | Freq: Every day | ORAL | 1 refills | Status: DC

## 2017-05-15 NOTE — Progress Notes (Signed)
Subjective:    Patient ID: Diane Gomez, female    DOB: 03/18/1956, 61 y.o.   MRN: [id number]  HPI  Pt presents to the clinic today to follow up her MRI of the abdomen. She went to see GI after complaints of weight loss, early satiety and postprandial abdominal pain since her colonoscopy in July. CT abdomen showed:  IMPRESSION: 1. There is a new abnormal 2.6 cm hypodense lesion in segment 8 of the liver. Possibilities might include focal fatty infiltration, unusual hemangioma, or malignancy. Hepatic protocol MRI is recommended for further workup. 2. Small lesion in segment 6 of the liver was probably present in 2008 but obscured by streak artifact. Long-term persistence indicates a likely benign lesion. 3. Nonspecific 1.3 cm nodule in the left adrenal gland. This could also be further assessed at the time of MRI. 4. 6 mm left mid kidney calculus within a small calyceal diverticulum below a region of renal scarring. 5. New or enlarged 1.1 by 0.8 cm hypodense lesion in the left mid to lower kidney, probably a cyst but technically nonspecific due to small size. This also could be further characterized at the time of MRI. 6. Scattered descending and sigmoid colon diverticula without active diverticulitis. 7.  Aortic Atherosclerosis (ICD10-I70.0).  Follow up MRI was ordered which showed:  IMPRESSION: 1. The hepatic, left adrenal, and left renal lesions all have benign imaging characteristics and do not require further workup. 2.  Aortic Atherosclerosis (ICD10-I70.0).  She is concerned that the CT scan showed all these abnormalities but was told her MRI is normal. She has multiple questions regarding the CT scan and MRI.  She also reports persistent lower extremity edema. She reports her feet swell every day. She denies chronic cough or shortness of breath. The swelling is not painful. She has tried HCTZ in the past but reports she does not like to take it because she has severe muscle  cramping secondary to potassium loss. She does not like to take the potassium supplement because the pills are to big. She reports the swelling resolves with elevation.  Review of Systems      Past Medical History:  Diagnosis Date  . Anxiety   . Asthma    from cats and horses only  . Cancer ([place]) 15 years ago   melanoma   . Fibroid    SUBMUCOUS MYOMA  . GERD (gastroesophageal reflux disease)   . Heart murmur   . Hypertension   . Osteoporosis     Current Outpatient Medications  Medication Sig Dispense Refill  . bifidobacterium infantis (ALIGN) capsule Take 1 capsule daily by mouth.    . Calcium Carbonate-Vitamin D (CALCIUM + D PO) Take 2 tablets daily by mouth.     . diltiazem (TIAZAC) 300 MG 24 hr capsule Take 300 mg by mouth daily.     Marland Kitchen esomeprazole (NEXIUM) 40 MG capsule Take 20 mg by mouth daily before breakfast.     . hydrochlorothiazide (HYDRODIURIL) 25 MG tablet Take 25 mg by mouth daily as needed.    Marland Kitchen lisinopril (PRINIVIL,ZESTRIL) 10 MG tablet TAKE 1 TABLET (10 MG TOTAL) BY MOUTH DAILY. 30 tablet 0  . metoprolol succinate (TOPROL-XL) 50 MG 24 hr tablet Take 1 tablet by mouth daily.     . psyllium (METAMUCIL) 58.6 % powder Take 1 packet 3 (three) times daily by mouth.     Current Facility-Administered Medications  Medication Dose Route Frequency Provider Last Rate Last Dose  . 0.9 %  sodium  chloride infusion  500 mL Intravenous Continuous Nandigam, Kavitha V, MD      . 0.9 %  sodium chloride infusion  500 mL Intravenous Continuous Nandigam, Venia Minks, MD        Allergies  Allergen Reactions  . Penicillins Hives  . Other     Cats and horses    Family History  Problem Relation Age of Onset  . Hypertension Mother   . Parkinsonism Sister        PARKINSON'S DISEASE  . Breast cancer Other        Niece  Age 76  . Aneurysm Father   . Seizures Sister   . Heart disease Brother   . Colon cancer Neg Hx   . Stomach cancer Neg Hx   . Esophageal cancer Neg Hx      Social History   Socioeconomic History  . Marital status: Married    Spouse name: Not on file  . Number of children: Not on file  . Years of education: Not on file  . Highest education level: Not on file  Social Needs  . Financial resource strain: Not on file  . Food insecurity - worry: Not on file  . Food insecurity - inability: Not on file  . Transportation needs - medical: Not on file  . Transportation needs - non-medical: Not on file  Occupational History  . Not on file  Tobacco Use  . Smoking status: Former Smoker    Types: Cigarettes  . Smokeless tobacco: Never Used  Substance and Sexual Activity  . Alcohol use: Yes    Alcohol/week: 8.4 oz    Types: 7 Shots of liquor, 7 Standard drinks or equivalent per week    Comment: moderate..."at least one drink per day" Vodka per pt  . Drug use: Yes    Frequency: 2.0 times per week    Types: Marijuana    Comment: occasional   . Sexual activity: Not Currently    Birth control/protection: Post-menopausal, Surgical  Other Topics Concern  . Not on file  Social History Narrative  . Not on file     Constitutional: Pt reports weight loss. Denies fever, malaise, fatigue, headache or abrupt weight changes.  HEENT: Denies eye pain, eye redness, ear pain, ringing in the ears, wax buildup, runny nose, nasal congestion, bloody nose, or sore throat. Respiratory: Denies difficulty breathing, shortness of breath, cough or sputum production.   Cardiovascular: Pt reports swelling in legs. Denies chest pain, chest tightness, palpitations or swelling in the hands.  Gastrointestinal: Pt reports early satiety and generalized abdominal pain. Denies bloating, constipation, diarrhea or blood in the stool.  GU: Denies urgency, frequency, pain with urination, burning sensation, blood in urine, odor or discharge. Skin: Denies redness, rashes, lesions or ulcercations.    No other specific complaints in a complete review of systems (except as  listed in HPI above).  Objective:   Physical Exam  BP 132/80   Pulse 78   Temp 98.2 F (36.8 C) (Oral)   Wt 120 lb (54.4 kg)   SpO2 98%   BMI 22.67 kg/m  Wt Readings from Last 3 Encounters:  05/15/17 120 lb (54.4 kg)  04/11/17 121 lb (54.9 kg)  04/03/17 118 lb (53.5 kg)    General: Appears her stated age, well developed, well nourished in NAD. Skin: Warm, dry and intact. Neck:  Neck supple, trachea midline. No masses, lumps or thyromegaly present.  Cardiovascular: Normal rate and rhythm. S1,S2 noted.  No murmur, rubs or  gallops noted. Trace BLE edema.  Pulmonary/Chest: Normal effort and positive vesicular breath sounds. No respiratory distress. No wheezes, rales or ronchi noted.  Abdomen: Soft and generally tender. Normal bowel sounds. No distention or masses noted. Liver, spleen and kidneys non palpable. Neurological: Alert and oriented.  Psychiatric: She is anxious appearing today.  BMET    Component Value Date/Time   NA 141 03/27/2017 1130   NA 137 07/08/2013 1149   K 3.6 03/27/2017 1130   K 3.3 (L) 07/08/2013 1149   CL 98 03/27/2017 1130   CL 105 07/08/2013 1149   CO2 30 03/27/2017 1130   CO2 22 07/08/2013 1149   GLUCOSE 90 03/27/2017 1130   GLUCOSE 95 07/08/2013 1149   BUN 9 03/27/2017 1130   BUN 8 07/08/2013 1149   CREATININE 0.73 03/27/2017 1130   CREATININE 0.66 07/08/2013 1149   CALCIUM 10.5 03/27/2017 1130   CALCIUM 10.0 07/08/2013 1149   GFRNONAA >60 07/08/2013 1149   GFRAA >60 07/08/2013 1149    Lipid Panel     Component Value Date/Time   CHOL 285 (H) 02/18/2017 0908   TRIG 104.0 02/18/2017 0908   HDL 137.00 02/18/2017 0908   CHOLHDL 2 02/18/2017 0908   VLDL 20.8 02/18/2017 0908   LDLCALC 127 (H) 02/18/2017 0908    CBC    Component Value Date/Time   WBC 4.7 02/18/2017 0908   RBC 3.64 (L) 02/18/2017 0908   HGB 12.8 02/18/2017 0908   HGB 14.3 07/08/2013 1149   HCT 38.2 02/18/2017 0908   HCT 42.1 07/08/2013 1149   PLT 244.0 02/18/2017  0908   PLT 253 07/08/2013 1149   MCV 105.0 (H) 02/18/2017 0908   MCV 96 07/08/2013 1149   MCH 32.8 07/08/2013 1149   MCH 32.2 10/21/2012 0844   MCHC 33.4 02/18/2017 0908   RDW 15.3 02/18/2017 0908   RDW 14.3 07/08/2013 1149   LYMPHSABS 1.1 10/21/2012 0844   MONOABS 0.4 10/21/2012 0844   EOSABS 0.1 10/21/2012 0844   BASOSABS 0.0 10/21/2012 0844    Hgb A1C No results found for: HGBA1C    '      Assessment & Plan:   Abnormal CT Scan, Abnormal MRI of Abdomen:  Results reviewed in Epic and with patient All of her questions were answered Reassured her that the findings are incidental and if there was any concern for malignancy, that the radiologist would have suggested biopsy or repeat scanning  Early Satiety, Weight Loss and Abdominal Pain:  She thinks she may have IBS There is no significant weight loss noted in Epic She will either follow back up with [facility] or she will let me know if she wants referral for a second opinion. Monitor symptoms for now. Continue Nexium  Peripheral Edema:  Will trial Spironolactone 12.5 mg daily Discussed the risk of hyperkalemia with that and Lisinopril She will come to the lab in 10 days for potassium check.  Return precautions discussed Webb Silversmith, NP

## 2017-05-16 NOTE — Patient Instructions (Signed)
Edema Edema is when you have too much fluid in your body or under your skin. Edema may make your legs, feet, and ankles swell up. Swelling is also common in looser tissues, like around your eyes. This is a common condition. It gets more common as you get older. There are many possible causes of edema. Eating too much salt (sodium) and being on your feet or sitting for a long time can cause edema in your legs, feet, and ankles. Hot weather may make edema worse. Edema is usually painless. Your skin may look swollen or shiny. Follow these instructions at home:  Keep the swollen body part raised (elevated) above the level of your heart when you are sitting or lying down.  Do not sit still or stand for a long time.  Do not wear tight clothes. Do not wear garters on your upper legs.  Exercise your legs. This can help the swelling go down.  Wear elastic bandages or support stockings as told by your doctor.  Eat a low-salt (low-sodium) diet to reduce fluid as told by your doctor.  Depending on the cause of your swelling, you may need to limit how much fluid you drink (fluid restriction).  Take over-the-counter and prescription medicines only as told by your doctor. Contact a doctor if:  Treatment is not working.  You have heart, liver, or kidney disease and have symptoms of edema.  You have sudden and unexplained weight gain. Get help right away if:  You have shortness of breath or chest pain.  You cannot breathe when you lie down.  You have pain, redness, or warmth in the swollen areas.  You have heart, liver, or kidney disease and get edema all of a sudden.  You have a fever and your symptoms get worse all of a sudden. Summary  Edema is when you have too much fluid in your body or under your skin.  Edema may make your legs, feet, and ankles swell up. Swelling is also common in looser tissues, like around your eyes.  Raise (elevate) the swollen body part above the level of your  heart when you are sitting or lying down.  Follow your doctor's instructions about diet and how much fluid you can drink (fluid restriction). This information is not intended to replace advice given to you by your health care provider. Make sure you discuss any questions you have with your health care provider. Document Released: 10/30/2007 Document Revised: 05/31/2016 Document Reviewed: 05/31/2016 Elsevier Interactive Patient Education  2017 [employer] Inc[employer]  

## 2017-05-21 ENCOUNTER — Telehealth: Payer: Self-pay | Admitting: Internal Medicine

## 2017-05-21 NOTE — Telephone Encounter (Signed)
Spoke to pt. She said she stopped taking the spironolactone because of the swelling. She said she is not voiding as she thinks she should and has been on it for 5 days. She said she was having such bad leg cramps with the HCTZ. She was taking potassium with the HCTZ. I advised her that I could not authorize her taking the HCTZ and Dr Silvio Pate does not know her history enough. She is just concerned with the facial/eye swelling. Could this be a reaction to the spironolactone?

## 2017-05-21 NOTE — Telephone Encounter (Signed)
Spoke to pt. She said she has started to itch everywhere. She has not done anything different than spironolactone, but as she stated before, she did not take it today. She will try an antihistamine when she gets home today.

## 2017-05-21 NOTE — Telephone Encounter (Signed)
Possibly but that's not a more common side effect to spironolactone. May go back to hctz with potassium, route to PCP as fyi.

## 2017-05-21 NOTE — Telephone Encounter (Signed)
Please let her know that the spironolactone doesn't work as fast but may do the job if she gives it more time. Send note to [place] after calling her

## 2017-05-21 NOTE — Telephone Encounter (Signed)
Avie Echevaria NP out of office.Please advise.

## 2017-05-21 NOTE — Telephone Encounter (Signed)
Copied from [place] [id number]. Topic: Inquiry >> May 21, 2017  9:36 AM Conception Chancy, NT wrote: Reason for CRM: patient states Baity prescribed a different fluid pill on Friday (spironolactone) and she states it is not getting the swelling out. When she wakes up in the morning she has 2 swollen eyes. She states she is going to stop taking this medication and would like to know if she can return to the old fluid pill she was taking before (hydrochlorothiazied).  Please advise and contact patient.

## 2017-05-22 NOTE — Telephone Encounter (Signed)
Noted, advised her to follow up with me, if symptoms persist.

## 2017-05-23 ENCOUNTER — Other Ambulatory Visit (INDEPENDENT_AMBULATORY_CARE_PROVIDER_SITE_OTHER): Payer: BLUE CROSS/BLUE SHIELD

## 2017-05-23 DIAGNOSIS — R609 Edema, unspecified: Secondary | ICD-10-CM | POA: Diagnosis not present

## 2017-05-23 LAB — BASIC METABOLIC PANEL
BUN: 9 mg/dL (ref 6–23)
CALCIUM: 9.4 mg/dL (ref 8.4–10.5)
CO2: 29 meq/L (ref 19–32)
CREATININE: 0.8 mg/dL (ref 0.40–1.20)
Chloride: 98 mEq/L (ref 96–112)
GFR: 76.88 mL/min (ref >60.00)
GLUCOSE: 212 mg/dL — AB (ref 70–99)
Potassium: 3.3 mEq/L — ABNORMAL LOW (ref 3.5–5.1)
Sodium: 139 mEq/L (ref 135–145)

## 2017-05-23 NOTE — Telephone Encounter (Signed)
Pt is aware.  

## 2017-06-03 ENCOUNTER — Ambulatory Visit: Payer: BLUE CROSS/BLUE SHIELD | Admitting: Women's Health

## 2017-06-03 ENCOUNTER — Encounter: Payer: Self-pay | Admitting: Women's Health

## 2017-06-03 VITALS — BP 154/82 | Ht 61.0 in | Wt 119.0 lb

## 2017-06-03 DIAGNOSIS — Z01419 Encounter for gynecological examination (general) (routine) without abnormal findings: Secondary | ICD-10-CM | POA: Diagnosis not present

## 2017-06-03 MED ORDER — POTASSIUM CHLORIDE ER 10 MEQ PO TBCR: 10.0000 meq | EXTENDED_RELEASE_TABLET | Freq: Every day | ORAL | 2 refills | Status: DC

## 2017-06-03 NOTE — Progress Notes (Signed)
Diane Gomez [birth year] [id number]    History:    Presents for annual exam.  Postmenopausal no HRT with no bleeding. 2013 osteoporosis on reclast for 4 years. Last DEXA 2013. 2008 myomectomy.  11/2016 benign colon polyp. Normal Pap and mammogram history overdue for mammogram.  Hypertension primary care manages. Complaint is explosive diarrhea, nausea, 15 pound weight loss in the past year, GI is calling IBS D. Also has diverticulosis. Not sexually active.  Past medical history, past surgical history, family history and social history were all reviewed and documented in the EPIC chart. Had numerous testing with [facility] that were normal,  was instructed to have Dexa. 2 children both doing well, continues to help grandson who is at [facility] [employer].  ROS:  A ROS was performed and pertinent positives and negatives are included.  Exam:  Vitals:   06/03/17 1057  Weight: 119 lb (54 kg)  Height: 5\' 1"  (1.549 m)   Body mass index is 22.48 kg/m.   General appearance:  Normal Thyroid:  Symmetrical, normal in size, without palpable masses or nodularity. Respiratory  Auscultation:  Clear without wheezing or rhonchi Cardiovascular  Auscultation:  Regular rate, without rubs, murmurs or gallops  Edema/varicosities:  Not grossly evident Abdominal  Soft,nontender, without masses, guarding or rebound.  Liver/spleen:  No organomegaly noted  Hernia:  None appreciated  Skin  Inspection:  Grossly normal   Breasts: Examined lying and sitting.     Right: Without masses, retractions, discharge or axillary adenopathy.     Left: Without masses, retractions, discharge or axillary adenopathy. Gentitourinary   Inguinal/mons:  Normal without inguinal adenopathy  External genitalia:  Normal  BUS/Urethra/Skene's glands:  Normal  Vagina:  Normal  Cervix:  Normal  Uterus:  normal in size, shape and contour.  Midline and mobile  Adnexa/parametria:     Rt: Without masses or  tenderness.   Lt: Without masses or tenderness.  Anus and perineum: Normal  Digital rectal exam: Normal sphincter tone without palpated masses or tenderness  Assessment/Plan:  62 y.o. D WF G4 P2 for annual exam.    Postmenopausal on no HRT with no bleeding. Osteoporosis history of Reclast in the past overdue for DEXA 2008 myomectomy Hypertension primary care manages labs and meds Numerous GI complaints, explosive diarrhea, nausea, weight loss,  Plan: Instructed to keep scheduled follow-up with GI. SBE's, overdue for mammogram strongly encouraged screening mammogram. Exercise, calcium rich foods, vitamin D 2000 daily encouraged. DEXA instructed to schedule. Home safety, fall prevention and importance of weightbearing and balance type exercise reviewed. Pap normal 2016, new screening guidelines reviewed.    [place], 10:58 AM 06/03/2017

## 2017-06-03 NOTE — Patient Instructions (Addendum)
Mammogram [phone]  dexa  Chronic Diarrhea Diarrhea is a condition in which a person passes frequent loose and watery stools. It can cause you to feel weak and dehydrated. Dehydration can make you tired and thirsty. It can also cause a dry mouth, decreased urination, and dark yellow urine. Diarrhea is a sign of another underlying problem, such as:  Infection.  Medication side effects.  Dietary intolerance, such as lactose intolerance.  Conditions such as celiac disease, irritable bowel syndrome (IBS), or inflammatory bowel disease (IBD).  In most cases, diarrhea lasts 2-3 days. Diarrhea that lasts longer than 4 weeks is called long-lasting (chronic) diarrhea. It is important that you treat your diarrhea as told by your health care provider. Follow these instructions at home: Follow these recommendations as told by your health care provider. Eating and drinking  Take an oral rehydration solution (ORS). This is a drink that is designed to keep you hydrated. It can be found at pharmacies and retail stores.  Drink clear fluids, such as water, ice chips, diluted fruit juice, and low-calorie sports drinks.  Follow the diet recommended by your health care provider. You may need to avoid foods that trigger diarrhea for you.  Avoid foods and beverages that contain a lot of sugar or caffeine.  Avoid alcohol.  Avoid spicy or fatty foods. General instructions  Drink enough fluid to keep your urine clear or pale yellow.  Wash your hands often and after each diarrhea episode. If soap and water are not available, use hand sanitizer.  Make sure that all people in your household wash their hands well and often.  Take over-the-counter and prescription medicines only as told by your health care provider.  If you were prescribed an antibiotic medicine, take it as told by your health care provider. Do not stop taking the antibiotic even if you start to feel better.  Rest at home while you  recover.  Watch your condition for any changes.  Take a warm bath to relieve any burning or pain from frequent diarrhea episodes.  Keep all follow-up visits as told by your health care provider. This is important. Contact a health care provider if:  You have a fever.  Your diarrhea gets worse or does not get better.  You have new symptoms.  You cannot drink fluids without vomiting.  You feel light-headed or dizzy.  You have a headache.  You have muscle cramps.  You have severe pain in the rectum. Get help right away if:  You have persistent vomiting.  You have chest pain.  You feel extremely weak or you faint.  You have bloody or black stools, or stools that look like tar.  You have severe pain, cramping, or bloating in your abdomen, or pain that stays in one place.  You have trouble breathing or you are breathing very quickly.  Your heart is beating very quickly.  Your skin feels cold and clammy.  You feel confused.  You have a severe headache.  You have signs of dehydration, such as: ? Dark urine, very little urine, or no urine. ? Cracked lips. ? Dry mouth. ? Sunken eyes. ? Sleepiness. ? Weakness. Summary  Chronic diarrhea is a condition in which a person passes frequent loose and watery stools for more than 4 weeks.  Diarrhea is a sign of another underlying problem.  Drink enough fluid to keep your urine clear or pale yellow to avoid dehydration.  Wash your hands often and after each diarrhea episode. If soap and  water are not available, use hand sanitizer.  It is important that you treat your diarrhea as told by your health care provider. This information is not intended to replace advice given to you by your health care provider. Make sure you discuss any questions you have with your health care provider. Document Released: 08/03/2003 Document Revised: 04/01/2016 Document Reviewed: 04/01/2016 Elsevier Interactive Patient Education  2017 [place] Maintenance for Postmenopausal Women Menopause is a normal process in which your reproductive ability comes to an end. This process happens gradually over a span of months to years, usually between the ages of 90 and 74. Menopause is complete when you have missed 12 consecutive menstrual periods. It is important to talk with your health care provider about some of the most common conditions that affect postmenopausal women, such as heart disease, cancer, and bone loss (osteoporosis). Adopting a healthy lifestyle and getting preventive care can help to promote your health and wellness. Those actions can also lower your chances of developing some of these common conditions. What should I know about menopause? During menopause, you may experience a number of symptoms, such as:  Moderate-to-severe hot flashes.  Night sweats.  Decrease in sex drive.  Mood swings.  Headaches.  Tiredness.  Irritability.  Memory problems.  Insomnia.  Choosing to treat or not to treat menopausal changes is an individual decision that you make with your health care provider. What should I know about hormone replacement therapy and supplements? Hormone therapy products are effective for treating symptoms that are associated with menopause, such as hot flashes and night sweats. Hormone replacement carries certain risks, especially as you become older. If you are thinking about using estrogen or estrogen with progestin treatments, discuss the benefits and risks with your health care provider. What should I know about heart disease and stroke? Heart disease, heart attack, and stroke become more likely as you age. This may be due, in part, to the hormonal changes that your body experiences during menopause. These can affect how your body processes dietary fats, triglycerides, and cholesterol. Heart attack and stroke are both medical emergencies. There are many things that you can do to help prevent heart  disease and stroke:  Have your blood pressure checked at least every 1-2 years. High blood pressure causes heart disease and increases the risk of stroke.  If you are 65-66 years old, ask your health care provider if you should take aspirin to prevent a heart attack or a stroke.  Do not use any tobacco products, including cigarettes, chewing tobacco, or electronic cigarettes. If you need help quitting, ask your health care provider.  It is important to eat a healthy diet and maintain a healthy weight. ? Be sure to include plenty of vegetables, fruits, low-fat dairy products, and lean protein. ? Avoid eating foods that are high in solid fats, added sugars, or salt (sodium).  Get regular exercise. This is one of the most important things that you can do for your health. ? Try to exercise for at least 150 minutes each week. The type of exercise that you do should increase your heart rate and make you sweat. This is known as moderate-intensity exercise. ? Try to do strengthening exercises at least twice each week. Do these in addition to the moderate-intensity exercise.  Know your numbers.Ask your health care provider to check your cholesterol and your blood glucose. Continue to have your blood tested as directed by your health care provider.  What should I  know about cancer screening? There are several types of cancer. Take the following steps to reduce your risk and to catch any cancer development as early as possible. Breast Cancer  Practice breast self-awareness. ? This means understanding how your breasts normally appear and feel. ? It also means doing regular breast self-exams. Let your health care provider know about any changes, no matter how small.  If you are 66 or older, have a clinician do a breast exam (clinical breast exam or CBE) every year. Depending on your age, family history, and medical history, it may be recommended that you also have a yearly breast X-ray  (mammogram).  If you have a family history of breast cancer, talk with your health care provider about genetic screening.  If you are at high risk for breast cancer, talk with your health care provider about having an MRI and a mammogram every year.  Breast cancer (BRCA) gene test is recommended for women who have family members with BRCA-related cancers. Results of the assessment will determine the need for genetic counseling and BRCA1 and for BRCA2 testing. BRCA-related cancers include these types: ? Breast. This occurs in males or females. ? Ovarian. ? Tubal. This may also be called fallopian tube cancer. ? Cancer of the abdominal or pelvic lining (peritoneal cancer). ? Prostate. ? Pancreatic.  Cervical, Uterine, and Ovarian Cancer Your health care provider may recommend that you be screened regularly for cancer of the pelvic organs. These include your ovaries, uterus, and vagina. This screening involves a pelvic exam, which includes checking for microscopic changes to the surface of your cervix (Pap test).  For women ages 21-65, health care providers may recommend a pelvic exam and a Pap test every three years. For women ages 29-65, they may recommend the Pap test and pelvic exam, combined with testing for human papilloma virus (HPV), every five years. Some types of HPV increase your risk of cervical cancer. Testing for HPV may also be done on women of any age who have unclear Pap test results.  Other health care providers may not recommend any screening for nonpregnant women who are considered low risk for pelvic cancer and have no symptoms. Ask your health care provider if a screening pelvic exam is right for you.  If you have had past treatment for cervical cancer or a condition that could lead to cancer, you need Pap tests and screening for cancer for at least 20 years after your treatment. If Pap tests have been discontinued for you, your risk factors (such as having a new sexual  partner) need to be reassessed to determine if you should start having screenings again. Some women have medical problems that increase the chance of getting cervical cancer. In these cases, your health care provider may recommend that you have screening and Pap tests more often.  If you have a family history of uterine cancer or ovarian cancer, talk with your health care provider about genetic screening.  If you have vaginal bleeding after reaching menopause, tell your health care provider.  There are currently no reliable tests available to screen for ovarian cancer.  Lung Cancer Lung cancer screening is recommended for adults 43-43 years old who are at high risk for lung cancer because of a history of smoking. A yearly low-dose CT scan of the lungs is recommended if you:  Currently smoke.  Have a history of at least 30 pack-years of smoking and you currently smoke or have quit within the past 15 years. A  pack-year is smoking an average of one pack of cigarettes per day for one year.  Yearly screening should:  Continue until it has been 15 years since you quit.  Stop if you develop a health problem that would prevent you from having lung cancer treatment.  Colorectal Cancer  This type of cancer can be detected and can often be prevented.  Routine colorectal cancer screening usually begins at age 37 and continues through age 22.  If you have risk factors for colon cancer, your health care provider may recommend that you be screened at an earlier age.  If you have a family history of colorectal cancer, talk with your health care provider about genetic screening.  Your health care provider may also recommend using home test kits to check for hidden blood in your stool.  A small camera at the end of a tube can be used to examine your colon directly (sigmoidoscopy or colonoscopy). This is done to check for the earliest forms of colorectal cancer.  Direct examination of the colon  should be repeated every 5-10 years until age 39. However, if early forms of precancerous polyps or small growths are found or if you have a family history or genetic risk for colorectal cancer, you may need to be screened more often.  Skin Cancer  Check your skin from head to toe regularly.  Monitor any moles. Be sure to tell your health care provider: ? About any new moles or changes in moles, especially if there is a change in a mole's shape or color. ? If you have a mole that is larger than the size of a pencil eraser.  If any of your family members has a history of skin cancer, especially at a [place]on age, talk with your health care provider about genetic screening.  Always use sunscreen. Apply sunscreen liberally and repeatedly throughout the day.  Whenever you are outside, protect yourself by wearing long sleeves, pants, a wide-brimmed hat, and sunglasses.  What should I know about osteoporosis? Osteoporosis is a condition in which bone destruction happens more quickly than new bone creation. After menopause, you may be at an increased risk for osteoporosis. To help prevent osteoporosis or the bone fractures that can happen because of osteoporosis, the following is recommended:  If you are 44-46 years old, get at least 1,000 mg of calcium and at least 600 mg of vitamin D per day.  If you are older than age 70 but younger than age 63, get at least 1,200 mg of calcium and at least 600 mg of vitamin D per day.  If you are older than age 93, get at least 1,200 mg of calcium and at least 800 mg of vitamin D per day.  Smoking and excessive alcohol intake increase the risk of osteoporosis. Eat foods that are rich in calcium and vitamin D, and do weight-bearing exercises several times each week as directed by your health care provider. What should I know about how menopause affects my mental health? Depression may occur at any age, but it is more common as you become older. Common symptoms of  depression include:  Low or sad mood.  Changes in sleep patterns.  Changes in appetite or eating patterns.  Feeling an overall lack of motivation or enjoyment of activities that you previously enjoyed.  Frequent crying spells.  Talk with your health care provider if you think that you are experiencing depression. What should I know about immunizations? It is important that you get  and maintain your immunizations. These include:  Tetanus, diphtheria, and pertussis (Tdap) booster vaccine.  Influenza every year before the flu season begins.  Pneumonia vaccine.  Shingles vaccine.  Your health care provider may also recommend other immunizations. This information is not intended to replace advice given to you by your health care provider. Make sure you discuss any questions you have with your health care provider. Document Released: 07/05/2005 Document Revised: 12/01/2015 Document Reviewed: 02/14/2015 Elsevier Interactive Patient Education  2018 [employer].

## 2017-06-03 NOTE — Addendum Note (Signed)
Addended by: Lurlean Nanny on: 06/03/2017 05:01 PM   Modules accepted: Orders

## 2017-06-11 MED ORDER — HYDROCHLOROTHIAZIDE 25 MG PO TABS: 25.0000 mg | ORAL_TABLET | Freq: Every day | ORAL | 2 refills | Status: DC | PRN

## 2017-06-11 NOTE — Addendum Note (Signed)
Addended by: Lurlean Nanny on: 06/11/2017 11:32 AM   Modules accepted: Orders

## 2017-06-18 ENCOUNTER — Other Ambulatory Visit: Payer: Self-pay | Admitting: Gynecology

## 2017-06-18 DIAGNOSIS — Z13820 Encounter for screening for osteoporosis: Secondary | ICD-10-CM

## 2017-07-01 ENCOUNTER — Ambulatory Visit (INDEPENDENT_AMBULATORY_CARE_PROVIDER_SITE_OTHER): Payer: BLUE CROSS/BLUE SHIELD

## 2017-07-01 DIAGNOSIS — M810 Age-related osteoporosis without current pathological fracture: Secondary | ICD-10-CM | POA: Diagnosis not present

## 2017-07-01 DIAGNOSIS — Z13820 Encounter for screening for osteoporosis: Secondary | ICD-10-CM

## 2017-07-02 ENCOUNTER — Telehealth: Payer: Self-pay | Admitting: Gynecology

## 2017-07-02 ENCOUNTER — Encounter: Payer: Self-pay | Admitting: Gynecology

## 2017-07-02 ENCOUNTER — Other Ambulatory Visit: Payer: Self-pay | Admitting: Gynecology

## 2017-07-02 DIAGNOSIS — M810 Age-related osteoporosis without current pathological fracture: Secondary | ICD-10-CM

## 2017-07-02 DIAGNOSIS — Z13820 Encounter for screening for osteoporosis: Secondary | ICD-10-CM

## 2017-07-02 DIAGNOSIS — M818 Other osteoporosis without current pathological fracture: Secondary | ICD-10-CM

## 2017-07-02 NOTE — Telephone Encounter (Signed)
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  She is about the same level as she was in 2010 when she started her Reclast.  She showed improvement in 2013 but has had a decline since then.  Her hips remains stable.  2 options would be to consider treatment with another medication such as Prolia.  Second option would be to monitor for 2 years and repeat the bone density and if ongoing loss then consider treatment at that time.  Risk of not treating now is losing more bone.  Recommend office visit issue with like to further discuss her treatment options.  I would recommend having a vitamin D level checked

## 2017-07-03 NOTE — Telephone Encounter (Signed)
Pt informed with below, order placed,aware to schedule lab appointment

## 2017-07-10 ENCOUNTER — Telehealth: Payer: Self-pay | Admitting: *Deleted

## 2017-07-10 DIAGNOSIS — R6881 Early satiety: Secondary | ICD-10-CM

## 2017-07-10 DIAGNOSIS — R1084 Generalized abdominal pain: Secondary | ICD-10-CM

## 2017-07-10 DIAGNOSIS — R634 Abnormal weight loss: Secondary | ICD-10-CM

## 2017-07-10 NOTE — Telephone Encounter (Signed)
Copied from [place] [phone]. Topic: Referral - Request >> Jul 10, 2017  1:14 PM Synthia Innocent wrote: Reason for CRM: Requesting a referral to Dr Valentino Saxon, [facility], for GI 2nd opinion, weight loss

## 2017-07-14 NOTE — Telephone Encounter (Signed)
Referral placed.

## 2017-07-14 NOTE — Addendum Note (Signed)
Addended by: Jearld Fenton on: 07/14/2017 01:11 PM   Modules accepted: Orders

## 2017-09-24 ENCOUNTER — Encounter: Payer: Self-pay | Admitting: Nurse Practitioner

## 2017-09-24 ENCOUNTER — Ambulatory Visit: Payer: BLUE CROSS/BLUE SHIELD | Admitting: Nurse Practitioner

## 2017-09-24 ENCOUNTER — Encounter

## 2017-09-24 VITALS — BP 130/80 | HR 76 | Ht 61.0 in | Wt 117.2 lb

## 2017-09-24 DIAGNOSIS — R634 Abnormal weight loss: Secondary | ICD-10-CM

## 2017-09-24 DIAGNOSIS — R110 Nausea: Secondary | ICD-10-CM

## 2017-09-24 DIAGNOSIS — R197 Diarrhea, unspecified: Secondary | ICD-10-CM

## 2017-09-24 MED ORDER — ONDANSETRON HCL 4 MG PO TABS: 4.0000 mg | ORAL_TABLET | Freq: Two times a day (BID) | ORAL | 2 refills | Status: DC

## 2017-09-24 NOTE — Patient Instructions (Signed)
If you are age 64 or older, your body mass index should be between 23-30. Your Body mass index is 22.14 kg/m. If this is out of the aforementioned range listed, please consider follow up with your Primary Care Provider.  If you are age 105 or younger, your body mass index should be between 19-25. Your Body mass index is 22.14 kg/m. If this is out of the aformentioned range listed, please consider follow up with your Primary Care Provider.   We have sent the following medications to your pharmacy for you to pick up at your convenience: Zofran 4 mg   Thank you for choosing me and [facility].   Tye Savoy, NP

## 2017-09-24 NOTE — Progress Notes (Signed)
IMPRESSION and PLAN:    #1. Pleasant 62 yo female with chronic loose stool managed nicely with Align probiotic and one imodium daily. She is up to date on colon cancer screening. EGD with small bowel biopsies, celiac testing, fecal elastase and CT scan all unrevealing.  -Reassurance given regarding bowel movements. It is not unreasonable to take one imodium daily plus probiotic if this provides her with satisfactory bowel habits.   #2.  Nausea. This is interesting as the nausea occurs as she is chewing her food at mealtime. The sight nor smell of food is not really bothersome, it is just when chewing. It is possible she has some aversion to food which manifests as nausea as she is chewing her food.  She is frustrated, able to eat only small "baby" portions due to early satiety and nausea. She is concerned about all the weight loss. Based on weights in Epic her weight is down 3-4 pounds over last 6 months.  -patient has an appt with [facility] in a few days. She wants a second opinion. I think this is reasonable.  -she is interested in trying an anti-emetic. Trial of Zofran given.   3. Incidental CT scan findings. Left renal lesion / left adrenal lesion and a liver lesion seen on CT scan in November 2018.   -Tried to reassure her that incidental findings on CT scans are not uncommon and that the subsequent MRI in Dec 2018 characterized all these as benign without further workup needed.   4. Anxiety regarding health. 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 She feels that some of her health issues may be related to use of this medications. She remains very worried about weight loss, unexplained nausea, and tumors on organs described above.   HPI:    Chief Complaint: Chronic diarrhea.   Patient is a 62 year old female known to Dr. Silverio Decamp. She has a  history of diverticulosis, adenomatous colon polyps (2018), chronic diarrhea and abnormal liver  chemistries which normalized after discontinuation of alcohol.  She was last seen Nov 2018 for evaluation of weight loss, early satiety, postprandial abdominal discomfort, fecal urgency with occasional episodes of incontinence. Workup was unrevealing and included upper endoscopy with small bowel biopsies,  CT scan of the abdomen and pelvis, and fecal elastase.  CT scan did show a small abnormal area in the liver and another very small lesion of her left kidney.  To better characterize the lesions she had and MRI of the abdomen. Both lesions were described as being benign appearing.  [place] manages loose stool with one imodium daily plus Align probotics . Without the one a day imodium she will reliably have 3-4 episodes of loose stool every morning.  She is concerned about the diverticulosis found on last colonoscopy, inquires what can be done about it.   Her primary concern is nausea with meals. She gets nauseated while chewing food. The sight nor smell of food causes nausea. It starts immediately upon chewing the food. As a result she eats less than a baby portion.  Her weight is 117 pounds today, it was 120 pounds 6 months ago.  Lurlean took Valsartan for years. She has documentation of the drug causing cancer in humans. 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 Numerous tests have been unable to identify why she is having all these problems. She has scheduled an appt  for a second opinion next week with GI at [facility].    Review of systems:   Positive for swelling of feet. No chest pain, no SOB, no urinary symptoms, no fevers.     Past Medical History:  Diagnosis Date  . Anxiety   . Asthma    from cats and horses only  . Cancer ([place]) 15 years ago   melanoma   . Fibroid    SUBMUCOUS MYOMA  . GERD (gastroesophageal reflux disease)   . Heart murmur   . Hypertension   . Osteoporosis 06/2017   T score -2.6  . Tumors    liver    Patient's surgical  history, family medical history, social history, medications and allergies were all reviewed in Epic    Physical Exam:     BP 130/80   Pulse 76   Ht 5\' 1"  (1.549 m)   Wt 117 lb 3.2 oz (53.2 kg)   SpO2 97%   BMI 22.14 kg/m   GENERAL:  White female in NAD PSYCH: :Pleasant, cooperative, normal affect EENT:  conjunctiva pink, mucous membranes moist, neck supple without masses CARDIAC:  RRR, 1+ bilateral pedal edema PULM: Normal respiratory effort, lungs CTA bilaterally, no wheezing ABDOMEN:  Nondistended, soft, nontender. No obvious masses, no hepatomegaly,  normal bowel sounds SKIN:  turgor, no lesions seen Musculoskeletal:  Normal muscle tone, normal strength NEURO: Alert and oriented x 3, no focal neurologic deficits   I spent 25 minutes of face-to-face time with the patient. Greater than 50% of the time was spent counseling and coordinating care. Questions answered  Tye Savoy , NP 09/24/2017, 11:24 AM

## 2017-09-30 ENCOUNTER — Encounter: Payer: Self-pay | Admitting: Nurse Practitioner

## 2017-10-03 NOTE — Progress Notes (Signed)
Reviewed and agree with documentation and assessment and plan. K. Veena Deysi Soldo , MD   

## 2017-10-17 ENCOUNTER — Other Ambulatory Visit: Payer: Self-pay | Admitting: Internal Medicine

## 2017-11-03 ENCOUNTER — Ambulatory Visit: Payer: BLUE CROSS/BLUE SHIELD | Admitting: Internal Medicine

## 2017-11-03 ENCOUNTER — Encounter: Payer: Self-pay | Admitting: Internal Medicine

## 2017-11-03 VITALS — BP 122/60 | HR 70 | Temp 97.9°F | Wt 119.2 lb

## 2017-11-03 DIAGNOSIS — L03115 Cellulitis of right lower limb: Secondary | ICD-10-CM

## 2017-11-03 DIAGNOSIS — W57XXXA Bitten or stung by nonvenomous insect and other nonvenomous arthropods, initial encounter: Secondary | ICD-10-CM

## 2017-11-03 DIAGNOSIS — S70361A Insect bite (nonvenomous), right thigh, initial encounter: Secondary | ICD-10-CM

## 2017-11-03 MED ORDER — DOXYCYCLINE HYCLATE 100 MG PO TABS: 100.0000 mg | ORAL_TABLET | Freq: Two times a day (BID) | ORAL | 0 refills | Status: DC

## 2017-11-03 NOTE — Patient Instructions (Signed)

## 2017-11-07 ENCOUNTER — Encounter: Payer: Self-pay | Admitting: Internal Medicine

## 2017-11-07 NOTE — Progress Notes (Signed)
Subjective:    Patient ID: Diane Gomez, female    DOB: [birth year], 62 y.o.   MRN: [id number]  HPI  Pt presents to the clinic today with c/o tick bite to right thigh. She reports she pulled this off 2-3 days ago. The area is itchy. She reports increased redness and swelling. She denies headache, confusion, nausea, vomiting, joint pains or diarrhea. She has not tried anything OTC for her symptoms.   Review of Systems      Past Medical History:  Diagnosis Date  . Anxiety   . Asthma    from cats and horses only  . Cancer ([place]) 15 years ago   melanoma   . Fibroid    SUBMUCOUS MYOMA  . GERD (gastroesophageal reflux disease)   . Heart murmur   . Hypertension   . Osteoporosis 06/2017   T score -2.6  . Tumors    liver    Current Outpatient Medications  Medication Sig Dispense Refill  . bifidobacterium infantis (ALIGN) capsule Take 1 capsule daily by mouth.    . Calcium Carbonate-Vitamin D (CALCIUM + D PO) Take 2 tablets daily by mouth.     . diltiazem (TIAZAC) 300 MG 24 hr capsule Take 300 mg by mouth daily.     Marland Kitchen esomeprazole (NEXIUM) 40 MG capsule Take 20 mg by mouth daily before breakfast.     . hydrochlorothiazide (HYDRODIURIL) 25 MG tablet Take 1 tablet (25 mg total) by mouth daily as needed. 30 tablet 2  . lisinopril (PRINIVIL,ZESTRIL) 10 MG tablet TAKE 1 TABLET (10 MG TOTAL) BY MOUTH DAILY. 30 tablet 2  . metoprolol succinate (TOPROL-XL) 50 MG 24 hr tablet Take 1 tablet by mouth daily.     . ondansetron (ZOFRAN) 4 MG tablet Take 1 tablet (4 mg total) by mouth 2 (two) times daily. Take 30 minutes before eating 40 tablet 2  . potassium chloride (K-DUR) 10 MEQ tablet Take 1 tablet (10 mEq total) by mouth daily. 30 tablet 2  . doxycycline (VIBRA-TABS) 100 MG tablet Take 1 tablet (100 mg total) by mouth 2 (two) times daily. 20 tablet 0   Current Facility-Administered Medications  Medication Dose Route Frequency Provider Last Rate Last Dose  . 0.9 %  sodium chloride  infusion  500 mL Intravenous Continuous Nandigam, Kavitha V, MD      . 0.9 %  sodium chloride infusion  500 mL Intravenous Continuous Nandigam, Venia Minks, MD        Allergies  Allergen Reactions  . Penicillins Hives  . Other     Cats and horses    Family History  Problem Relation Age of Onset  . Hypertension Mother   . Parkinsonism Sister        PARKINSON'S DISEASE  . Breast cancer Other        Niece  Age 6  . Aneurysm Father   . Seizures Sister   . Heart disease Brother   . Colon cancer Neg Hx   . Stomach cancer Neg Hx   . Esophageal cancer Neg Hx     Social History   Socioeconomic History  . Marital status: Married    Spouse name: Not on file  . Number of children: Not on file  . Years of education: Not on file  . Highest education level: Not on file  Occupational History  . Not on file  Social Needs  . Financial resource strain: Not on file  . Food insecurity:    Worry: Not  on file    Inability: Not on file  . Transportation needs:    Medical: Not on file    Non-medical: Not on file  Tobacco Use  . Smoking status: Former Smoker    Types: Cigarettes  . Smokeless tobacco: Never Used  Substance and Sexual Activity  . Alcohol use: Yes    Alcohol/week: 8.4 oz    Types: 7 Shots of liquor, 7 Standard drinks or equivalent per week    Comment: moderate..."at least one drink per day" Vodka per pt  . Drug use: Yes    Frequency: 2.0 times per week    Types: Marijuana    Comment: occasional   . Sexual activity: Not Currently    Birth control/protection: Post-menopausal, Surgical  Lifestyle  . Physical activity:    Days per week: Not on file    Minutes per session: Not on file  . Stress: Not on file  Relationships  . Social connections:    Talks on phone: Not on file    Gets together: Not on file    Attends religious service: Not on file    Active member of club or organization: Not on file    Attends meetings of clubs or organizations: Not on file     Relationship status: Not on file  . Intimate partner violence:    Fear of current or ex partner: Not on file    Emotionally abused: Not on file    Physically abused: Not on file    Forced sexual activity: Not on file  Other Topics Concern  . Not on file  Social History Narrative  . Not on file     Constitutional: Denies fever, malaise, fatigue, headache or abrupt weight changes.  Respiratory: Denies difficulty breathing, shortness of breath, cough or sputum production.   Cardiovascular: Denies chest pain, chest tightness, palpitations or swelling in the hands or feet.  Gastrointestinal: Denies abdominal pain, bloating, constipation, diarrhea or blood in the stool.  Musculoskeletal: Denies decrease in range of motion, difficulty with gait, muscle pain or joint pain and swelling.  Skin: Pt reports tick bite or right thigh. Denies ulcercations.  Neurological: Denies dizziness, difficulty with memory, difficulty with speech or problems with balance and coordination.    No other specific complaints in a complete review of systems (except as listed in HPI above).  Objective:   Physical Exam   BP 122/60   Pulse 70   Temp 97.9 F (36.6 C) (Oral)   Wt 119 lb 4 oz (54.1 kg)   SpO2 96%   BMI 22.53 kg/m  Wt Readings from Last 3 Encounters:  11/03/17 119 lb 4 oz (54.1 kg)  09/24/17 117 lb 3.2 oz (53.2 kg)  06/03/17 119 lb (54 kg)    General: Appears her stated age, well developed, well nourished in NAD. Skin: Warm, dry and intact. 1 cm x 2 cm area of cellulitis noted of right thigh.  BMET    Component Value Date/Time   NA 139 05/23/2017 1046   NA 137 07/08/2013 1149   K 3.3 (L) 05/23/2017 1046   K 3.3 (L) 07/08/2013 1149   CL 98 05/23/2017 1046   CL 105 07/08/2013 1149   CO2 29 05/23/2017 1046   CO2 22 07/08/2013 1149   GLUCOSE 212 (H) 05/23/2017 1046   GLUCOSE 95 07/08/2013 1149   BUN 9 05/23/2017 1046   BUN 8 07/08/2013 1149   CREATININE 0.80 05/23/2017 1046    CREATININE 0.66 07/08/2013 1149  CALCIUM 9.4 05/23/2017 1046   CALCIUM 10.0 07/08/2013 1149   GFRNONAA >60 07/08/2013 1149   GFRAA >60 07/08/2013 1149    Lipid Panel     Component Value Date/Time   CHOL 285 (H) 02/18/2017 0908   TRIG 104.0 02/18/2017 0908   HDL 137.00 02/18/2017 0908   CHOLHDL 2 02/18/2017 0908   VLDL 20.8 02/18/2017 0908   LDLCALC 127 (H) 02/18/2017 0908    CBC    Component Value Date/Time   WBC 4.7 02/18/2017 0908   RBC 3.64 (L) 02/18/2017 0908   HGB 12.8 02/18/2017 0908   HGB 14.3 07/08/2013 1149   HCT 38.2 02/18/2017 0908   HCT 42.1 07/08/2013 1149   PLT 244.0 02/18/2017 0908   PLT 253 07/08/2013 1149   MCV 105.0 (H) 02/18/2017 0908   MCV 96 07/08/2013 1149   MCH 32.8 07/08/2013 1149   MCH 32.2 10/21/2012 0844   MCHC 33.4 02/18/2017 0908   RDW 15.3 02/18/2017 0908   RDW 14.3 07/08/2013 1149   LYMPHSABS 1.1 10/21/2012 0844   MONOABS 0.4 10/21/2012 0844   EOSABS 0.1 10/21/2012 0844   BASOSABS 0.0 10/21/2012 0844    Hgb A1C No results found for: HGBA1C         Assessment & Plan:   Tick Bite with Cellulitis of Right Thigh:  eRx for Doxycycline 100 mg BID x 10 days Hydrocortisone as needed for itching  Return precautions discussed Webb Silversmith, NP

## 2018-01-07 ENCOUNTER — Emergency Department
Admission: EM | Admit: 2018-01-07 | Discharge: 2018-01-07 | Disposition: A | Discharge status: Home / Self Care | Payer: BLUE CROSS/BLUE SHIELD | Attending: Emergency Medicine | Admitting: Emergency Medicine

## 2018-01-07 ENCOUNTER — Other Ambulatory Visit: Payer: Self-pay

## 2018-01-07 ENCOUNTER — Encounter: Payer: Self-pay | Admitting: Emergency Medicine

## 2018-01-07 DIAGNOSIS — E871 Hypo-osmolality and hyponatremia: Secondary | ICD-10-CM | POA: Diagnosis not present

## 2018-01-07 DIAGNOSIS — J45909 Unspecified asthma, uncomplicated: Secondary | ICD-10-CM | POA: Insufficient documentation

## 2018-01-07 DIAGNOSIS — Z87891 Personal history of nicotine dependence: Secondary | ICD-10-CM | POA: Diagnosis not present

## 2018-01-07 DIAGNOSIS — F1010 Alcohol abuse, uncomplicated: Secondary | ICD-10-CM | POA: Insufficient documentation

## 2018-01-07 DIAGNOSIS — I10 Essential (primary) hypertension: Secondary | ICD-10-CM | POA: Insufficient documentation

## 2018-01-07 DIAGNOSIS — Z85820 Personal history of malignant melanoma of skin: Secondary | ICD-10-CM | POA: Diagnosis not present

## 2018-01-07 DIAGNOSIS — M542 Cervicalgia: Secondary | ICD-10-CM | POA: Diagnosis present

## 2018-01-07 DIAGNOSIS — F1020 Alcohol dependence, uncomplicated: Secondary | ICD-10-CM

## 2018-01-07 LAB — CBC WITH DIFFERENTIAL/PLATELET
BASOS PCT: 1 %
Basophils Absolute: 0.1 10*3/uL (ref 0–0.1)
EOS ABS: 0 10*3/uL (ref 0–0.7)
Eosinophils Relative: 0 %
HEMATOCRIT: 37.9 % (ref 35.0–47.0)
HEMOGLOBIN: 13.5 g/dL (ref 12.0–16.0)
LYMPHS ABS: 1.5 10*3/uL (ref 1.0–3.6)
Lymphocytes Relative: 25 %
MCH: 37.1 pg — AB (ref 26.0–34.0)
MCHC: 35.6 g/dL (ref 32.0–36.0)
MCV: 104.2 fL — ABNORMAL HIGH (ref 80.0–100.0)
Monocytes Absolute: 0.9 10*3/uL (ref 0.2–0.9)
Monocytes Relative: 15 %
NEUTROS ABS: 3.6 10*3/uL (ref 1.4–6.5)
NEUTROS PCT: 59 %
Platelets: 335 10*3/uL (ref 150–440)
RBC: 3.63 MIL/uL — AB (ref 3.80–5.20)
RDW: 14.6 % — ABNORMAL HIGH (ref 11.5–14.5)
WBC: 6 10*3/uL (ref 3.6–11.0)

## 2018-01-07 LAB — COMPREHENSIVE METABOLIC PANEL
ALBUMIN: 4.8 g/dL (ref 3.5–5.0)
ALT: 26 U/L (ref 0–44)
AST: 43 U/L — ABNORMAL HIGH (ref 15–41)
Alkaline Phosphatase: 77 U/L (ref 38–126)
Anion gap: 13 (ref 5–15)
BUN: 11 mg/dL (ref 8–23)
CALCIUM: 10.2 mg/dL (ref 8.9–10.3)
CO2: 22 mmol/L (ref 22–32)
CREATININE: 1.11 mg/dL — AB (ref 0.44–1.00)
Chloride: 90 mmol/L — ABNORMAL LOW (ref 98–111)
GFR calc Af Amer: 59 mL/min — ABNORMAL LOW (ref >60)
GFR calc non Af Amer: 51 mL/min — ABNORMAL LOW (ref >60)
GLUCOSE: 89 mg/dL (ref 70–99)
Potassium: 4.8 mmol/L (ref 3.5–5.1)
SODIUM: 125 mmol/L — AB (ref 135–145)
Total Bilirubin: 0.6 mg/dL (ref 0.3–1.2)
Total Protein: 8.5 g/dL — ABNORMAL HIGH (ref 6.5–8.1)

## 2018-01-07 LAB — URINALYSIS, COMPLETE (UACMP) WITH MICROSCOPIC
BACTERIA UA: NONE SEEN
Bilirubin Urine: NEGATIVE
Glucose, UA: NEGATIVE mg/dL
HGB URINE DIPSTICK: NEGATIVE
Ketones, ur: NEGATIVE mg/dL
Leukocytes, UA: NEGATIVE
Nitrite: NEGATIVE
PROTEIN: NEGATIVE mg/dL
Specific Gravity, Urine: 1.006 (ref 1.005–1.030)
pH: 5 (ref 5.0–8.0)

## 2018-01-07 LAB — TROPONIN I: Troponin I: <0.03 ng/mL (ref <0.03)

## 2018-01-07 MED ORDER — CHLORDIAZEPOXIDE HCL 25 MG PO CAPS: ORAL_CAPSULE | ORAL | 1 refills | Status: DC

## 2018-01-07 MED ORDER — SODIUM CHLORIDE 0.9 % IV SOLN
Freq: Once | INTRAVENOUS | Status: AC
  Administered 2018-01-07: 11:00:00 via INTRAVENOUS

## 2018-01-07 NOTE — ED Notes (Signed)
Pt states she was sent by her PCP due to abnormal electrolytes. Pt states she takes a fluid intermittently for LE edema and always takes potassium when she does. Denies any recent N/V/D/fever.

## 2018-01-07 NOTE — ED Provider Notes (Signed)
[facility] Emergency Department Provider Note       Time seen: ----------------------------------------- 9:56 AM on 01/07/2018 -----------------------------------------   I have reviewed the triage vital signs and the nursing notes.  HISTORY   Chief Complaint Sent by Dr/labs    HPI Diane Gomez is a 62 y.o. female with a history of anxiety, asthma, GERD, hypertension who presents to the ED for possible lab abnormalities.  She was called by her doctor's office this morning for lab work that was done yesterday.  She was told her sodium and potassium were abnormal.  Patient has numerous complaints but nonspecific to today.  She has had ongoing medical testing for the past year and describes a myriad of complaints including neck pain, back pain, weight loss, chronic diarrhea and a new inability to eat red meat.  Past Medical History:  Diagnosis Date  . Anxiety   . Asthma    from cats and horses only  . Cancer ([place]) 15 years ago   melanoma   . Fibroid    SUBMUCOUS MYOMA  . GERD (gastroesophageal reflux disease)   . Heart murmur   . Hypertension   . Osteoporosis 06/2017   T score -2.6  . Tumors    liver    Patient Active Problem List   Diagnosis Date Noted  . Anxiety 12/12/2016  . Allergy-induced asthma 12/12/2016  . Gastroesophageal reflux disease 06/27/2014  . History of nonmelanoma skin cancer 06/27/2014  . Hypertension   . Osteoporosis     Past Surgical History:  Procedure Laterality Date  . CESAREAN SECTION    . ENDOMETRIAL ABLATION  2009   HER OPTION  . HYSTEROSCOPY  2008   MYOMECTOMY  . TUBAL LIGATION      Allergies Penicillins and Other  Social History Social History   Tobacco Use  . Smoking status: Former Smoker    Types: Cigarettes  . Smokeless tobacco: Never Used  Substance Use Topics  . Alcohol use: Yes    Alcohol/week: 14.0 standard drinks    Types: 7 Shots of liquor, 7 Standard drinks or equivalent per week     Comment: moderate..."at least one drink per day" Vodka per pt  . Drug use: Yes    Frequency: 2.0 times per week    Types: Marijuana    Comment: occasional    Review of Systems Constitutional: Negative for fever. Cardiovascular: Negative for chest pain. Respiratory: Negative for shortness of breath. Gastrointestinal: Negative for abdominal pain, positive for chronic diarrhea Musculoskeletal: Positive for neck and back pain Skin: Negative for rash. Neurological: Negative for headaches, focal weakness or numbness.  All systems negative/normal/unremarkable except as stated in the HPI  ____________________________________________   PHYSICAL EXAM:  VITAL SIGNS: ED Triage Vitals  Enc Vitals Group     BP 01/07/18 0952 140/77     Pulse Rate 01/07/18 0951 69     Resp 01/07/18 0951 16     Temp 01/07/18 0951 98.2 F (36.8 C)     Temp Source 01/07/18 0951 Oral     SpO2 01/07/18 0951 99 %     Weight 01/07/18 0952 111 lb (50.3 kg)     Height 01/07/18 0952 5' 1"  (1.549 m)     Head Circumference --      Peak Flow --      Pain Score 01/07/18 0950 0     Pain Loc --      Pain Edu? --      Excl. in [place]? --  Constitutional: Alert and oriented. Well appearing and in no distress. Eyes: Conjunctivae are normal. Normal extraocular movements. ENT   Head: Normocephalic and atraumatic.   Nose: No congestion/rhinnorhea.   Mouth/Throat: Mucous membranes are moist.   Neck: No stridor. Cardiovascular: Normal rate, regular rhythm. No murmurs, rubs, or gallops. Respiratory: Normal respiratory effort without tachypnea nor retractions. Breath sounds are clear and equal bilaterally. No wheezes/rales/rhonchi. Gastrointestinal: Soft and nontender. Normal bowel sounds Musculoskeletal: Nontender with normal range of motion in extremities. No lower extremity tenderness nor edema. Neurologic:  Normal speech and language. No gross focal neurologic deficits are appreciated.  Skin:  Skin is  warm, dry and intact. No rash noted. Psychiatric: Elevated mood. ____________________________________________  EKG: Interpreted by me.  Sinus rhythm the rate is 66 bpm, normal PR interval, normal QRS, normal QT  ____________________________________________  ED COURSE:  As part of my medical decision making, I reviewed the following data within the [place] History obtained from family if available, nursing notes, old chart and ekg, as well as notes from prior ED visits. Patient presented for possible lab abnormalities, we will assess with labs and reevaluate.   Procedures ____________________________________________   LABS (pertinent positives/negatives)  Labs Reviewed  CBC WITH DIFFERENTIAL/PLATELET - Abnormal; Notable for the following components:      Result Value   RBC 3.63 (*)    MCV 104.2 (*)    MCH 37.1 (*)    RDW 14.6 (*)    All other components within normal limits  COMPREHENSIVE METABOLIC PANEL - Abnormal; Notable for the following components:   Sodium 125 (*)    Chloride 90 (*)    Creatinine, Ser 1.11 (*)    Total Protein 8.5 (*)    AST 43 (*)    GFR calc non Af Amer 51 (*)    GFR calc Af Amer 59 (*)    All other components within normal limits  URINALYSIS, COMPLETE (UACMP) WITH MICROSCOPIC - Abnormal; Notable for the following components:   Color, Urine YELLOW (*)    APPearance CLEAR (*)    All other components within normal limits  TROPONIN I   ____________________________________________  DIFFERENTIAL DIAGNOSIS   Lab error, dehydration, electrolyte abnormality, medication side effect, adrenal insufficiency  FINAL ASSESSMENT AND PLAN  Hyponatremia, alcohol use disorder   Plan: The patient had presented for concerns regarding recent lab work done. Patient's labs do indicate hyponatremia likely from chronic alcohol use.  Patient drinks alcohol daily and I will prescribe a Librium taper for her to try to detox at home.  Otherwise she  has received a liter of fluids, she is cleared for outpatient follow-up.   Laurence Aly, MD   Note: This note was generated in part or whole with voice recognition software. Voice recognition is usually quite accurate but there are transcription errors that can and very often do occur. I apologize for any typographical errors that were not detected and corrected.     Earleen Newport, MD 01/07/18 1136

## 2018-01-07 NOTE — ED Triage Notes (Signed)
Pt states Dr Mamie Nick office called this am for her to come to ED for labwork/electrolyte check. Na 122, K 5.8, blood drawn yesterday, pt appears in NAD.

## 2018-01-12 ENCOUNTER — Telehealth: Payer: Self-pay | Admitting: Internal Medicine

## 2018-01-12 DIAGNOSIS — Z91018 Allergy to other foods: Secondary | ICD-10-CM

## 2018-01-12 NOTE — Telephone Encounter (Signed)
Order placed

## 2018-01-12 NOTE — Addendum Note (Signed)
Addended by: Jearld Fenton on: 01/12/2018 11:52 AM   Modules accepted: Orders

## 2018-01-12 NOTE — Telephone Encounter (Signed)
If Avie Echevaria NP wants to order the test I checked with Terri in [facility] lab and we do the Alpha-Gal for allergies test and it would be sent to [facility].Please advise.

## 2018-01-12 NOTE — Telephone Encounter (Signed)
Copied from [place] [phone]. Topic: Appointment Scheduling - Scheduling Inquiry for Clinic >> Jan 12, 2018 11:05 AM Mylinda Latina, NT wrote: Reason for CRM: patient called and states that she would like to get a lab test re check done. She states that she had this test done at [facility]. The test she wants done is Alpha- Gal / For allergy ... Please call to schedule once order is place CB# [phone]

## 2018-01-13 ENCOUNTER — Telehealth: Payer: Self-pay | Admitting: Internal Medicine

## 2018-01-13 ENCOUNTER — Other Ambulatory Visit (INDEPENDENT_AMBULATORY_CARE_PROVIDER_SITE_OTHER): Payer: BLUE CROSS/BLUE SHIELD

## 2018-01-13 ENCOUNTER — Other Ambulatory Visit: Payer: BLUE CROSS/BLUE SHIELD

## 2018-01-13 DIAGNOSIS — Z91018 Allergy to other foods: Secondary | ICD-10-CM | POA: Diagnosis not present

## 2018-01-13 NOTE — Telephone Encounter (Signed)
Copied from [place] [phone]. Topic: General - Other >> Jan 13, 2018 12:03 PM Lennox Solders wrote: Reason for CRM: Pt is calling and would like to have her sodium level recheck. The pt went to [facility] on 01-06-18 and her sodium was low 122 and they gave her one bag of sodium. Pt is coming into today for lab work and would like to have sodium level recheck today

## 2018-01-13 NOTE — Telephone Encounter (Signed)
Not appropriate. She needs a OV for hospital followup and we can repeat sodium at that time.

## 2018-01-16 LAB — ALPHA-GAL PANEL
BEEF IGE: 0.12 kU/L (ref <0.35)
CLASS: 0
Class: 0
GALACTOSE-ALPHA-1, 3-GALACTOSE IGE: 0.18 kU/L — AB (ref <0.10)
LAMB/MUTTON IGE: <0.1 kU/L (ref <0.35)
Pork IgE: <0.1 kU/L (ref <0.35)

## 2018-01-16 NOTE — Telephone Encounter (Signed)
Pt stated she would have to call back to schedule f/u appt

## 2018-01-16 NOTE — Telephone Encounter (Signed)
Pt states she does not know when she can come in and will have to call back to schedule after she looks at her schedule for work

## 2018-01-20 ENCOUNTER — Encounter: Payer: Self-pay | Admitting: Internal Medicine

## 2018-01-20 ENCOUNTER — Ambulatory Visit: Payer: BLUE CROSS/BLUE SHIELD | Admitting: Internal Medicine

## 2018-01-20 VITALS — BP 124/80 | HR 82 | Temp 98.2°F | Wt 113.0 lb

## 2018-01-20 DIAGNOSIS — E871 Hypo-osmolality and hyponatremia: Secondary | ICD-10-CM | POA: Diagnosis not present

## 2018-01-20 DIAGNOSIS — F1010 Alcohol abuse, uncomplicated: Secondary | ICD-10-CM | POA: Diagnosis not present

## 2018-01-20 LAB — BASIC METABOLIC PANEL
BUN: 10 mg/dL (ref 6–23)
CALCIUM: 10.3 mg/dL (ref 8.4–10.5)
CHLORIDE: 97 meq/L (ref 96–112)
CO2: 27 mEq/L (ref 19–32)
CREATININE: 0.97 mg/dL (ref 0.40–1.20)
GFR: 61.43 mL/min (ref >60.00)
Glucose, Bld: 90 mg/dL (ref 70–99)
Potassium: 5 mEq/L (ref 3.5–5.1)
Sodium: 132 mEq/L — ABNORMAL LOW (ref 135–145)

## 2018-01-20 NOTE — Patient Instructions (Signed)
Hyponatremia Hyponatremia is when the amount of salt (sodium) in your blood is too low. When salt levels are low, your cells absorb extra water and they swell. The swelling happens throughout the body, but it mostly affects the brain. Follow these instructions at home:  Take medicines only as told by your doctor. Many medicines can make this condition worse. Talk with your doctor about any medicines that you are currently taking.  Carefully follow a recommended diet as told by your doctor.  Carefully follow instructions from your doctor about fluid restrictions.  Keep all follow-up visits as told by your doctor. This is important.  Do not drink alcohol. Contact a doctor if:  You feel sicker to your stomach (nauseous).  You feel more confused.  You feel more tired (fatigued).  Your headache gets worse.  You feel weaker.  Your symptoms go away and then they come back.  You have trouble following the diet instructions. Get help right away if:  You start to twitch and shake (have a seizure).  You pass out (faint).  You keep having watery poop (diarrhea).  You keep throwing up (vomiting). This information is not intended to replace advice given to you by your health care provider. Make sure you discuss any questions you have with your health care provider. Document Released: 01/23/2011 Document Revised: 10/19/2015 Document Reviewed: 05/09/2014 Elsevier Interactive Patient Education  2018 Elsevier Inc.  

## 2018-01-20 NOTE — Progress Notes (Signed)
Subjective:    Patient ID: Diane Gomez, female    DOB: 1956-03-18, 62 y.o.   MRN: [id number]  HPI  Pt presents to the clinic today for ER follow up. She went to the ER 8/14 for lab abnormalities, noted by her cardiologist. Sodium was 122, Potassium was 5.8. Repeat testing in the ER showed sodium of 125, potassium was normal. They felt like her low sodium was related to chronic alcohol use. She was given a liter of fluids, and prescribed a Librium taper for detox at home which she never picked up. Since discharge, she reports she quit drinking Vodka cold [place]. She has been supplementing with a beer every few days and Xanax as needed. She would like to have her sodium repeated today.  Review of Systems  Past Medical History:  Diagnosis Date  . Anxiety   . Asthma    from cats and horses only  . Cancer ([place]) 15 years ago   melanoma   . Fibroid    SUBMUCOUS MYOMA  . GERD (gastroesophageal reflux disease)   . Heart murmur   . Hypertension   . Osteoporosis 06/2017   T score -2.6  . Tumors    liver    Current Outpatient Medications  Medication Sig Dispense Refill  . bifidobacterium infantis (ALIGN) capsule Take 1 capsule daily by mouth.    . Calcium Carbonate-Vitamin D (CALCIUM + D PO) Take 2 tablets daily by mouth.     . chlordiazePOXIDE (LIBRIUM) 25 MG capsule Start by taking 5 capsules a day, then decrease by 1 tablet daily 15 capsule 1  . diltiazem (TIAZAC) 300 MG 24 hr capsule Take 300 mg by mouth daily.     Marland Kitchen doxycycline (VIBRA-TABS) 100 MG tablet Take 1 tablet (100 mg total) by mouth 2 (two) times daily. 20 tablet 0  . esomeprazole (NEXIUM) 40 MG capsule Take 20 mg by mouth daily before breakfast.     . hydrochlorothiazide (HYDRODIURIL) 25 MG tablet Take 1 tablet (25 mg total) by mouth daily as needed. 30 tablet 2  . lisinopril (PRINIVIL,ZESTRIL) 10 MG tablet TAKE 1 TABLET (10 MG TOTAL) BY MOUTH DAILY. 30 tablet 2  . metoprolol succinate (TOPROL-XL) 50 MG 24 hr tablet  Take 1 tablet by mouth daily.     . ondansetron (ZOFRAN) 4 MG tablet Take 1 tablet (4 mg total) by mouth 2 (two) times daily. Take 30 minutes before eating 40 tablet 2  . potassium chloride (K-DUR) 10 MEQ tablet Take 1 tablet (10 mEq total) by mouth daily. 30 tablet 2   Current Facility-Administered Medications  Medication Dose Route Frequency Provider Last Rate Last Dose  . 0.9 %  sodium chloride infusion  500 mL Intravenous Continuous Nandigam, Venia Minks, MD        Allergies  Allergen Reactions  . Penicillins Hives  . Other     Cats and horses    Family History  Problem Relation Age of Onset  . Hypertension Mother   . Parkinsonism Sister        PARKINSON'S DISEASE  . Breast cancer Other        Niece  Age 67  . Aneurysm Father   . Seizures Sister   . Heart disease Brother   . Colon cancer Neg Hx   . Stomach cancer Neg Hx   . Esophageal cancer Neg Hx     Social History   Socioeconomic History  . Marital status: Divorced    Spouse name: Not on  file  . Number of children: Not on file  . Years of education: Not on file  . Highest education level: Not on file  Occupational History  . Not on file  Social Needs  . Financial resource strain: Not on file  . Food insecurity:    Worry: Not on file    Inability: Not on file  . Transportation needs:    Medical: Not on file    Non-medical: Not on file  Tobacco Use  . Smoking status: Former Smoker    Types: Cigarettes  . Smokeless tobacco: Never Used  Substance and Sexual Activity  . Alcohol use: Yes    Alcohol/week: 14.0 standard drinks    Types: 7 Shots of liquor, 7 Standard drinks or equivalent per week    Comment: moderate..."at least one drink per day" Vodka per pt  . Drug use: Yes    Frequency: 2.0 times per week    Types: Marijuana    Comment: occasional   . Sexual activity: Not Currently    Birth control/protection: Post-menopausal, Surgical  Lifestyle  . Physical activity:    Days per week: Not on file     Minutes per session: Not on file  . Stress: Not on file  Relationships  . Social connections:    Talks on phone: Not on file    Gets together: Not on file    Attends religious service: Not on file    Active member of club or organization: Not on file    Attends meetings of clubs or organizations: Not on file    Relationship status: Not on file  . Intimate partner violence:    Fear of current or ex partner: Not on file    Emotionally abused: Not on file    Physically abused: Not on file    Forced sexual activity: Not on file  Other Topics Concern  . Not on file  Social History Narrative  . Not on file     Constitutional: Denies fever, malaise, fatigue, headache or abrupt weight changes.  Respiratory: Denies difficulty breathing, shortness of breath, cough or sputum production.   Cardiovascular: Denies chest pain, chest tightness, palpitations or swelling in the hands or feet.  Neurological: Denies dizziness, difficulty with memory, difficulty with speech or problems with balance and coordination.  Psych: Pt reports anxiety. Denies anxiety, depression, SI/HI.  No other specific complaints in a complete review of systems (except as listed in HPI above).     Objective:   Physical Exam  BP 124/80   Pulse 82   Temp 98.2 F (36.8 C) (Oral)   Wt 113 lb (51.3 kg)   SpO2 98%   BMI 21.35 kg/m  Wt Readings from Last 3 Encounters:  01/20/18 113 lb (51.3 kg)  01/07/18 111 lb (50.3 kg)  11/03/17 119 lb 4 oz (54.1 kg)    General: Appears her stated age, well developed, well nourished in NAD. Cardiovascular: Normal rate and rhythm. S1,S2 noted.  No murmur, rubs or gallops noted.  Pulmonary/Chest: Normal effort and positive vesicular breath sounds. No respiratory distress. No wheezes, rales or ronchi noted.  Neurological: Alert and oriented.   BMET    Component Value Date/Time   NA 125 (L) 01/07/2018 1015   NA 137 07/08/2013 1149   K 4.8 01/07/2018 1015   K 3.3 (L) 07/08/2013  1149   CL 90 (L) 01/07/2018 1015   CL 105 07/08/2013 1149   CO2 22 01/07/2018 1015   CO2 22 07/08/2013 1149  GLUCOSE 89 01/07/2018 1015   GLUCOSE 95 07/08/2013 1149   BUN 11 01/07/2018 1015   BUN 8 07/08/2013 1149   CREATININE 1.11 (H) 01/07/2018 1015   CREATININE 0.66 07/08/2013 1149   CALCIUM 10.2 01/07/2018 1015   CALCIUM 10.0 07/08/2013 1149   GFRNONAA 51 (L) 01/07/2018 1015   GFRNONAA >60 07/08/2013 1149   GFRAA 59 (L) 01/07/2018 1015   GFRAA >60 07/08/2013 1149    Lipid Panel     Component Value Date/Time   CHOL 285 (H) 02/18/2017 0908   TRIG 104.0 02/18/2017 0908   HDL 137.00 02/18/2017 0908   CHOLHDL 2 02/18/2017 0908   VLDL 20.8 02/18/2017 0908   LDLCALC 127 (H) 02/18/2017 0908    CBC    Component Value Date/Time   WBC 6.0 01/07/2018 1015   RBC 3.63 (L) 01/07/2018 1015   HGB 13.5 01/07/2018 1015   HGB 14.3 07/08/2013 1149   HCT 37.9 01/07/2018 1015   HCT 42.1 07/08/2013 1149   PLT 335 01/07/2018 1015   PLT 253 07/08/2013 1149   MCV 104.2 (H) 01/07/2018 1015   MCV 96 07/08/2013 1149   MCH 37.1 (H) 01/07/2018 1015   MCHC 35.6 01/07/2018 1015   RDW 14.6 (H) 01/07/2018 1015   RDW 14.3 07/08/2013 1149   LYMPHSABS 1.5 01/07/2018 1015   MONOABS 0.9 01/07/2018 1015   EOSABS 0.0 01/07/2018 1015   BASOSABS 0.1 01/07/2018 1015    Hgb A1C No results found for: HGBA1C          Assessment & Plan:   ER Follow Up for Hyponatremia, Alcohol Abuse:  Repeat BMET Congratulated her on stopping hard liquor, advised her no to supplement with beer or Xanax   Will follow up after lab results Webb Silversmith, NP

## 2018-01-28 ENCOUNTER — Other Ambulatory Visit: Payer: Self-pay | Admitting: Internal Medicine

## 2018-01-30 NOTE — Telephone Encounter (Signed)
HCTZ last filled 05/2017 with 2 refills... If pt is not taking, does she need to continue potassium now that her repeat labs were normal for potassium... Please advise

## 2018-02-01 NOTE — Telephone Encounter (Signed)
If she is not taking HCTZ, should not be taking potassium. Can we clarify before I refill?

## 2018-05-04 ENCOUNTER — Encounter: Payer: Self-pay | Admitting: Internal Medicine

## 2018-05-04 ENCOUNTER — Ambulatory Visit: Payer: BLUE CROSS/BLUE SHIELD | Admitting: Internal Medicine

## 2018-05-04 ENCOUNTER — Other Ambulatory Visit: Payer: Self-pay | Admitting: Internal Medicine

## 2018-05-04 VITALS — BP 132/84 | HR 71 | Temp 98.1°F | Wt 115.0 lb

## 2018-05-04 DIAGNOSIS — J4521 Mild intermittent asthma with (acute) exacerbation: Secondary | ICD-10-CM | POA: Diagnosis not present

## 2018-05-04 MED ORDER — PREDNISONE 10 MG PO TABS: ORAL_TABLET | ORAL | 0 refills | Status: DC

## 2018-05-04 MED ORDER — ALBUTEROL SULFATE HFA 108 (90 BASE) MCG/ACT IN AERS: 2.0000 | INHALATION_SPRAY | Freq: Four times a day (QID) | RESPIRATORY_TRACT | 0 refills | Status: DC | PRN

## 2018-05-04 NOTE — Patient Instructions (Signed)

## 2018-05-04 NOTE — Progress Notes (Signed)
HPI  Pt presents to the clinic today with c/o cough, chest congestion and wheezing. She reports this started 1 week ago. The cough is non productive. She denies runny nose, nasal congestion, ear pain, sore throat or shortness of breath. She reports fever up to 101.5, but denies chills or body aches. She has tried Coricidin and an old inhaler with minimal relief. She has a history of asthma. She has not had sick contacts.  Review of Systems      Past Medical History:  Diagnosis Date  . Anxiety   . Asthma    from cats and horses only  . Cancer ([place]) 15 years ago   melanoma   . Fibroid    SUBMUCOUS MYOMA  . GERD (gastroesophageal reflux disease)   . Heart murmur   . Hypertension   . Osteoporosis 06/2017   T score -2.6  . Tumors    liver    Family History  Problem Relation Age of Onset  . Hypertension Mother   . Parkinsonism Sister        PARKINSON'S DISEASE  . Breast cancer Other        Niece  Age 42  . Aneurysm Father   . Seizures Sister   . Heart disease Brother   . Colon cancer Neg Hx   . Stomach cancer Neg Hx   . Esophageal cancer Neg Hx     Social History   Socioeconomic History  . Marital status: Divorced    Spouse name: Not on file  . Number of children: Not on file  . Years of education: Not on file  . Highest education level: Not on file  Occupational History  . Not on file  Social Needs  . Financial resource strain: Not on file  . Food insecurity:    Worry: Not on file    Inability: Not on file  . Transportation needs:    Medical: Not on file    Non-medical: Not on file  Tobacco Use  . Smoking status: Former Smoker    Types: Cigarettes  . Smokeless tobacco: Never Used  Substance and Sexual Activity  . Alcohol use: Yes    Alcohol/week: 14.0 standard drinks    Types: 7 Shots of liquor, 7 Standard drinks or equivalent per week    Comment: moderate..."at least one drink per day" Vodka per pt  . Drug use: Yes    Frequency: 2.0 times per week   Types: Marijuana    Comment: occasional   . Sexual activity: Not Currently    Birth control/protection: Post-menopausal, Surgical  Lifestyle  . Physical activity:    Days per week: Not on file    Minutes per session: Not on file  . Stress: Not on file  Relationships  . Social connections:    Talks on phone: Not on file    Gets together: Not on file    Attends religious service: Not on file    Active member of club or organization: Not on file    Attends meetings of clubs or organizations: Not on file    Relationship status: Not on file  . Intimate partner violence:    Fear of current or ex partner: Not on file    Emotionally abused: Not on file    Physically abused: Not on file    Forced sexual activity: Not on file  Other Topics Concern  . Not on file  Social History Narrative  . Not on file    Allergies  Allergen Reactions  . Penicillins Hives  . Other     Cats and horses     Constitutional: Positive fever. Denies headache, fatigue or abrupt weight changes.  HEENT:  Denies eye redness, eye pain, pressure behind the eyes, facial pain, nasal congestion, ear pain, ringing in the ears, wax buildup, runny nose or sore throat. Respiratory: Positive cough, wheezing. Denies difficulty breathing or shortness of breath.  Cardiovascular: Denies chest pain, chest tightness, palpitations or swelling in the hands or feet.   No other specific complaints in a complete review of systems (except as listed in HPI above).  Objective:   BP 132/84   Pulse 71   Temp 98.1 F (36.7 C) (Oral)   Wt 115 lb (52.2 kg)   SpO2 97%   BMI 21.73 kg/m   Wt Readings from Last 3 Encounters:  05/04/18 115 lb (52.2 kg)  01/20/18 113 lb (51.3 kg)  01/07/18 111 lb (50.3 kg)     General: Appears her stated age, in NAD. HEENT: Head: normal shape and size, no sinus tenderness noted; Throat/Mouth:  Teeth present, mucosa pink and moist, no exudate noted, no lesions or ulcerations noted.  Neck: No  cervical lymphadenopathy.  Cardiovascular: Normal rate and rhythm. S1,S2 noted.  No murmur, rubs or gallops noted.  Pulmonary/Chest: Normal effort and positive vesicular breath sounds with bilateral expiratory wheezing noted. No respiratory distress. No rales or ronchi noted.       Assessment & Plan:   Asthma Exacerbation:  Get some rest and drink plenty of water RX for Pred Taper x 9 days RX for Albuterol inhaler as needed Delsym as needed for cough No indication for abx at this time  RTC as needed or if symptoms persist.   Webb Silversmith, NP

## 2018-05-29 ENCOUNTER — Other Ambulatory Visit: Payer: Self-pay | Admitting: Internal Medicine

## 2018-06-02 ENCOUNTER — Other Ambulatory Visit: Payer: Self-pay | Admitting: Internal Medicine

## 2018-06-03 NOTE — Telephone Encounter (Signed)
Last Rx and OV 05/04/2018. pls advise

## 2018-06-25 ENCOUNTER — Other Ambulatory Visit: Payer: Self-pay | Admitting: Internal Medicine

## 2018-07-20 ENCOUNTER — Other Ambulatory Visit: Payer: Self-pay | Admitting: Internal Medicine

## 2018-08-17 ENCOUNTER — Other Ambulatory Visit: Payer: Self-pay | Admitting: Internal Medicine

## 2018-09-11 ENCOUNTER — Other Ambulatory Visit: Payer: Self-pay | Admitting: Internal Medicine

## 2018-10-14 DIAGNOSIS — R001 Bradycardia, unspecified: Secondary | ICD-10-CM | POA: Insufficient documentation

## 2018-10-23 ENCOUNTER — Other Ambulatory Visit: Payer: Self-pay | Admitting: Internal Medicine

## 2018-11-16 ENCOUNTER — Other Ambulatory Visit: Payer: Self-pay | Admitting: Internal Medicine

## 2018-12-03 ENCOUNTER — Encounter: Payer: Self-pay | Admitting: Internal Medicine

## 2018-12-03 ENCOUNTER — Other Ambulatory Visit: Payer: Self-pay

## 2018-12-03 ENCOUNTER — Ambulatory Visit (INDEPENDENT_AMBULATORY_CARE_PROVIDER_SITE_OTHER): Payer: Medicare Other | Admitting: Internal Medicine

## 2018-12-03 VITALS — BP 100/64 | HR 74 | Temp 98.0°F | Ht 61.0 in | Wt 112.0 lb

## 2018-12-03 DIAGNOSIS — F419 Anxiety disorder, unspecified: Secondary | ICD-10-CM

## 2018-12-03 DIAGNOSIS — M810 Age-related osteoporosis without current pathological fracture: Secondary | ICD-10-CM | POA: Diagnosis not present

## 2018-12-03 DIAGNOSIS — K219 Gastro-esophageal reflux disease without esophagitis: Secondary | ICD-10-CM

## 2018-12-03 DIAGNOSIS — I10 Essential (primary) hypertension: Secondary | ICD-10-CM | POA: Diagnosis not present

## 2018-12-03 DIAGNOSIS — J4520 Mild intermittent asthma, uncomplicated: Secondary | ICD-10-CM

## 2018-12-03 LAB — COMPREHENSIVE METABOLIC PANEL
ALT: 21 U/L (ref 0–35)
AST: 31 U/L (ref 0–37)
Albumin: 4.5 g/dL (ref 3.5–5.2)
Alkaline Phosphatase: 61 U/L (ref 39–117)
BUN: 8 mg/dL (ref 6–23)
CO2: 27 mEq/L (ref 19–32)
Calcium: 10.2 mg/dL (ref 8.4–10.5)
Chloride: 98 mEq/L (ref 96–112)
Creatinine, Ser: 1.03 mg/dL (ref 0.40–1.20)
GFR: 53.78 mL/min — ABNORMAL LOW (ref >60.00)
Glucose, Bld: 89 mg/dL (ref 70–99)
Potassium: 5.3 mEq/L — ABNORMAL HIGH (ref 3.5–5.1)
Sodium: 134 mEq/L — ABNORMAL LOW (ref 135–145)
Total Bilirubin: 0.4 mg/dL (ref 0.2–1.2)
Total Protein: 7.1 g/dL (ref 6.0–8.3)

## 2018-12-03 LAB — CBC
HCT: 39.7 % (ref 36.0–46.0)
Hemoglobin: 13.5 g/dL (ref 12.0–15.0)
MCHC: 34 g/dL (ref 30.0–36.0)
MCV: 106.3 fl — ABNORMAL HIGH (ref 78.0–100.0)
Platelets: 345 10*3/uL (ref 150.0–400.0)
RBC: 3.73 Mil/uL — ABNORMAL LOW (ref 3.87–5.11)
RDW: 15.5 % (ref 11.5–15.5)
WBC: 5.2 10*3/uL (ref 4.0–10.5)

## 2018-12-03 LAB — LIPID PANEL
Cholesterol: 256 mg/dL — ABNORMAL HIGH (ref 0–200)
HDL: 94 mg/dL (ref >39.00)
NonHDL: 162.27
Total CHOL/HDL Ratio: 3
Triglycerides: 376 mg/dL — ABNORMAL HIGH (ref 0.0–149.0)
VLDL: 75.2 mg/dL — ABNORMAL HIGH (ref 0.0–40.0)

## 2018-12-03 LAB — VITAMIN D 25 HYDROXY (VIT D DEFICIENCY, FRACTURES): VITD: 45.81 ng/mL (ref 30.00–100.00)

## 2018-12-03 LAB — LDL CHOLESTEROL, DIRECT: Direct LDL: 109 mg/dL

## 2018-12-03 MED ORDER — MIRTAZAPINE 15 MG PO TABS: 7.5000 mg | ORAL_TABLET | Freq: Every day | ORAL | 1 refills | Status: DC

## 2018-12-06 NOTE — Assessment & Plan Note (Signed)
Support offered today Will monitor

## 2018-12-06 NOTE — Assessment & Plan Note (Signed)
Encouraged her to get 30 minutes of weight bearing exercise daily Advised her to start Calcium and Vit D OTC

## 2018-12-06 NOTE — Assessment & Plan Note (Signed)
Will stop Lisinopril Continue Diltiazem, Metoprolol and HCTZ Reinforced DASH diet

## 2018-12-06 NOTE — Patient Instructions (Signed)

## 2018-12-06 NOTE — Assessment & Plan Note (Signed)
Continue Albuterol prn 

## 2018-12-06 NOTE — Assessment & Plan Note (Signed)
Continue Esomeprazole CBC and CMET today 

## 2018-12-06 NOTE — Progress Notes (Signed)
Subjective:    Patient ID: Diane Gomez, female    DOB: December 19, 1955, 63 y.o.   MRN: [id number]  HPI  Pt due for follow up of chronic conditions.  Osteoporosis: She is not taking any Calcium or Vit D OTC. She does get 30 minutes of weight bearing exercise daily. Bone density from 06/2017 reviewed.  HTN: Her BP today is 100/64. She is taking Diltiazem, Metoprolol, Lisinopril and HCTZ as prescribed. She is taking a potassium supplement as well. She denies dizziness or syncopal episodes. ECG from 12/2017 reviewed.  GERD: She reports this is triggered by everything she eats. She denies breakthrough on Esomeprazole. Upper GI from 03/2017 reviewed.  Asthma: She denies chronic cough or SOB. She uses Albuterol as needed with good relief of symptoms. There are no PFT's on file.  Anxiety: Chronic but stable off meds. She denies depression. She is not currently seeing a therapist or psychiatrist.   Review of Systems      Past Medical History:  Diagnosis Date  . Anxiety   . Asthma    from cats and horses only  . Cancer ([place]) 15 years ago   melanoma   . Fibroid    SUBMUCOUS MYOMA  . GERD (gastroesophageal reflux disease)   . Heart murmur   . Hypertension   . Osteoporosis 06/2017   T score -2.6  . Tumors    liver    Current Outpatient Medications  Medication Sig Dispense Refill  . bifidobacterium infantis (ALIGN) capsule Take 1 capsule daily by mouth.    . diltiazem (TIAZAC) 300 MG 24 hr capsule Take 300 mg by mouth daily.     Marland Kitchen esomeprazole (NEXIUM) 40 MG capsule Take 20 mg by mouth daily before breakfast.     . hydrochlorothiazide (HYDRODIURIL) 25 MG tablet Take 1 tablet (25 mg total) by mouth daily as needed. 30 tablet 2  . lisinopril (ZESTRIL) 10 MG tablet TAKE 1 TABLET BY MOUTH DAILY **MUST SCHEDULE PHYSICAL EXAM** 30 tablet 0  . metoprolol succinate (TOPROL-XL) 50 MG 24 hr tablet Take 1 tablet by mouth daily.     . mirtazapine (REMERON) 15 MG tablet Take 0.5 tablets (7.5  mg total) by mouth at bedtime. 30 tablet 1  . potassium chloride (K-DUR) 10 MEQ tablet Take 1 tablet (10 mEq total) by mouth daily. MUST SCHEDULE PHYSICAL 30 tablet 0  . predniSONE (DELTASONE) 10 MG tablet Take 6 tabs day 1, 5 tabs day 2, 4 tabs day 3, 3 tabs day 4, 2 tabs day 5, 1 tab day 6 21 tablet 0  . VENTOLIN HFA 108 (90 Base) MCG/ACT inhaler INHALE 2 PUFFS INTO THE LUNGS EVERY 6 HOURS AS NEEDED FOR WHEEZING OR SHORTNESS OF BREATH 18 g 0   Current Facility-Administered Medications  Medication Dose Route Frequency Provider Last Rate Last Dose  . 0.9 %  sodium chloride infusion  500 mL Intravenous Continuous Nandigam, Venia Minks, MD        Allergies  Allergen Reactions  . Ondansetron     Other reaction(s): Other (See Comments) Bilateral eye swelling without breathing changes Bilateral eye swelling without breathing changes   . Penicillins Hives  . Brimonidine     Other reaction(s): Other (See Comments) Bit by a tick Bit by a tick   . Meat [Alpha-Gal]   . Other     Cats and horses  . Shellfish Allergy     Family History  Problem Relation Age of Onset  . Hypertension Mother   .  Parkinsonism Sister        PARKINSON'S DISEASE  . Breast cancer Other        Niece  Age 60  . Aneurysm Father   . Seizures Sister   . Heart disease Brother   . Colon cancer Neg Hx   . Stomach cancer Neg Hx   . Esophageal cancer Neg Hx     Social History   Socioeconomic History  . Marital status: Divorced    Spouse name: Not on file  . Number of children: Not on file  . Years of education: Not on file  . Highest education level: Not on file  Occupational History  . Not on file  Social Needs  . Financial resource strain: Not on file  . Food insecurity    Worry: Not on file    Inability: Not on file  . Transportation needs    Medical: Not on file    Non-medical: Not on file  Tobacco Use  . Smoking status: Former Smoker    Types: Cigarettes  . Smokeless tobacco: Never Used   Substance and Sexual Activity  . Alcohol use: Yes    Alcohol/week: 14.0 standard drinks    Types: 7 Shots of liquor, 7 Standard drinks or equivalent per week    Comment: moderate..."at least one drink per day" Vodka per pt  . Drug use: Yes    Frequency: 2.0 times per week    Types: Marijuana    Comment: occasional   . Sexual activity: Not Currently    Birth control/protection: Post-menopausal, Surgical  Lifestyle  . Physical activity    Days per week: Not on file    Minutes per session: Not on file  . Stress: Not on file  Relationships  . Social Herbalist on phone: Not on file    Gets together: Not on file    Attends religious service: Not on file    Active member of club or organization: Not on file    Attends meetings of clubs or organizations: Not on file    Relationship status: Not on file  . Intimate partner violence    Fear of current or ex partner: Not on file    Emotionally abused: Not on file    Physically abused: Not on file    Forced sexual activity: Not on file  Other Topics Concern  . Not on file  Social History Narrative  . Not on file     Constitutional: Pt reports weight loss. Denies fever, malaise, fatigue, headache.  HEENT: Denies eye pain, eye redness, ear pain, ringing in the ears, wax buildup, runny nose, nasal congestion, bloody nose, or sore throat. Respiratory: Denies difficulty breathing, shortness of breath, cough or sputum production.   Cardiovascular: Denies chest pain, chest tightness, palpitations or swelling in the hands or feet.  Gastrointestinal: Pt reports chronic diarrhea. Denies abdominal pain, bloating, constipation, or blood in the stool.  GU: Denies urgency, frequency, pain with urination, burning sensation, blood in urine, odor or discharge. Musculoskeletal: Denies decrease in range of motion, difficulty with gait, muscle pain or joint pain and swelling.  Skin: Denies redness, rashes, lesions or ulcercations.   Neurological: Denies dizziness, difficulty with memory, difficulty with speech or problems with balance and coordination.  Psych: Pt has a history of anxiety. Denies depression, SI/HI.  No other specific complaints in a complete review of systems (except as listed in HPI above).  Objective:   Physical Exam   BP 100/64 (BP  Location: Left Arm, Patient Position: Sitting, Cuff Size: Normal)   Pulse 74   Temp 98 F (36.7 C) (Temporal)   Ht 5\' 1"  (1.549 m)   Wt 112 lb (50.8 kg)   SpO2 98%   BMI 21.16 kg/m  Wt Readings from Last 3 Encounters:  12/03/18 112 lb (50.8 kg)  05/04/18 115 lb (52.2 kg)  01/20/18 113 lb (51.3 kg)    General: Appears her stated age, well developed, well nourished in NAD. Skin: Warm, dry and intact.  Cardiovascular: Normal rate and rhythm. S1,S2 noted.  No murmur, rubs or gallops noted. No JVD or BLE edema.  Pulmonary/Chest: Normal effort and positive vesicular breath sounds. No respiratory distress. No wheezes, rales or ronchi noted.  Abdomen: Soft and nontender. Normal bowel sounds. No distention or masses noted. Neurological: Alert and oriented. C Psychiatric: Mood and affect normal. Behavior is normal. Judgment and thought content normal.    BMET    Component Value Date/Time   NA 134 (L) 12/03/2018 1242   NA 137 07/08/2013 1149   K 5.3 (H) 12/03/2018 1242   K 3.3 (L) 07/08/2013 1149   CL 98 12/03/2018 1242   CL 105 07/08/2013 1149   CO2 27 12/03/2018 1242   CO2 22 07/08/2013 1149   GLUCOSE 89 12/03/2018 1242   GLUCOSE 95 07/08/2013 1149   BUN 8 12/03/2018 1242   BUN 8 07/08/2013 1149   CREATININE 1.03 12/03/2018 1242   CREATININE 0.66 07/08/2013 1149   CALCIUM 10.2 12/03/2018 1242   CALCIUM 10.0 07/08/2013 1149   GFRNONAA 51 (L) 01/07/2018 1015   GFRNONAA >60 07/08/2013 1149   GFRAA 59 (L) 01/07/2018 1015   GFRAA >60 07/08/2013 1149    Lipid Panel     Component Value Date/Time   CHOL 256 (H) 12/03/2018 1242   TRIG 376.0 (H)  12/03/2018 1242   HDL 94.00 12/03/2018 1242   CHOLHDL 3 12/03/2018 1242   VLDL 75.2 (H) 12/03/2018 1242   LDLCALC 127 (H) 02/18/2017 0908    CBC    Component Value Date/Time   WBC 5.2 12/03/2018 1242   RBC 3.73 (L) 12/03/2018 1242   HGB 13.5 12/03/2018 1242   HGB 14.3 07/08/2013 1149   HCT 39.7 12/03/2018 1242   HCT 42.1 07/08/2013 1149   PLT 345.0 12/03/2018 1242   PLT 253 07/08/2013 1149   MCV 106.3 (H) 12/03/2018 1242   MCV 96 07/08/2013 1149   MCH 37.1 (H) 01/07/2018 1015   MCHC 34.0 12/03/2018 1242   RDW 15.5 12/03/2018 1242   RDW 14.3 07/08/2013 1149   LYMPHSABS 1.5 01/07/2018 1015   MONOABS 0.9 01/07/2018 1015   EOSABS 0.0 01/07/2018 1015   BASOSABS 0.1 01/07/2018 1015    Hgb A1C No results found for: HGBA1C         Assessment & Plan:   Unintentional Weight Loss:  Will trial Mirtazapine  Make an appt for your annual exam Webb Silversmith, NP

## 2018-12-10 ENCOUNTER — Other Ambulatory Visit: Payer: Self-pay | Admitting: Internal Medicine

## 2018-12-21 ENCOUNTER — Other Ambulatory Visit: Payer: Self-pay | Admitting: Internal Medicine

## 2018-12-22 DIAGNOSIS — Z91018 Allergy to other foods: Secondary | ICD-10-CM | POA: Insufficient documentation

## 2019-02-22 ENCOUNTER — Telehealth: Payer: Self-pay | Admitting: Internal Medicine

## 2019-02-22 NOTE — Telephone Encounter (Signed)
She needs to go back and see gastroenterology for this.

## 2019-02-22 NOTE — Telephone Encounter (Signed)
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   Patient also stated that she would like test done to see if she can eat fish. She stated that she tried to eat flounder and had a flare up.    Please advise   C/B # [phone]

## 2019-02-25 NOTE — Telephone Encounter (Signed)
Patient returned your call.

## 2019-02-25 NOTE — Telephone Encounter (Signed)
Left message on voicemail.

## 2019-03-03 ENCOUNTER — Encounter: Payer: Self-pay | Admitting: Gynecology

## 2019-10-19 DIAGNOSIS — Z91018 Allergy to other foods: Secondary | ICD-10-CM | POA: Diagnosis not present

## 2019-10-19 DIAGNOSIS — I10 Essential (primary) hypertension: Secondary | ICD-10-CM | POA: Diagnosis not present

## 2019-10-19 DIAGNOSIS — R001 Bradycardia, unspecified: Secondary | ICD-10-CM | POA: Diagnosis not present

## 2019-10-19 DIAGNOSIS — Z0000 Encounter for general adult medical examination without abnormal findings: Secondary | ICD-10-CM | POA: Diagnosis not present

## 2019-10-27 ENCOUNTER — Telehealth: Payer: Self-pay | Admitting: *Deleted

## 2019-10-27 NOTE — Telephone Encounter (Signed)
Patient called requesting an appointment for asthma.  Patient was advised by the scheduler because of her symptoms we can not bring her into the office.  Patient was transferred to triage. Patient stated that she has asthma and needs an inhaler. Patient stated that she has had allergies since the pollen stated. Patient stated that she started with a cough a few days ago and if she is able to get anything up it is green in color. Patient stated that she does have some wheezing and the  inhaler that she has expired. Patient denies a fever or SOB. Advised patient unfortunately we can not bring her into the office with her symptoms. Offered patient to start out with a virtual visit, but patient declined stating that she wants to see a doctor and have them listen to her lungs. Offered patient information on urgent cares that have xray equipment in case she needs a chest xray. Patient stated that she knows where urgent cares are and she can go to one herself. Patient stated that she appreciated my time and hung the phone up.

## 2019-10-27 NOTE — Telephone Encounter (Signed)
Noted  

## 2019-10-27 NOTE — Telephone Encounter (Signed)
I spoke to pt and she stayed that if she can't be evaluated in person or have [facility] listen to her lungs, do not bother to call her back and she thinks it is ridiculous that we are not seeing sick patients in office and we need to open up like everyone else... Pt states she will just find another provider who will see her in office

## 2019-11-09 ENCOUNTER — Telehealth: Payer: Self-pay | Admitting: Internal Medicine

## 2019-11-09 ENCOUNTER — Encounter: Payer: Self-pay | Admitting: Medical Oncology

## 2019-11-09 ENCOUNTER — Other Ambulatory Visit: Payer: Self-pay

## 2019-11-09 ENCOUNTER — Emergency Department
Admission: EM | Admit: 2019-11-09 | Discharge: 2019-11-09 | Discharge status: Against Medical Advice | Payer: Medicare Other | Attending: Emergency Medicine | Admitting: Emergency Medicine

## 2019-11-09 DIAGNOSIS — E871 Hypo-osmolality and hyponatremia: Secondary | ICD-10-CM | POA: Diagnosis not present

## 2019-11-09 DIAGNOSIS — J209 Acute bronchitis, unspecified: Secondary | ICD-10-CM | POA: Diagnosis not present

## 2019-11-09 DIAGNOSIS — Z79899 Other long term (current) drug therapy: Secondary | ICD-10-CM | POA: Diagnosis not present

## 2019-11-09 DIAGNOSIS — I10 Essential (primary) hypertension: Secondary | ICD-10-CM | POA: Diagnosis not present

## 2019-11-09 DIAGNOSIS — R531 Weakness: Secondary | ICD-10-CM | POA: Diagnosis present

## 2019-11-09 DIAGNOSIS — B028 Zoster with other complications: Secondary | ICD-10-CM | POA: Diagnosis not present

## 2019-11-09 DIAGNOSIS — J45909 Unspecified asthma, uncomplicated: Secondary | ICD-10-CM | POA: Diagnosis not present

## 2019-11-09 DIAGNOSIS — Z87891 Personal history of nicotine dependence: Secondary | ICD-10-CM | POA: Diagnosis not present

## 2019-11-09 HISTORY — DX: Other disorders of plasma-protein metabolism, not elsewhere classified: E88.09

## 2019-11-09 HISTORY — DX: Lipid storage disorder, unspecified: E75.6

## 2019-11-09 LAB — URINALYSIS, COMPLETE (UACMP) WITH MICROSCOPIC
Bacteria, UA: NONE SEEN
Bilirubin Urine: NEGATIVE
Glucose, UA: NEGATIVE mg/dL
Hgb urine dipstick: NEGATIVE
Ketones, ur: NEGATIVE mg/dL
Nitrite: NEGATIVE
Protein, ur: NEGATIVE mg/dL
Specific Gravity, Urine: 1.008 (ref 1.005–1.030)
pH: 5 (ref 5.0–8.0)

## 2019-11-09 LAB — BASIC METABOLIC PANEL
Anion gap: 12 (ref 5–15)
BUN: 8 mg/dL (ref 8–23)
CO2: 24 mmol/L (ref 22–32)
Calcium: 10 mg/dL (ref 8.9–10.3)
Chloride: 88 mmol/L — ABNORMAL LOW (ref 98–111)
Creatinine, Ser: 1.05 mg/dL — ABNORMAL HIGH (ref 0.44–1.00)
GFR calc Af Amer: >60 mL/min (ref >60)
GFR calc non Af Amer: 56 mL/min — ABNORMAL LOW (ref >60)
Glucose, Bld: 105 mg/dL — ABNORMAL HIGH (ref 70–99)
Potassium: 4.8 mmol/L (ref 3.5–5.1)
Sodium: 124 mmol/L — ABNORMAL LOW (ref 135–145)

## 2019-11-09 LAB — CBC
HCT: 36.4 % (ref 36.0–46.0)
Hemoglobin: 13.3 g/dL (ref 12.0–15.0)
MCH: 33.6 pg (ref 26.0–34.0)
MCHC: 36.5 g/dL — ABNORMAL HIGH (ref 30.0–36.0)
MCV: 91.9 fL (ref 80.0–100.0)
Platelets: 290 10*3/uL (ref 150–400)
RBC: 3.96 MIL/uL (ref 3.87–5.11)
RDW: 12.9 % (ref 11.5–15.5)
WBC: 6.9 10*3/uL (ref 4.0–10.5)
nRBC: 0 % (ref 0.0–0.2)

## 2019-11-09 MED ORDER — SODIUM CHLORIDE 0.9% FLUSH: 3.0000 mL | Freq: Once | INTRAVENOUS | Status: DC

## 2019-11-09 MED ORDER — SODIUM CHLORIDE 0.9 % IV BOLUS
1000.0000 mL | Freq: Once | INTRAVENOUS | Status: AC
  Administered 2019-11-09: 1000 mL via INTRAVENOUS

## 2019-11-09 NOTE — ED Triage Notes (Signed)
Pt here with reports that she has recently was seen by dr Josefa Half and labs were drawn, Na was 122. Pt also reports that she has now began to have shingles to rt side of her back. Pt reports she has been feeling weak.

## 2019-11-09 NOTE — Discharge Instructions (Signed)
We recommended admission for your hyponatremia but you elected to want to leave [place].  You have received 1 L of fluids and hopefully this is helped your low sodium.  You should have a follow-up recheck in 2 days for your sodium level and if you decide that you would like further treatment or care you can return to the ER at any moment.

## 2019-11-09 NOTE — ED Provider Notes (Signed)
[facility] Emergency Department Provider Note  ____________________________________________   First MD Initiated Contact with Patient 11/09/19 1855     (approximate)  I have reviewed the triage vital signs and the nursing notes.   HISTORY  Chief Complaint Abnormal Lab and Herpes Zoster    HPI Diane Gomez is a 64 y.o. female with anxiety, hypertension who comes in with weakness.  Patient comes in for low sodium level.  Patient does report daily drinking of alcohol.  States that she has had low sodiums in the past on review of records it is been down to 125 but got better after some fluids and never required any other interventions and was sent home from ED.  Upon my arrival to the room patient is upset stating that she is been waiting for too long.  Patient's been in the ER for 3-1/2 hours.  Patient states that she does not want to stay any longer and she would like to leave.  Patient is already been given a prescription for her zoster on her flank.  Patient states that she is in a lot of pain.  Offered to give patient pain medication but she is stating that she does not want to stay any longer and would just like to go home.  I discussed patient her low sodium levels and how this could lead to the risk of seizures, death and that although she got some fluids I would like to recheck and make sure that it was doing better and potentially admit patient to the hospital for hyponatremia.  Patient stated that she is not interested in any interventions at this time.  Patient does not want to stay for further testing.  Patient has capacity make this decision.  Does not appear intoxicated.  Able to repeat back the risks including seizures and death.  Unable to get full HPI due to patient requesting to leave AMA          Past Medical History:  Diagnosis Date  . Alpha galactosidase deficiency   . Anxiety   . Asthma    from cats and horses only  . Cancer ([place]) 15  years ago   melanoma   . Fibroid    SUBMUCOUS MYOMA  . GERD (gastroesophageal reflux disease)   . Heart murmur   . Hypertension   . Osteoporosis 06/2017   T score -2.6  . Tumors    liver    Patient Active Problem List   Diagnosis Date Noted  . Anxiety 12/12/2016  . Allergy-induced asthma 12/12/2016  . Gastroesophageal reflux disease 06/27/2014  . Hypertension   . Osteoporosis     Past Surgical History:  Procedure Laterality Date  . CESAREAN SECTION    . ENDOMETRIAL ABLATION  2009   HER OPTION  . HYSTEROSCOPY  2008   MYOMECTOMY  . TUBAL LIGATION      Prior to Admission medications   Medication Sig Start Date End Date Taking? Authorizing Provider  bifidobacterium infantis (ALIGN) capsule Take 1 capsule daily by mouth.    [provider]  diltiazem (TIAZAC) 300 MG 24 hr capsule Take 300 mg by mouth daily.     [provider]  esomeprazole (NEXIUM) 40 MG capsule Take 20 mg by mouth daily before breakfast.     [provider]  hydrochlorothiazide (HYDRODIURIL) 25 MG tablet Take 1 tablet (25 mg total) by mouth daily as needed. 06/11/17   Jearld Fenton, NP  lisinopril (ZESTRIL) 10 MG tablet  TAKE 1 TABLET BY MOUTH DAILY **MUST SCHEDULE PHYSICAL EXAM** 11/17/18   Jearld Fenton, NP  metoprolol succinate (TOPROL-XL) 50 MG 24 hr tablet Take 1 tablet by mouth daily.  04/18/14   [provider]  mirtazapine (REMERON) 15 MG tablet Take 0.5 tablets (7.5 mg total) by mouth at bedtime. 12/03/18   Jearld Fenton, NP  predniSONE (DELTASONE) 10 MG tablet Take 6 tabs day 1, 5 tabs day 2, 4 tabs day 3, 3 tabs day 4, 2 tabs day 5, 1 tab day 6 05/04/18   Jearld Fenton, NP  VENTOLIN HFA 108 (90 Base) MCG/ACT inhaler INHALE 2 PUFFS INTO THE LUNGS EVERY 6 HOURS AS NEEDED FOR WHEEZING OR SHORTNESS OF BREATH 06/03/18   Jearld Fenton, NP    Allergies Ondansetron, Penicillins, Brimonidine, Meat [alpha-gal], Other, and Shellfish allergy  Family History  Problem  Relation Age of Onset  . Hypertension Mother   . Parkinsonism Sister        PARKINSON'S DISEASE  . Breast cancer Other        Niece  Age 89  . Aneurysm Father   . Seizures Sister   . Heart disease Brother   . Colon cancer Neg Hx   . Stomach cancer Neg Hx   . Esophageal cancer Neg Hx     Social History Social History   Tobacco Use  . Smoking status: Former Smoker    Types: Cigarettes  . Smokeless tobacco: Never Used  Vaping Use  . Vaping Use: Never used  Substance Use Topics  . Alcohol use: Yes    Alcohol/week: 14.0 standard drinks    Types: 7 Shots of liquor, 7 Standard drinks or equivalent per week    Comment: moderate..."at least one drink per day" Vodka per pt  . Drug use: Yes    Frequency: 2.0 times per week    Types: Marijuana    Comment: occasional       Review of Systems Unable to get full review of systems due to patient requesting to leave AMA ____________________________________________   PHYSICAL EXAM:  VITAL SIGNS: ED Triage Vitals  Enc Vitals Group     BP 11/09/19 1626 (!) 164/101     Pulse Rate 11/09/19 1626 97     Resp 11/09/19 1626 17     Temp 11/09/19 1626 98.6 F (37 C)     Temp Source 11/09/19 1626 Oral     SpO2 11/09/19 1626 100 %     Weight 11/09/19 1627 111 lb (50.3 kg)     Height 11/09/19 1627 5\' 1"  (1.549 m)     Head Circumference --      Peak Flow --      Pain Score 11/09/19 1641 10     Pain Loc --      Pain Edu? --      Excl. in [place]? --     Constitutional: Alert and oriented. Well appearing and in no acute distress. Eyes: Conjunctivae are normal. EOMI. Head: Atraumatic. Nose: No congestion/rhinnorhea. Mouth/Throat: Mucous membranes are moist.   Neck: No stridor. Trachea Midline. FROM Cardiovascular: Normal rate, regular rhythm. Grossly normal heart sounds.  Good peripheral circulation. Respiratory: Normal respiratory effort.  No retractions. Lungs CTAB. Gastrointestinal: Soft and nontender. No distention. No abdominal  bruits.  Musculoskeletal: No lower extremity tenderness nor edema.  No joint effusions. Neurologic:  Normal speech and language. No gross focal neurologic deficits are appreciated.  Skin:  Skin is warm, dry and intact.  Zoster  rash noted to the flank does not cross midline Psychiatric: Mood and affect are normal. Speech and behavior are normal. GU: Deferred   ____________________________________________   LABS (all labs ordered are listed, but only abnormal results are displayed)  Labs Reviewed  BASIC METABOLIC PANEL - Abnormal; Notable for the following components:      Result Value   Sodium 124 (*)    Chloride 88 (*)    Glucose, Bld 105 (*)    Creatinine, Ser 1.05 (*)    GFR calc non Af Amer 56 (*)    All other components within normal limits  CBC - Abnormal; Notable for the following components:   MCHC 36.5 (*)    All other components within normal limits  URINALYSIS, COMPLETE (UACMP) WITH MICROSCOPIC - Abnormal; Notable for the following components:   Color, Urine YELLOW (*)    APPearance CLEAR (*)    Leukocytes,Ua TRACE (*)    All other components within normal limits   ____________________________________________   ED ECG REPORT I, Diane Gomez, the attending physician, personally viewed and interpreted this ECG.  Unable to obtain EKG given patient requesting to leave AMA ____________________________________________   INITIAL IMPRESSION / ASSESSMENT AND PLAN / ED COURSE  EPIPHANY SELTZER was evaluated in Emergency Department on 11/09/2019 for the symptoms described in the history of present illness. She was evaluated in the context of the global COVID-19 pandemic, which necessitated consideration that the patient might be at risk for infection with the SARS-CoV-2 virus that causes COVID-19. Institutional protocols and algorithms that pertain to the evaluation of patients at risk for COVID-19 are in a state of rapid change based on information released by regulatory  bodies including the CDC and federal and state organizations. These policies and algorithms were followed during the patient's care in the ED.     Patient is a 64 year old female who comes in with concerns for low sodium.  Patient is already been given 1 L of fluid.  Labs are rechecked prior to the fluid to confirm a low sodium, low evaluate for AKI, electrolyte abnormalities.  Was going to add on mag, phos, thyroid testing, and testing to evaluate potential causes for her low sodium although patient is requesting to leave [place].  Sodium is 124 down from 134.  Chloride is 88 down from 98.  Kidney function is elevated at 1.05 Potassium normal at 4.8 No evidence of anemia. No white count elevation   Patient course to leave [place], she has capacity make this decision, she is able to repeat back the risks to me.  She already has a prescription for her herpes zoster is noted on the back.  I tried to compromise the patient and offer give her pain medicine to encourage patient to stay but she states that she just wants to get in her own bed and does not want to stay any longer in the emergency room     ____________________________________________   FINAL CLINICAL IMPRESSION(S) / ED DIAGNOSES   Final diagnoses:  Hyponatremia      MEDICATIONS GIVEN DURING THIS VISIT:  Medications  sodium chloride 0.9 % bolus 1,000 mL (0 mLs Intravenous Stopped 11/09/19 1815)     ED Discharge Orders    None       Note:  This document was prepared using Dragon voice recognition software and may include unintentional dictation errors.   Diane Chilton, MD 11/10/19 1459

## 2019-11-09 NOTE — ED Notes (Signed)
This RN and MD Jari Pigg entered room to assess patient. Pt visibly upset when we entered room. Pt stating she was to go home at this time and does not wish to wait. MD Jari Pigg offering admission and pain control due to patient's shingles and low sodium. Pt states "I'll just go home and take tylenol, I can't sit here and wait and shiver any longer". MD Jari Pigg explained the risks of leaving with this RN at bedside. Pt refusing to stay at this time. Pt instructed she can return at any time and that it is very important to get her sodium level rechecked in two days. Pt verbalized understanding and AMA papers signed. This RN removed patient's IV and pt ambulated to lobby at time of departure. Pt appeared to be in NAD at time of departure.

## 2019-11-09 NOTE — Telephone Encounter (Signed)
Patient called today  She stated that [facility] will be faxing over results from the labs she had done with there office. Patient wanted to make sure you were aware so you could keep an eye out for them

## 2019-11-15 DIAGNOSIS — B028 Zoster with other complications: Secondary | ICD-10-CM | POA: Diagnosis not present

## 2019-11-15 DIAGNOSIS — R5381 Other malaise: Secondary | ICD-10-CM | POA: Diagnosis not present

## 2019-11-15 DIAGNOSIS — R5383 Other fatigue: Secondary | ICD-10-CM | POA: Diagnosis not present

## 2019-11-15 DIAGNOSIS — Z8709 Personal history of other diseases of the respiratory system: Secondary | ICD-10-CM | POA: Diagnosis not present

## 2019-11-15 DIAGNOSIS — E871 Hypo-osmolality and hyponatremia: Secondary | ICD-10-CM | POA: Diagnosis not present

## 2019-11-16 DIAGNOSIS — J45909 Unspecified asthma, uncomplicated: Secondary | ICD-10-CM | POA: Diagnosis not present

## 2020-02-11 DIAGNOSIS — R6889 Other general symptoms and signs: Secondary | ICD-10-CM | POA: Diagnosis not present

## 2020-04-04 ENCOUNTER — Other Ambulatory Visit: Payer: Self-pay

## 2020-04-04 ENCOUNTER — Encounter: Payer: Self-pay | Admitting: Internal Medicine

## 2020-04-04 ENCOUNTER — Ambulatory Visit (INDEPENDENT_AMBULATORY_CARE_PROVIDER_SITE_OTHER): Payer: Medicare Other | Admitting: Internal Medicine

## 2020-04-04 VITALS — BP 136/70 | HR 71 | Temp 97.8°F | Ht 61.0 in | Wt 118.0 lb

## 2020-04-04 DIAGNOSIS — F1010 Alcohol abuse, uncomplicated: Secondary | ICD-10-CM | POA: Diagnosis not present

## 2020-04-04 DIAGNOSIS — N3946 Mixed incontinence: Secondary | ICD-10-CM | POA: Diagnosis not present

## 2020-04-04 MED ORDER — OXYBUTYNIN CHLORIDE ER 5 MG PO TB24
5.0000 mg | ORAL_TABLET | Freq: Every day | ORAL | 5 refills | Status: DC
Start: 2020-04-04 — End: 2020-10-16

## 2020-04-04 NOTE — Assessment & Plan Note (Signed)
Encouraged cessation.

## 2020-04-04 NOTE — Assessment & Plan Note (Signed)
Avoid fluids after 7pm Cut back on ETOH use Will trial Oxybutynin 5 mg XL daily Consider urology referral if symptoms persist or worsen

## 2020-04-04 NOTE — Patient Instructions (Signed)

## 2020-04-04 NOTE — Progress Notes (Signed)
Subjective:    Patient ID: Diane Gomez, female    DOB: May 07, 1956, 64 y.o.   MRN: [id number]  HPI  Pt presents to the clinic today with c/o urinary incontinence. She reports urinary urgency, frequency but denies burning sensation, pain with urination or blood in her urine. She reports she also leaks urine when she sneezes or coughs. She reports all she drinks is beer and she has no intention to give this up. She has not taken any medication OTC for this.  Review of Systems  Past Medical History:  Diagnosis Date  . Alpha galactosidase deficiency   . Anxiety   . Asthma    from cats and horses only  . Cancer ([place]) 15 years ago   melanoma   . Fibroid    SUBMUCOUS MYOMA  . GERD (gastroesophageal reflux disease)   . Heart murmur   . Hypertension   . Osteoporosis 06/2017   T score -2.6  . Tumors    liver    Current Outpatient Medications  Medication Sig Dispense Refill  . diltiazem (TIAZAC) 300 MG 24 hr capsule Take 180 mg by mouth daily.     Marland Kitchen esomeprazole (NEXIUM) 40 MG capsule Take 20 mg by mouth daily before breakfast.     . metoprolol succinate (TOPROL-XL) 50 MG 24 hr tablet Take 1 tablet by mouth daily.     . VENTOLIN HFA 108 (90 Base) MCG/ACT inhaler INHALE 2 PUFFS INTO THE LUNGS EVERY 6 HOURS AS NEEDED FOR WHEEZING OR SHORTNESS OF BREATH 18 g 0  . bifidobacterium infantis (ALIGN) capsule Take 1 capsule daily by mouth. (Patient not taking: Reported on 04/04/2020)    . hydrochlorothiazide (HYDRODIURIL) 25 MG tablet Take 1 tablet (25 mg total) by mouth daily as needed. (Patient not taking: Reported on 04/04/2020) 30 tablet 2  . lisinopril (ZESTRIL) 10 MG tablet TAKE 1 TABLET BY MOUTH DAILY **MUST SCHEDULE PHYSICAL EXAM** (Patient not taking: Reported on 04/04/2020) 30 tablet 0  . mirtazapine (REMERON) 15 MG tablet Take 0.5 tablets (7.5 mg total) by mouth at bedtime. (Patient not taking: Reported on 04/04/2020) 30 tablet 1  . predniSONE (DELTASONE) 10 MG tablet Take 6 tabs day  1, 5 tabs day 2, 4 tabs day 3, 3 tabs day 4, 2 tabs day 5, 1 tab day 6 (Patient not taking: Reported on 04/04/2020) 21 tablet 0   No current facility-administered medications for this visit.    Allergies  Allergen Reactions  . Ondansetron     Other reaction(s): Other (See Comments) Bilateral eye swelling without breathing changes Bilateral eye swelling without breathing changes   . Penicillins Hives  . Brimonidine     Other reaction(s): Other (See Comments) Bit by a tick Bit by a tick   . Meat [Alpha-Gal]   . Other     Cats and horses  . Shellfish Allergy     Family History  Problem Relation Age of Onset  . Hypertension Mother   . Parkinsonism Sister        PARKINSON'S DISEASE  . Breast cancer Other        Niece  Age 11  . Aneurysm Father   . Seizures Sister   . Heart disease Brother   . Colon cancer Neg Hx   . Stomach cancer Neg Hx   . Esophageal cancer Neg Hx     Social History   Socioeconomic History  . Marital status: Divorced    Spouse name: Not on file  . Number  of children: Not on file  . Years of education: Not on file  . Highest education level: Not on file  Occupational History  . Not on file  Tobacco Use  . Smoking status: Former Smoker    Types: Cigarettes  . Smokeless tobacco: Never Used  Vaping Use  . Vaping Use: Never used  Substance and Sexual Activity  . Alcohol use: Yes    Alcohol/week: 14.0 standard drinks    Types: 7 Shots of liquor, 7 Standard drinks or equivalent per week    Comment: moderate..."at least one drink per day" Vodka per pt  . Drug use: Yes    Frequency: 2.0 times per week    Types: Marijuana    Comment: occasional   . Sexual activity: Not Currently    Birth control/protection: Post-menopausal, Surgical  Other Topics Concern  . Not on file  Social History Narrative  . Not on file   Social Determinants of Health   Financial Resource Strain:   . Difficulty of Paying Living Expenses: Not on file  Food Insecurity:    . Worried About Charity fundraiser in the Last Year: Not on file  . Ran Out of Food in the Last Year: Not on file  Transportation Needs:   . Lack of Transportation (Medical): Not on file  . Lack of Transportation (Non-Medical): Not on file  Physical Activity:   . Days of Exercise per Week: Not on file  . Minutes of Exercise per Session: Not on file  Stress:   . Feeling of Stress : Not on file  Social Connections:   . Frequency of Communication with Friends and Family: Not on file  . Frequency of Social Gatherings with Friends and Family: Not on file  . Attends Religious Services: Not on file  . Active Member of Clubs or Organizations: Not on file  . Attends Archivist Meetings: Not on file  . Marital Status: Not on file  Intimate Partner Violence:   . Fear of Current or Ex-Partner: Not on file  . Emotionally Abused: Not on file  . Physically Abused: Not on file  . Sexually Abused: Not on file     Constitutional: Denies fever, malaise, fatigue, headache or abrupt weight changes.  Respiratory: Denies difficulty breathing, shortness of breath, cough or sputum production.   Cardiovascular: Denies chest pain, chest tightness, palpitations or swelling in the hands or feet.  Gastrointestinal: Denies abdominal pain, bloating, constipation, diarrhea or blood in the stool.  GU: Pt reports urinary incontinence. Denies urgency, frequency, pain with urination, burning sensation, blood in urine, odor or discharge.  No other specific complaints in a complete review of systems (except as listed in HPI above).     Objective:   Physical Exam   BP 136/70 (BP Location: Left Arm, Patient Position: Sitting, Cuff Size: Normal)   Pulse 71   Temp 97.8 F (36.6 C) (Temporal)   Ht 5\' 1"  (1.549 m)   Wt 118 lb (53.5 kg)   SpO2 99%   BMI 22.30 kg/m  Wt Readings from Last 3 Encounters:  04/04/20 118 lb (53.5 kg)  11/09/19 111 lb (50.3 kg)  12/03/18 112 lb (50.8 kg)    General:  Appears her stated age, well developed, well nourished in NAD. Cardiovascular: Normal rate and rhythm. Pulmonary/Chest: Normal effort and positive vesicular breath sounds.   Abdomen: Soft and nontender. Neurological: Alert and oriented.    BMET    Component Value Date/Time   NA 124 (L)  11/09/2019 1701   NA 137 07/08/2013 1149   K 4.8 11/09/2019 1701   K 3.3 (L) 07/08/2013 1149   CL 88 (L) 11/09/2019 1701   CL 105 07/08/2013 1149   CO2 24 11/09/2019 1701   CO2 22 07/08/2013 1149   GLUCOSE 105 (H) 11/09/2019 1701   GLUCOSE 95 07/08/2013 1149   BUN 8 11/09/2019 1701   BUN 8 07/08/2013 1149   CREATININE 1.05 (H) 11/09/2019 1701   CREATININE 0.66 07/08/2013 1149   CALCIUM 10.0 11/09/2019 1701   CALCIUM 10.0 07/08/2013 1149   GFRNONAA 56 (L) 11/09/2019 1701   GFRNONAA >60 07/08/2013 1149   GFRAA >60 11/09/2019 1701   GFRAA >60 07/08/2013 1149    Lipid Panel     Component Value Date/Time   CHOL 256 (H) 12/03/2018 1242   TRIG 376.0 (H) 12/03/2018 1242   HDL 94.00 12/03/2018 1242   CHOLHDL 3 12/03/2018 1242   VLDL 75.2 (H) 12/03/2018 1242   LDLCALC 127 (H) 02/18/2017 0908    CBC    Component Value Date/Time   WBC 6.9 11/09/2019 1701   RBC 3.96 11/09/2019 1701   HGB 13.3 11/09/2019 1701   HGB 14.3 07/08/2013 1149   HCT 36.4 11/09/2019 1701   HCT 42.1 07/08/2013 1149   PLT 290 11/09/2019 1701   PLT 253 07/08/2013 1149   MCV 91.9 11/09/2019 1701   MCV 96 07/08/2013 1149   MCH 33.6 11/09/2019 1701   MCHC 36.5 (H) 11/09/2019 1701   RDW 12.9 11/09/2019 1701   RDW 14.3 07/08/2013 1149   LYMPHSABS 1.5 01/07/2018 1015   MONOABS 0.9 01/07/2018 1015   EOSABS 0.0 01/07/2018 1015   BASOSABS 0.1 01/07/2018 1015    Hgb A1C No results found for: HGBA1C         Assessment & Plan:

## 2020-04-17 DIAGNOSIS — I10 Essential (primary) hypertension: Secondary | ICD-10-CM | POA: Diagnosis not present

## 2020-04-17 DIAGNOSIS — Z79899 Other long term (current) drug therapy: Secondary | ICD-10-CM | POA: Diagnosis not present

## 2020-05-02 DIAGNOSIS — Z79899 Other long term (current) drug therapy: Secondary | ICD-10-CM | POA: Diagnosis not present

## 2020-05-07 ENCOUNTER — Emergency Department: Payer: Medicare Other

## 2020-05-07 ENCOUNTER — Other Ambulatory Visit: Payer: Self-pay

## 2020-05-07 ENCOUNTER — Emergency Department
Admission: EM | Admit: 2020-05-07 | Discharge: 2020-05-07 | Disposition: A | Discharge status: Home / Self Care | Payer: Medicare Other | Attending: Emergency Medicine | Admitting: Emergency Medicine

## 2020-05-07 ENCOUNTER — Encounter: Payer: Self-pay | Admitting: Emergency Medicine

## 2020-05-07 DIAGNOSIS — I10 Essential (primary) hypertension: Secondary | ICD-10-CM | POA: Insufficient documentation

## 2020-05-07 DIAGNOSIS — Z79899 Other long term (current) drug therapy: Secondary | ICD-10-CM | POA: Insufficient documentation

## 2020-05-07 DIAGNOSIS — R42 Dizziness and giddiness: Secondary | ICD-10-CM | POA: Diagnosis not present

## 2020-05-07 DIAGNOSIS — Z85820 Personal history of malignant melanoma of skin: Secondary | ICD-10-CM | POA: Diagnosis not present

## 2020-05-07 DIAGNOSIS — J45909 Unspecified asthma, uncomplicated: Secondary | ICD-10-CM | POA: Insufficient documentation

## 2020-05-07 DIAGNOSIS — Z87891 Personal history of nicotine dependence: Secondary | ICD-10-CM | POA: Insufficient documentation

## 2020-05-07 DIAGNOSIS — E871 Hypo-osmolality and hyponatremia: Secondary | ICD-10-CM | POA: Insufficient documentation

## 2020-05-07 DIAGNOSIS — R519 Headache, unspecified: Secondary | ICD-10-CM | POA: Diagnosis not present

## 2020-05-07 DIAGNOSIS — I160 Hypertensive urgency: Secondary | ICD-10-CM | POA: Diagnosis not present

## 2020-05-07 DIAGNOSIS — R002 Palpitations: Secondary | ICD-10-CM | POA: Diagnosis not present

## 2020-05-07 LAB — COMPREHENSIVE METABOLIC PANEL
ALT: 16 U/L (ref 0–44)
AST: 24 U/L (ref 15–41)
Albumin: 4.7 g/dL (ref 3.5–5.0)
Alkaline Phosphatase: 74 U/L (ref 38–126)
Anion gap: 12 (ref 5–15)
BUN: 12 mg/dL (ref 8–23)
CO2: 23 mmol/L (ref 22–32)
Calcium: 10.3 mg/dL (ref 8.9–10.3)
Chloride: 94 mmol/L — ABNORMAL LOW (ref 98–111)
Creatinine, Ser: 1.14 mg/dL — ABNORMAL HIGH (ref 0.44–1.00)
GFR, Estimated: 53 mL/min — ABNORMAL LOW (ref >60)
Glucose, Bld: 215 mg/dL — ABNORMAL HIGH (ref 70–99)
Potassium: 4.3 mmol/L (ref 3.5–5.1)
Sodium: 129 mmol/L — ABNORMAL LOW (ref 135–145)
Total Bilirubin: 0.8 mg/dL (ref 0.3–1.2)
Total Protein: 8.5 g/dL — ABNORMAL HIGH (ref 6.5–8.1)

## 2020-05-07 LAB — CBC
HCT: 39.9 % (ref 36.0–46.0)
Hemoglobin: 14.2 g/dL (ref 12.0–15.0)
MCH: 32.5 pg (ref 26.0–34.0)
MCHC: 35.6 g/dL (ref 30.0–36.0)
MCV: 91.3 fL (ref 80.0–100.0)
Platelets: 337 10*3/uL (ref 150–400)
RBC: 4.37 MIL/uL (ref 3.87–5.11)
RDW: 12.1 % (ref 11.5–15.5)
WBC: 6.8 10*3/uL (ref 4.0–10.5)
nRBC: 0 % (ref 0.0–0.2)

## 2020-05-07 LAB — MAGNESIUM: Magnesium: 2.1 mg/dL (ref 1.7–2.4)

## 2020-05-07 LAB — TROPONIN I (HIGH SENSITIVITY): Troponin I (High Sensitivity): 4 ng/L (ref <18)

## 2020-05-07 MED ORDER — DIPHENHYDRAMINE HCL 25 MG PO CAPS
25.0000 mg | ORAL_CAPSULE | Freq: Once | ORAL | Status: AC
  Administered 2020-05-07: 25 mg via ORAL
  Filled 2020-05-07: qty 1

## 2020-05-07 MED ORDER — PROCHLORPERAZINE EDISYLATE 10 MG/2ML IJ SOLN
10.0000 mg | Freq: Once | INTRAMUSCULAR | Status: AC
  Administered 2020-05-07: 10 mg via INTRAVENOUS
  Filled 2020-05-07: qty 2

## 2020-05-07 MED ORDER — LACTATED RINGERS IV BOLUS
1000.0000 mL | Freq: Once | INTRAVENOUS | Status: AC
  Administered 2020-05-07: 1000 mL via INTRAVENOUS

## 2020-05-07 MED ORDER — LABETALOL HCL 5 MG/ML IV SOLN
10.0000 mg | Freq: Once | INTRAVENOUS | Status: AC
  Administered 2020-05-07: 10 mg via INTRAVENOUS
  Filled 2020-05-07: qty 4

## 2020-05-07 NOTE — ED Notes (Signed)
Patient in hallway bed at this time. Patient able to transfer and ambulate independently.  Patient c/o headache 6/10. EDP aware, new orders to be placed.

## 2020-05-07 NOTE — ED Triage Notes (Signed)
Pt to ED via POV with Hypertension. Pt ambulatory without difficulty to triage 1. Pt states BP elevated x 24 hrs, pt c/o HA at this time. Pt A&O x4. Pt to triage 1 and immediately hands this RN home BP cuff. Pt's home BP cuff with hx of BP's with 414 systolic. Pt c/o HA, dizziness, and states "and of course just trying not to be over responsive".

## 2020-05-07 NOTE — ED Provider Notes (Signed)
[facility] Emergency Department Provider Note  ____________________________________________   Event Date/Time   First MD Initiated Contact with Patient 05/07/20 2013     (approximate)  I have reviewed the triage vital signs and the nursing notes.   HISTORY  Chief Complaint Hypertension   HPI Diane ALDERFER is a 64 y.o. female asthma, HTN, osteoporosis, anxiety, and alpha glycosides deficiency and mixed incontinence presents for assessment of headache and dizziness as well as some palpitations that began last night when she states her blood pressure was elevated on her home BP cuff with systolics measured slightly above 200.  Patient states she took her metoprolol which she is taking for her blood pressure does not think this helped too much.  She denies any other acute symptoms including chest pain, cough, fevers, chills, vision changes,         Past Medical History:  Diagnosis Date  . Alpha galactosidase deficiency   . Anxiety   . Asthma    from cats and horses only  . Cancer ([place]) 15 years ago   melanoma   . Fibroid    SUBMUCOUS MYOMA  . GERD (gastroesophageal reflux disease)   . Heart murmur   . Hypertension   . Osteoporosis 06/2017   T score -2.6  . Tumors    liver    Patient Active Problem List   Diagnosis Date Noted  . Mixed incontinence 04/04/2020  . Alcohol abuse 04/04/2020  . Anxiety 12/12/2016  . Allergy-induced asthma 12/12/2016  . Gastroesophageal reflux disease 06/27/2014  . Hypertension   . Osteoporosis     Past Surgical History:  Procedure Laterality Date  . CESAREAN SECTION    . ENDOMETRIAL ABLATION  2009   HER OPTION  . HYSTEROSCOPY  2008   MYOMECTOMY  . TUBAL LIGATION      Prior to Admission medications   Medication Sig Start Date End Date Taking? Authorizing Provider  diltiazem (TIAZAC) 300 MG 24 hr capsule Take 180 mg by mouth daily.     [provider]  esomeprazole (NEXIUM) 40 MG capsule  Take 20 mg by mouth daily before breakfast.     [provider]  metoprolol succinate (TOPROL-XL) 50 MG 24 hr tablet Take 1 tablet by mouth daily.  04/18/14   [provider]  mirtazapine (REMERON) 15 MG tablet Take 0.5 tablets (7.5 mg total) by mouth at bedtime. Patient not taking: Reported on 04/04/2020 12/03/18   Jearld Fenton, NP  oxybutynin (DITROPAN XL) 5 MG 24 hr tablet Take 1 tablet (5 mg total) by mouth at bedtime. 04/04/20   Jearld Fenton, NP  predniSONE (DELTASONE) 10 MG tablet Take 6 tabs day 1, 5 tabs day 2, 4 tabs day 3, 3 tabs day 4, 2 tabs day 5, 1 tab day 6 Patient not taking: Reported on 04/04/2020 05/04/18   Jearld Fenton, NP  VENTOLIN HFA 108 (90 Base) MCG/ACT inhaler INHALE 2 PUFFS INTO THE LUNGS EVERY 6 HOURS AS NEEDED FOR WHEEZING OR SHORTNESS OF BREATH 06/03/18   Jearld Fenton, NP    Allergies Ondansetron, Penicillins, Brimonidine, Meat [alpha-gal], Other, and Shellfish allergy  Family History  Problem Relation Age of Onset  . Hypertension Mother   . Parkinsonism Sister        PARKINSON'S DISEASE  . Breast cancer Other        Niece  Age 76  . Aneurysm Father   . Seizures Sister   . Heart disease Brother   .  Colon cancer Neg Hx   . Stomach cancer Neg Hx   . Esophageal cancer Neg Hx     Social History Social History   Tobacco Use  . Smoking status: Former Smoker    Types: Cigarettes  . Smokeless tobacco: Never Used  Vaping Use  . Vaping Use: Never used  Substance Use Topics  . Alcohol use: Yes    Alcohol/week: 14.0 standard drinks    Types: 7 Shots of liquor, 7 Standard drinks or equivalent per week    Comment: moderate..."at least one drink per day" Vodka per pt  . Drug use: Yes    Frequency: 2.0 times per week    Types: Marijuana    Comment: occasional     Review of Systems  Review of Systems  Constitutional: Negative for chills and fever.  HENT: Negative for sore throat.   Eyes: Positive for photophobia. Negative for  pain.  Respiratory: Negative for cough and stridor.   Cardiovascular: Positive for palpitations. Negative for chest pain.  Gastrointestinal: Negative for vomiting.  Genitourinary: Negative for dysuria.  Musculoskeletal: Negative for myalgias.  Skin: Negative for rash.  Neurological: Positive for dizziness and headaches. Negative for seizures and loss of consciousness.  Psychiatric/Behavioral: Negative for suicidal ideas.  All other systems reviewed and are negative.     ____________________________________________   PHYSICAL EXAM:  VITAL SIGNS: ED Triage Vitals [05/07/20 1911]  Enc Vitals Group     BP (!) 192/95     Pulse Rate 77     Resp 18     Temp 98.9 F (37.2 C)     Temp Source Oral     SpO2 98 %     Weight 115 lb (52.2 kg)     Height 5\' 1"  (1.549 m)     Head Circumference      Peak Flow      Pain Score 5     Pain Loc      Pain Edu?      Excl. in [place]?    Vitals:   05/07/20 1911 05/07/20 2133  BP: (!) 192/95 (!) 196/81  Pulse: 77 75  Resp: 18 17  Temp: 98.9 F (37.2 C)   SpO2: 98% 97%   Physical Exam Vitals and nursing note reviewed.  Constitutional:      General: She is not in acute distress.    Appearance: She is well-developed and well-nourished.  HENT:     Head: Normocephalic and atraumatic.     Right Ear: External ear normal.     Left Ear: External ear normal.     Nose: Nose normal.  Eyes:     Conjunctiva/sclera: Conjunctivae normal.  Cardiovascular:     Rate and Rhythm: Normal rate and regular rhythm.     Heart sounds: No murmur heard.   Pulmonary:     Effort: Pulmonary effort is normal. No respiratory distress.     Breath sounds: Normal breath sounds.  Abdominal:     Palpations: Abdomen is soft.     Tenderness: There is no abdominal tenderness.  Musculoskeletal:        General: No edema.     Cervical back: Neck supple.     Right lower leg: No edema.     Left lower leg: No edema.  Skin:    General: Skin is warm and dry.      Capillary Refill: Capillary refill takes less than 2 seconds.  Neurological:     Mental Status: She is alert and oriented  to person, place, and time.  Psychiatric:        Mood and Affect: Mood and affect and mood normal.     Cranial nerves II through XII grossly intact.  No pronator drift.  No finger dysmetria.  Symmetric 5/5 strength of all extremities.  Sensation intact to light touch in all extremities.  Unremarkable unassisted gait.  ____________________________________________   LABS (all labs ordered are listed, but only abnormal results are displayed)  Labs Reviewed  COMPREHENSIVE METABOLIC PANEL - Abnormal; Notable for the following components:      Result Value   Sodium 129 (*)    Chloride 94 (*)    Glucose, Bld 215 (*)    Creatinine, Ser 1.14 (*)    Total Protein 8.5 (*)    GFR, Estimated 53 (*)    All other components within normal limits  CBC  MAGNESIUM  TROPONIN I (HIGH SENSITIVITY)  TROPONIN I (HIGH SENSITIVITY)   ____________________________________________  EKG  Sinus rhythm with normal axis, unremarkable intervals, and some nonspecific ST changes in lead III and some artifact in V3 without clear evidence of acute ischemia ____________________________________________  RADIOLOGY  ED MD interpretation: CT head is unremarkable for evidence of hemorrhage or other acute intracranial pathology.  There is evidence of small vessel ischemia which appears chronic.  Chest x-ray shows no evidence of edema, focal consolidation, large effusion or other acute intrathoracic abnormality.  Official radiology report(s): DG Chest 2 View  Result Date: 05/07/2020 CLINICAL DATA:  Dizziness. EXAM: CHEST - 2 VIEW COMPARISON:  06/23/2008 FINDINGS: The cardiomediastinal contours are normal. The lungs are clear. Pulmonary vasculature is normal. No consolidation, pleural effusion, or pneumothorax. No acute osseous abnormalities are seen. Thoracic spondylosis. EKG leads overlie the  chest. IMPRESSION: No acute chest findings. Electronically Signed   By: Keith Rake M.D.   On: 05/07/2020 20:46   CT Head Wo Contrast  Result Date: 05/07/2020 CLINICAL DATA:  Headache, intracranial hemorrhage suspected Elevated blood pressure, systolic blood pressure 315. Dizziness. EXAM: CT HEAD WITHOUT CONTRAST TECHNIQUE: Contiguous axial images were obtained from the base of the skull through the vertex without intravenous contrast. COMPARISON:  None. FINDINGS: Brain: Brain volume is normal for age. Mild periventricular chronic small vessel ischemia. No intracranial hemorrhage, mass effect, or midline shift. No hydrocephalus. The basilar cisterns are patent. No evidence of territorial infarct or acute ischemia. No extra-axial or intracranial fluid collection. Vascular: No hyperdense vessel. Skull: No fracture or focal lesion. Sinuses/Orbits: Paranasal sinuses and mastoid air cells are clear. The visualized orbits are unremarkable. Other: None. IMPRESSION: 1. No acute intracranial abnormality. 2. Mild chronic small vessel ischemia. Electronically Signed   By: Keith Rake M.D.   On: 05/07/2020 21:02    ____________________________________________   PROCEDURES  Procedure(s) performed (including Critical Care):  .1-3 Lead EKG Interpretation Performed by: Lucrezia Starch, MD Authorized by: Lucrezia Starch, MD     Interpretation: normal     ECG rate assessment: normal     Rhythm: sinus rhythm     Ectopy: none     Conduction: normal       ____________________________________________   INITIAL IMPRESSION / ASSESSMENT AND PLAN / ED COURSE       Patient presents with [place] to history exam for assessment of above-noted symptoms in the setting of elevated blood pressures taken at home.  On arrival patient is hypertensive with a BP of 196/81 with otherwise stable vital signs on room air.  Impression is some hypertensive urgency causing symptoms.  No findings on exam or CT to  suggest CVA or intracranial hemorrhage.  Patient denies any chest pain and EKG in context of nonelevated findings is not suggestive of ACS.  No findings on history or exam to suggest dissection.  CMP shows improving hyponatremia compared to 6 months ago with NA 129 compared to 124 with otherwise no significant electrolyte or metabolic derangements.  Glucose is slightly elevated 215 without any evidence of acidosis.  Magnesium is unremarkable.  CBC is unremarkable.  No evidence of acute anemia.  Patient treated with some IV fluids headache cocktail and antihypertensive as noted below.  On my reassessment patient states she felt much improved and her blood pressure had down trended with systolic in the 423N.  Given improving blood pressure and symptoms with otherwise reassuring exam and work-up no evidence of endorgan damage at this time I believe she is safe for discharge with plan for close outpatient follow-up.  Advised patient that she to have her sodium rechecked again and patient voiced understanding and agreement this plan.  Discharged stable condition.  Strict return precautions advised and discussed.  ____________________________________________   FINAL CLINICAL IMPRESSION(S) / ED DIAGNOSES  Final diagnoses:  Hypertensive urgency  Chronic hyponatremia    Medications  lactated ringers bolus 1,000 mL (1,000 mLs Intravenous New Bag/Given 05/07/20 2139)  prochlorperazine (COMPAZINE) injection 10 mg (10 mg Intravenous Given 05/07/20 2135)  diphenhydrAMINE (BENADRYL) capsule 25 mg (25 mg Oral Given 05/07/20 2126)  labetalol (NORMODYNE) injection 10 mg (10 mg Intravenous Given 05/07/20 2135)     ED Discharge Orders    None       Note:  This document was prepared using Dragon voice recognition software and may include unintentional dictation errors.   Lucrezia Starch, MD 05/07/20 2209

## 2020-06-09 ENCOUNTER — Telehealth: Payer: Self-pay | Admitting: Internal Medicine

## 2020-06-09 NOTE — Telephone Encounter (Signed)
Is her cardiologist managing this for her? If she, he should order it.

## 2020-06-09 NOTE — Telephone Encounter (Signed)
Patients states that cardiologist normally orders a sodium test for her. She called in wanting to do a sodium test. Can we order this for her or does she need to call them? Please advise patient EM

## 2020-07-17 ENCOUNTER — Telehealth: Payer: Self-pay | Admitting: Gastroenterology

## 2020-07-17 NOTE — Telephone Encounter (Signed)
Good afternoon Dr. Silverio Decamp, patient was last seen here 5/1/2019then went to [facility] for a second opinion since she was still having issues.  She is due for colonoscopy 11/2021 but wanted to schedule sooner.  Records are in Epic.  Can you please review and advise on scheduling?  Thank you.

## 2020-07-18 NOTE — Telephone Encounter (Signed)
Ok to schedule office visit next available. Thanks

## 2020-07-19 NOTE — Telephone Encounter (Signed)
Called patient and left voice mail to call back to schedule office visit.

## 2020-08-29 ENCOUNTER — Ambulatory Visit: Payer: Medicare Other | Admitting: Gastroenterology

## 2020-09-27 DIAGNOSIS — I10 Essential (primary) hypertension: Secondary | ICD-10-CM | POA: Diagnosis not present

## 2020-09-27 DIAGNOSIS — E871 Hypo-osmolality and hyponatremia: Secondary | ICD-10-CM | POA: Insufficient documentation

## 2020-10-16 ENCOUNTER — Other Ambulatory Visit (INDEPENDENT_AMBULATORY_CARE_PROVIDER_SITE_OTHER): Payer: Medicare Other

## 2020-10-16 ENCOUNTER — Encounter: Payer: Self-pay | Admitting: Gastroenterology

## 2020-10-16 ENCOUNTER — Ambulatory Visit: Payer: Medicare Other | Admitting: Gastroenterology

## 2020-10-16 VITALS — BP 140/72 | HR 64 | Ht 61.0 in | Wt 114.2 lb

## 2020-10-16 DIAGNOSIS — K6289 Other specified diseases of anus and rectum: Secondary | ICD-10-CM | POA: Diagnosis not present

## 2020-10-16 DIAGNOSIS — E278 Other specified disorders of adrenal gland: Secondary | ICD-10-CM | POA: Diagnosis not present

## 2020-10-16 DIAGNOSIS — Z91018 Allergy to other foods: Secondary | ICD-10-CM

## 2020-10-16 DIAGNOSIS — M6289 Other specified disorders of muscle: Secondary | ICD-10-CM

## 2020-10-16 DIAGNOSIS — E871 Hypo-osmolality and hyponatremia: Secondary | ICD-10-CM

## 2020-10-16 DIAGNOSIS — K529 Noninfective gastroenteritis and colitis, unspecified: Secondary | ICD-10-CM

## 2020-10-16 LAB — CBC WITH DIFFERENTIAL/PLATELET
Basophils Absolute: 0 10*3/uL (ref 0.0–0.1)
Basophils Relative: 0.5 % (ref 0.0–3.0)
Eosinophils Absolute: 0 10*3/uL (ref 0.0–0.7)
Eosinophils Relative: 0.7 % (ref 0.0–5.0)
HCT: 39.5 % (ref 36.0–46.0)
Hemoglobin: 13.7 g/dL (ref 12.0–15.0)
Lymphocytes Relative: 22 % (ref 12.0–46.0)
Lymphs Abs: 1.4 10*3/uL (ref 0.7–4.0)
MCHC: 34.7 g/dL (ref 30.0–36.0)
MCV: 91.5 fl (ref 78.0–100.0)
Monocytes Absolute: 0.7 10*3/uL (ref 0.1–1.0)
Monocytes Relative: 11.2 % (ref 3.0–12.0)
Neutro Abs: 4 10*3/uL (ref 1.4–7.7)
Neutrophils Relative %: 65.6 % (ref 43.0–77.0)
Platelets: 305 10*3/uL (ref 150.0–400.0)
RBC: 4.32 Mil/uL (ref 3.87–5.11)
RDW: 14.6 % (ref 11.5–15.5)
WBC: 6.1 10*3/uL (ref 4.0–10.5)

## 2020-10-16 LAB — PROTIME-INR
INR: 1 ratio (ref 0.8–1.0)
Prothrombin Time: 11.3 s (ref 9.6–13.1)

## 2020-10-16 LAB — COMPREHENSIVE METABOLIC PANEL
ALT: 11 U/L (ref 0–35)
AST: 19 U/L (ref 0–37)
Albumin: 4.7 g/dL (ref 3.5–5.2)
Alkaline Phosphatase: 71 U/L (ref 39–117)
BUN: 12 mg/dL (ref 6–23)
CO2: 25 mEq/L (ref 19–32)
Calcium: 9.6 mg/dL (ref 8.4–10.5)
Chloride: 91 mEq/L — ABNORMAL LOW (ref 96–112)
Creatinine, Ser: 1.04 mg/dL (ref 0.40–1.20)
GFR: 55.87 mL/min — ABNORMAL LOW (ref >60.00)
Glucose, Bld: 95 mg/dL (ref 70–99)
Potassium: 5.2 mEq/L — ABNORMAL HIGH (ref 3.5–5.1)
Sodium: 126 mEq/L — ABNORMAL LOW (ref 135–145)
Total Bilirubin: 0.4 mg/dL (ref 0.2–1.2)
Total Protein: 8 g/dL (ref 6.0–8.3)

## 2020-10-16 MED ORDER — PLENVU 140 G PO SOLR: ORAL | 0 refills | Status: DC

## 2020-10-16 NOTE — Patient Instructions (Addendum)
You have been scheduled for a colonoscopy. Please follow written instructions given to you at your visit today.  Please pick up your prep supplies at the pharmacy within the next 1-3 days. If you use inhalers (even only as needed), please bring them with you on the day of your procedure.   Your provider has requested that you go to the basement level for lab work before leaving today. Press "B" on the elevator. The lab is located at the first door on the left as you exit the elevator.  Follow up after colonoscopy   We will refer you to endocrinology and they will contact you with that appointment  We will refer you to Pelvic Floor PT and contact you with that appointment   Quit drinking beer  Due to recent changes in healthcare laws, you may see the results of your imaging and laboratory studies on MyChart before your provider has had a chance to review them.  We understand that in some cases there may be results that are confusing or concerning to you. Not all laboratory results come back in the same time frame and the provider may be waiting for multiple results in order to interpret others.  Please give [place] 48 hours in order for your provider to thoroughly review all the results before contacting the office for clarification of your results.   I appreciate the  opportunity to care for you  Thank You   Harl Bowie , MD

## 2020-10-16 NOTE — Progress Notes (Signed)
Diane Gomez    [id number]    December 05, 1955  Primary Care Physician:Baity, Coralie Keens, NP  Referring Physician: Jearld Fenton, NP 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  Ongoing EtOH use.  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 With Recommendation for Pelvic floor retraining with continence strategies and correction of dyssynergic defecation. Pelvic heath therapy referral was placed. Unfortunately, she states that [facility] no longer accepts her insurance.   Colonoscopy December 11, 2016 - The perianal and digital rectal examinations were normal. - A 5 mm polyp was found in the transverse colon. The polyp was sessile. The polyp was removed with a cold snare. Resection and retrieval were complete.--Tubular adenoma - A 10 mm polyp was found in the sigmoid colon. The polyp was sessile. The polyp was removed with a hot snare. Resection and retrieval were complete.---Hyperplastic - Multiple small and large-mouthed diverticula were found in the sigmoid colon and descending colon. - Non-bleeding internal  hemorrhoids were found during retroflexion. The hemorrhoids were Small. Recommended recall colonoscopy in 5 years  EGD April 13, 2017: Unremarkable  MRI abdomen with and without contrast May 01, 2017 [place] atherosclerosis otherwise benign lesions noted bilateral kidney, adrenal and in the liver.  Hepatic steatosis. No further imaging was recommended for follow-up.  CT abdomen pelvis April 22, 2017 1. There is a new abnormal 2.6 cm hypodense lesion in segment 8 of the liver. Possibilities might include focal fatty infiltration, unusual hemangioma, or malignancy. Hepatic protocol MRI is recommended for further workup. 2. Small lesion in segment 6 of the liver was probably present in 2008 but obscured by streak artifact. Long-term persistence indicates a likely benign lesion. 3. Nonspecific 1.3 cm nodule in the left adrenal gland. This could also be further assessed at the time of MRI. 4. 6 mm left mid kidney calculus within a small calyceal diverticulum below a region of renal scarring. 5. New or enlarged 1.1 by 0.8 cm hypodense lesion in the left mid to lower kidney, probably a cyst but technically nonspecific due to small size. This also could be further characterized at the time of MRI. 6. Scattered descending and sigmoid colon diverticula without active diverticulitis. 7.  Aortic Atherosclerosis    Outpatient Encounter Medications as of 10/16/2020  Medication Sig  . ALPRAZolam (XANAX) 0.5 MG tablet Take 0.5 mg by mouth at bedtime as needed for anxiety.  Marland Kitchen diltiazem (TIAZAC) 300 MG 24 hr capsule Take 180 mg by mouth daily.  Marland Kitchen esomeprazole (NEXIUM) 20 MG capsule Take 1 capsule (20 mg total) by mouth  every other day.  . metoprolol succinate (TOPROL-XL) 50 MG 24 hr tablet Take 1 tablet by mouth daily.   . Probiotic Product (PROBIOTIC-10 PO) Take by mouth.  . VENTOLIN HFA 108 (90 Base) MCG/ACT inhaler INHALE 2 PUFFS INTO THE LUNGS EVERY 6 HOURS AS NEEDED FOR WHEEZING OR  SHORTNESS OF BREATH  . [DISCONTINUED] mirtazapine (REMERON) 15 MG tablet Take 0.5 tablets (7.5 mg total) by mouth at bedtime. (Patient not taking: Reported on 04/04/2020)  . [DISCONTINUED] oxybutynin (DITROPAN XL) 5 MG 24 hr tablet Take 1 tablet (5 mg total) by mouth at bedtime.  . [DISCONTINUED] predniSONE (DELTASONE) 10 MG tablet Take 6 tabs day 1, 5 tabs day 2, 4 tabs day 3, 3 tabs day 4, 2 tabs day 5, 1 tab day 6 (Patient not taking: Reported on 04/04/2020)   No facility-administered encounter medications on file as of 10/16/2020.    Allergies as of 10/16/2020 - Review Complete 10/16/2020  Allergen Reaction Noted  . Ondansetron  12/11/2017  . Penicillins Hives 10/04/2010  . Brimonidine  05/07/2018  . Meat [alpha-gal]  12/03/2018  . Other  12/11/2016  . Shellfish allergy  12/03/2018    Past Medical History:  Diagnosis Date  . Alpha galactosidase deficiency   . Anxiety   . Asthma    from cats and horses only  . Cancer ([place]) 15 years ago   melanoma   . Fibroid    SUBMUCOUS MYOMA  . GERD (gastroesophageal reflux disease)   . Heart murmur   . Hypertension   . Osteoporosis 06/2017   T score -2.6  . Tumors    liver    Past Surgical History:  Procedure Laterality Date  . CESAREAN SECTION    . ENDOMETRIAL ABLATION  2009   HER OPTION  . HYSTEROSCOPY  2008   MYOMECTOMY  . TUBAL LIGATION      Family History  Problem Relation Age of Onset  . Hypertension Mother   . Parkinsonism Sister        PARKINSON'S DISEASE  . Breast cancer Other        Niece  Age 25  . Aneurysm Father   . Seizures Sister   . Heart disease Brother   . Colon cancer Neg Hx   . Stomach cancer Neg Hx   . Esophageal cancer Neg Hx     Social History   Socioeconomic History  . Marital status: Divorced    Spouse name: Not on file  . Number of children: Not on file  . Years of education: Not on file  . Highest education level: Not on file  Occupational History  . Not on file  Tobacco Use  .  Smoking status: Former Smoker    Types: Cigarettes  . Smokeless tobacco: Never Used  Vaping Use  . Vaping Use: Never used  Substance and Sexual Activity  . Alcohol use: Yes    Alcohol/week: 14.0 standard drinks    Types: 7 Shots of liquor, 7 Standard drinks or equivalent per week    Comment: moderate..."at least one drink per day" Vodka per pt  . Drug use: Yes    Frequency: 2.0 times per week    Types: Marijuana    Comment: occasional   . Sexual activity: Not Currently    Birth control/protection: Post-menopausal, Surgical  Other Topics Concern  . Not on file  Social History Narrative  . Not on file   Social Determinants of Health   Financial Resource Strain: Not on file  Food Insecurity: Not on file  Transportation Needs: Not on file  Physical Activity: Not on file  Stress: Not on file  Social Connections: Not on file  Intimate Partner Violence: Not on file      Review of systems: All other review of systems negative except as mentioned in the HPI.   Physical Exam: Vitals:   10/16/20 1009  BP: 140/72  Pulse: 64   Body mass index is 21.58 kg/m. Gen:      No acute distress HEENT:  sclera anicteric Abd:      soft, non-tender; no palpable masses, no distension Ext:    No edema Neuro: alert and oriented x 3 Psych: normal mood and affect  Data Reviewed:  Reviewed labs, radiology imaging, old records and pertinent past GI work up   Assessment and Plan/Recommendations:  65 year old very pleasant female with chronic diarrhea, alpha gal allergy  Chronic diarrhea:  GI pathogen panel, O&P, fecal fat and fecal elastase We will schedule for colonoscopy with biopsies to exclude microscopic colitis The risks and benefits as well as alternatives of endoscopic procedure(s) have been discussed and reviewed. All questions answered. The patient agrees to proceed.  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 The patient agreed with the plan and demonstrated an understanding of the instructions.  Damaris Hippo , MD    CC: Jearld Fenton, NP

## 2020-10-17 ENCOUNTER — Other Ambulatory Visit: Payer: Medicare Other

## 2020-10-17 DIAGNOSIS — K529 Noninfective gastroenteritis and colitis, unspecified: Secondary | ICD-10-CM

## 2020-10-17 DIAGNOSIS — A048 Other specified bacterial intestinal infections: Secondary | ICD-10-CM | POA: Diagnosis not present

## 2020-10-17 DIAGNOSIS — Z91018 Allergy to other foods: Secondary | ICD-10-CM

## 2020-10-17 DIAGNOSIS — M6289 Other specified disorders of muscle: Secondary | ICD-10-CM

## 2020-10-17 DIAGNOSIS — E871 Hypo-osmolality and hyponatremia: Secondary | ICD-10-CM

## 2020-10-19 LAB — OVA AND PARASITE EXAMINATION
CONCENTRATE RESULT:: NONE SEEN
MICRO NUMBER:: 11928137
SPECIMEN QUALITY:: ADEQUATE
TRICHROME RESULT:: NONE SEEN

## 2020-10-21 LAB — GI PROFILE, STOOL, PCR

## 2020-10-21 LAB — FECAL FAT, QUALITATIVE
Fat Qual Neutral, Stl: NORMAL
Fat Qual Total, Stl: NORMAL

## 2020-10-24 LAB — ALPHA-GAL PANEL
Beef IgE: 0.12 kU/L (ref <0.35)
Class: 0
Class: 0
Galactose-alpha-1,3-galactose IgE: 0.22 kU/L — ABNORMAL HIGH (ref <0.10)
LAMB/MUTTON IGE: <0.1 kU/L (ref <0.35)
Pork IgE: <0.1 kU/L (ref <0.35)

## 2020-10-27 ENCOUNTER — Telehealth: Payer: Self-pay | Admitting: Gastroenterology

## 2020-10-27 NOTE — Telephone Encounter (Signed)
Inbound call from patient stating per pharmacy, Plenvu requires prior authorization.  Please advise.

## 2020-10-27 NOTE — Telephone Encounter (Signed)
L/m for patient that I will have her a sample Plenvu kit out front for her. We dont to prior authorizations for colonoscopy preps

## 2020-10-30 DIAGNOSIS — E871 Hypo-osmolality and hyponatremia: Secondary | ICD-10-CM | POA: Insufficient documentation

## 2020-10-30 DIAGNOSIS — I10 Essential (primary) hypertension: Secondary | ICD-10-CM | POA: Insufficient documentation

## 2020-11-01 ENCOUNTER — Telehealth: Payer: Self-pay | Admitting: Gastroenterology

## 2020-11-01 ENCOUNTER — Encounter: Payer: Medicare Other | Admitting: Gastroenterology

## 2020-11-01 NOTE — Telephone Encounter (Signed)
ok 

## 2020-11-01 NOTE — Telephone Encounter (Signed)
-----   Message from Greggory Keen, LPN sent at 06/28/5670  4:57 PM EDT ----- Patient called late (4:45 pm) She has had a headache today. She thought it was because she was on clear liquids. She says she is feeling worse and worse. T 99.7. A little nauseated.  She has rescheduled the colonoscopy out of an abundance of caution.

## 2020-11-23 DIAGNOSIS — F1010 Alcohol abuse, uncomplicated: Secondary | ICD-10-CM

## 2020-11-23 HISTORY — DX: Alcohol abuse, uncomplicated: F10.10

## 2020-11-30 ENCOUNTER — Other Ambulatory Visit: Payer: Self-pay | Admitting: Nephrology

## 2020-11-30 DIAGNOSIS — E871 Hypo-osmolality and hyponatremia: Secondary | ICD-10-CM

## 2020-11-30 DIAGNOSIS — N281 Cyst of kidney, acquired: Secondary | ICD-10-CM

## 2020-11-30 DIAGNOSIS — I10 Essential (primary) hypertension: Secondary | ICD-10-CM

## 2020-11-30 DIAGNOSIS — F1010 Alcohol abuse, uncomplicated: Secondary | ICD-10-CM

## 2020-12-12 ENCOUNTER — Telehealth: Payer: Self-pay | Admitting: Gastroenterology

## 2020-12-12 NOTE — Telephone Encounter (Signed)
Inbound call from patient. Rescheduled her procedure to 7/28 @ 8:30. Would like a call with updated instructions.

## 2020-12-13 NOTE — Telephone Encounter (Signed)
Reviewed new instructions with the patient. She wrote the new times down an read them back to me correctly. The "day before" will remain the same. She will drink the second half of her prep at 3:30 am 12/21/20. She will be NPO starting at 5:30 am. She will arrive at 7:30 am to [facility].

## 2020-12-20 ENCOUNTER — Ambulatory Visit: Payer: Medicare Other

## 2020-12-21 ENCOUNTER — Encounter: Payer: Self-pay | Admitting: Gastroenterology

## 2020-12-21 ENCOUNTER — Ambulatory Visit (AMBULATORY_SURGERY_CENTER): Payer: Medicare Other | Admitting: Gastroenterology

## 2020-12-21 ENCOUNTER — Other Ambulatory Visit: Payer: Self-pay

## 2020-12-21 VITALS — BP 181/91 | HR 54 | Temp 97.9°F | Resp 11 | Ht 61.0 in | Wt 114.0 lb

## 2020-12-21 DIAGNOSIS — K5289 Other specified noninfective gastroenteritis and colitis: Secondary | ICD-10-CM

## 2020-12-21 DIAGNOSIS — K5730 Diverticulosis of large intestine without perforation or abscess without bleeding: Secondary | ICD-10-CM | POA: Diagnosis not present

## 2020-12-21 DIAGNOSIS — K6389 Other specified diseases of intestine: Secondary | ICD-10-CM | POA: Diagnosis not present

## 2020-12-21 DIAGNOSIS — K529 Noninfective gastroenteritis and colitis, unspecified: Secondary | ICD-10-CM

## 2020-12-21 DIAGNOSIS — K633 Ulcer of intestine: Secondary | ICD-10-CM | POA: Diagnosis not present

## 2020-12-21 MED ORDER — SODIUM CHLORIDE 0.9 % IV SOLN: 500.0000 mL | Freq: Once | INTRAVENOUS | Status: DC

## 2020-12-21 MED ORDER — BUDESONIDE 3 MG PO CPEP: 9.0000 mg | ORAL_CAPSULE | Freq: Every day | ORAL | 0 refills | Status: AC

## 2020-12-21 NOTE — Patient Instructions (Signed)
YOU HAD AN ENDOSCOPIC PROCEDURE TODAY AT THE [facility]:   Refer to the procedure report that was given to you for any specific questions about what was found during the examination.  If the procedure report does not answer your questions, please call your gastroenterologist to clarify.  If you requested that your care partner not be given the details of your procedure findings, then the procedure report has been included in a sealed envelope for you to review at your convenience later.  YOU SHOULD EXPECT: Some feelings of bloating in the abdomen. Passage of more gas than usual.  Walking can help get rid of the air that was put into your GI tract during the procedure and reduce the bloating. If you had a lower endoscopy (such as a colonoscopy or flexible sigmoidoscopy) you may notice spotting of blood in your stool or on the toilet paper. If you underwent a bowel prep for your procedure, you may not have a normal bowel movement for a few days.  Please Note:  You might notice some irritation and congestion in your nose or some drainage.  This is from the oxygen used during your procedure.  There is no need for concern and it should clear up in a day or so.  SYMPTOMS TO REPORT IMMEDIATELY:   Following lower endoscopy (colonoscopy or flexible sigmoidoscopy):  Excessive amounts of blood in the stool  Significant tenderness or worsening of abdominal pains  Swelling of the abdomen that is new, acute  Fever of 100F or higher  For urgent or emergent issues, a gastroenterologist can be reached at any hour by calling [phone]. Do not use MyChart messaging for urgent concerns.    DIET:  We do recommend a small meal at first, but then you may proceed to your regular diet.  Drink plenty of fluids but you should avoid alcoholic beverages for 24 hours.  ACTIVITY:  You should plan to take it easy for the rest of today and you should NOT DRIVE or use heavy machinery until tomorrow (because  of the sedation medicines used during the test).    FOLLOW UP: Our staff will call the number listed on your records 48-72 hours following your procedure to check on you and address any questions or concerns that you may have regarding the information given to you following your procedure. If we do not reach you, we will leave a message.  We will attempt to reach you two times.  During this call, we will ask if you have developed any symptoms of COVID 19. If you develop any symptoms (ie: fever, flu-like symptoms, shortness of breath, cough etc.) before then, please call [phone].  If you test positive for Covid 19 in the 2 weeks post procedure, please call and report this information to us.    If any biopsies were taken you will be contacted by phone or by letter within the next 1-3 weeks.  Please call us at [phone] if you have not heard about the biopsies in 3 weeks.    SIGNATURES/CONFIDENTIALITY: You and/or your care partner have signed paperwork which will be entered into your electronic medical record.  These signatures attest to the fact that that the information above on your After Visit Summary has been reviewed and is understood.  Full responsibility of the confidentiality of this discharge information lies with you and/or your care-partner. 

## 2020-12-21 NOTE — Progress Notes (Signed)
Report to PACU, RN, vss, BBS= Clear.  

## 2020-12-21 NOTE — Progress Notes (Signed)
Medical history reviewed with no changes noted. VS assessed by C.W 

## 2020-12-21 NOTE — Progress Notes (Signed)
Called to room to assist during endoscopic procedure.  Patient ID and intended procedure confirmed with present staff. Received instructions for my participation in the procedure from the performing physician.  

## 2020-12-21 NOTE — Op Note (Signed)
[facility] Patient Name: Diane Gomez Procedure Date: 12/21/2020 8:50 AM MRN: [id number] Endoscopist: Mauri Pole , MD Age: 65 Referring MD:  Date of Birth: December 18, 1955 Gender: Female Account #: [id number] Procedure:                Colonoscopy Indications:              Clinically significant diarrhea of unexplained                            origin Medicines:                Monitored Anesthesia Care Procedure:                Pre-Anesthesia Assessment:                           - Prior to the procedure, a History and Physical                            was performed, and patient medications and                            allergies were reviewed. The patient's tolerance of                            previous anesthesia was also reviewed. The risks                            and benefits of the procedure and the sedation                            options and risks were discussed with the patient.                            All questions were answered, and informed consent                            was obtained. Prior Anticoagulants: The patient has                            taken no previous anticoagulant or antiplatelet                            agents. ASA Grade Assessment: II - A patient with                            mild systemic disease. After reviewing the risks                            and benefits, the patient was deemed in                            satisfactory condition to undergo the procedure.  After obtaining informed consent, the colonoscope                            was passed under direct vision. Throughout the                            procedure, the patient's blood pressure, pulse, and                            oxygen saturations were monitored continuously. The                            PCF-HQ190L Colonoscope was introduced through the                            anus and advanced to the the terminal ileum, with                             identification of the appendiceal orifice and IC                            valve. The colonoscopy was performed without                            difficulty. The patient tolerated the procedure                            well. The quality of the bowel preparation was                            good. The terminal ileum, ileocecal valve,                            appendiceal orifice, and rectum were photographed. Scope In: 9:01:04 AM Scope Out: 9:13:57 AM Scope Withdrawal Time: 0 hours 7 minutes 55 seconds  Total Procedure Duration: 0 hours 12 minutes 53 seconds  Findings:                 The perianal and digital rectal examinations were                            normal.                           A few <five mm ulcers were found in the sigmoid                            colon. No bleeding was present. Biopsies were taken                            with a cold forceps for histology.                           Scattered small and large-mouthed diverticula were  found in the sigmoid colon and descending colon.                            There was narrowing of the colon in association                            with the diverticular opening. There was evidence                            of diverticular spasm. Peri-diverticular erythema                            was seen.                           Segmental and patchy mild inflammation                            characterized by altered vascularity, congestion                            (edema), erosions, erythema, mucus and shallow                            ulcerations was found in the sigmoid colon and in                            the descending colon. Biopsies were taken with a                            cold forceps for histology.                           The terminal ileum appeared normal. Biopsies were                            taken with a cold forceps for histology. Biopsies                             for histology were taken with a cold forceps from                            the right colon and left colon for evaluation of                            microscopic colitis.                           Non-bleeding external and internal hemorrhoids were                            found during retroflexion. The hemorrhoids were                            small. Complications:  No immediate complications. Estimated Blood Loss:     Estimated blood loss was minimal. Impression:               - A few ulcers in the sigmoid colon. Biopsied.                           - Severe diverticulosis in the sigmoid colon and in                            the descending colon. There was narrowing of the                            colon in association with the diverticular opening.                            There was evidence of diverticular spasm.                            Peri-diverticular erythema was seen.                           - Segmental and patchy mild inflammation was found                            in the sigmoid colon and in the descending colon                            secondary to colitis. Biopsied. (Crohn's disease or                            segmental colitis associated with diverticulosis in                            the differential)                           - The examined portion of the ileum was normal.                            Biopsied.                           - Non-bleeding external and internal hemorrhoids. Recommendation:           - Patient has a contact number available for                            emergencies. The signs and symptoms of potential                            delayed complications were discussed with the                            patient. Return to normal activities tomorrow.  Written discharge instructions were provided to the                            patient.                           -  Resume previous diet.                           - Continue present medications.                           - Await pathology results.                           - Budesonide '9mg'$  daily. Please send Rx 90 days                           - Schedule follow up office visit soon in 2-4 weeks                            with Dr Silverio Decamp or APP Mauri Pole, MD 12/21/2020 9:22:02 AM This report has been signed electronically.

## 2020-12-25 ENCOUNTER — Telehealth: Payer: Self-pay | Admitting: *Deleted

## 2020-12-25 NOTE — Telephone Encounter (Signed)
  Follow up Call-  Call back number 12/21/2020  Post procedure Call Back phone  # [phone]  Permission to leave phone message Yes  Some recent data might be hidden     Patient questions:  Do you have a fever, pain , or abdominal swelling? No. Pain Score  0 *  Have you tolerated food without any problems? Yes.    Have you been able to return to your normal activities? Yes.    Do you have any questions about your discharge instructions: Diet   No. Medications  No. Follow up visit  No.  Do you have questions or concerns about your Care? No.  Actions: * If pain score is 4 or above: No action needed, pain <4.  Have you developed a fever since your procedure? no  2.   Have you had an respiratory symptoms (SOB or cough) since your procedure? no  3.   Have you tested positive for COVID 19 since your procedure no  4.   Have you had any family members/close contacts diagnosed with the COVID 19 since your procedure?  no   If yes to any of these questions please route to Joylene John, RN and Joella Prince, RN

## 2021-01-03 ENCOUNTER — Ambulatory Visit
Admission: RE | Admit: 2021-01-03 | Discharge: 2021-01-03 | Disposition: A | Discharge status: Home / Self Care | Payer: Medicare Other | Source: Ambulatory Visit | Attending: Nephrology | Admitting: Nephrology

## 2021-01-03 ENCOUNTER — Other Ambulatory Visit: Payer: Self-pay

## 2021-01-03 DIAGNOSIS — F1010 Alcohol abuse, uncomplicated: Secondary | ICD-10-CM | POA: Diagnosis not present

## 2021-01-03 DIAGNOSIS — I10 Essential (primary) hypertension: Secondary | ICD-10-CM | POA: Diagnosis present

## 2021-01-03 DIAGNOSIS — N281 Cyst of kidney, acquired: Secondary | ICD-10-CM | POA: Diagnosis present

## 2021-01-03 DIAGNOSIS — E871 Hypo-osmolality and hyponatremia: Secondary | ICD-10-CM

## 2021-01-15 ENCOUNTER — Encounter: Payer: Self-pay | Admitting: Gastroenterology

## 2021-01-19 ENCOUNTER — Telehealth: Payer: Self-pay | Admitting: Gastroenterology

## 2021-01-19 ENCOUNTER — Other Ambulatory Visit: Payer: Self-pay

## 2021-01-19 MED ORDER — GLYCOPYRROLATE 1 MG PO TABS: 1.0000 mg | ORAL_TABLET | Freq: Two times a day (BID) | ORAL | 1 refills | Status: DC | PRN

## 2021-01-19 NOTE — Telephone Encounter (Signed)
Please advise patient to complete the current budesonide course, will not plan to continue the course given she has no significant improvement.  Please send prescription for Robinul 1 tablet twice daily as needed for severe abdominal cramping or diarrhea.  Please schedule follow-up office visit soon .  Thanks

## 2021-01-19 NOTE — Telephone Encounter (Signed)
Patient instructed.  Appointment in September.

## 2021-01-19 NOTE — Telephone Encounter (Signed)
Patient states she is taking budesonide "religiously" and she is having loose, explosive bowel movements.Says she has fecal leakage at times. No abdominal pain except when she is going to have the bowel movement and then there is urgency. This happens at least twice a day when she eats. Please advise.

## 2021-01-19 NOTE — Telephone Encounter (Signed)
Inbound call from patient requesting a call from a nurse please.  States she never has received colon results.

## 2021-01-24 ENCOUNTER — Encounter: Payer: Medicare Other | Admitting: Gastroenterology

## 2021-01-25 DIAGNOSIS — N1831 Chronic kidney disease, stage 3a: Secondary | ICD-10-CM | POA: Insufficient documentation

## 2021-02-20 ENCOUNTER — Encounter: Payer: Self-pay | Admitting: Gastroenterology

## 2021-02-20 ENCOUNTER — Ambulatory Visit: Payer: Medicare Other | Admitting: Gastroenterology

## 2021-02-20 ENCOUNTER — Other Ambulatory Visit: Payer: Self-pay | Admitting: Internal Medicine

## 2021-02-20 VITALS — BP 160/78 | HR 75 | Ht 61.0 in | Wt 120.0 lb

## 2021-02-20 DIAGNOSIS — N183 Chronic kidney disease, stage 3 unspecified: Secondary | ICD-10-CM

## 2021-02-20 DIAGNOSIS — K580 Irritable bowel syndrome with diarrhea: Secondary | ICD-10-CM | POA: Diagnosis not present

## 2021-02-20 DIAGNOSIS — F1010 Alcohol abuse, uncomplicated: Secondary | ICD-10-CM

## 2021-02-20 DIAGNOSIS — T781XXA Other adverse food reactions, not elsewhere classified, initial encounter: Secondary | ICD-10-CM

## 2021-02-20 DIAGNOSIS — K559 Vascular disorder of intestine, unspecified: Secondary | ICD-10-CM

## 2021-02-20 DIAGNOSIS — R140 Abdominal distension (gaseous): Secondary | ICD-10-CM

## 2021-02-20 DIAGNOSIS — R109 Unspecified abdominal pain: Secondary | ICD-10-CM

## 2021-02-20 DIAGNOSIS — T7819XA Other adverse food reactions, not elsewhere classified, initial encounter: Secondary | ICD-10-CM

## 2021-02-20 DIAGNOSIS — E871 Hypo-osmolality and hyponatremia: Secondary | ICD-10-CM

## 2021-02-20 MED ORDER — ALBUTEROL SULFATE HFA 108 (90 BASE) MCG/ACT IN AERS: 2.0000 | INHALATION_SPRAY | Freq: Four times a day (QID) | RESPIRATORY_TRACT | 0 refills | Status: DC | PRN

## 2021-02-20 NOTE — Telephone Encounter (Signed)
Spoke to patient by telephone and advised her that the Epi-pen is not on her medication list. Patient stated that the epi-pen was given to her by a doctor in 2018 at [facility] because of a tick disease that she has. Patient stated that the epi pen expired in 2020. Patient stated that this would be a new script by Webb Silversmith NP. Last office visit 04/04/20 Upcoming appointment 05/23/21 Romilda Garret NP

## 2021-02-20 NOTE — Progress Notes (Signed)
Diane Gomez    [id number]    [birth year]  Primary Care Physician:Baity, Coralie Keens, NP  Referring Physician: Jearld Fenton, NP [place],  [place] [address]   Chief complaint:  Diarrhea  HPI:  65 year old very pleasant female here with complaints of chronic diarrhea.  Overall she feels diarrhea has improved with daily budesonide, she is having formed 1-2 bowel movements occasional intermittent diarrhea and fecal urgency.  Denies any rectal bleeding.  She continues to drink wine unclear when her last alcohol use was, ?  2 weeks Patient is upset about chronic kidney disease and electrolyte abnormality, she feels Discussed it with her.  Colonoscopy with biopsies negative for microscopic colitis, had mild ischemic colitis changes.  She was empirically started on budesonide 9 mg daily, has history of alpha gal allergy.  She is avoiding red meat.  She was also lactose intolerant, loves cheese and given her diet is already restricted she finds it hard to avoid milk or dairy products.  Colonoscopy 12/21/2020 - The perianal and digital rectal examinations were normal. - A few <five mm ulcers were found in the sigmoid colon. No bleeding was present. Biopsies were taken with a cold forceps for histology. - Scattered small and large-mouthed diverticula were found in the sigmoid colon and descending colon. There was narrowing of the colon in association with the diverticular opening. There was evidence of diverticular spasm. Peri-diverticular erythema was seen. - Segmental and patchy mild inflammation characterized by altered vascularity, congestion (edema), erosions, erythema, mucus and shallow ulcerations was found in the sigmoid colon and in the descending colon. Biopsies were taken with a cold forceps for histology. - The terminal ileum appeared normal. Biopsies were taken with a cold forceps for histology. Biopsies for histology were taken with a cold forceps from the  right colon and left colon for evaluation of microscopic colitis. - Non-bleeding external and internal hemorrhoids were found during retroflexion. The hemorrhoids were small.  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   Ongoing EtOH use.  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 With Recommendation for Pelvic floor retraining with continence strategies and correction of dyssynergic defecation. Pelvic heath therapy referral was placed. Unfortunately, she states that [facility] no longer accepts her insurance.     Colonoscopy December 11, 2016 - The perianal and digital rectal examinations were normal. - A 5 mm polyp was found in the transverse colon. The polyp was sessile. The polyp was removed with a cold snare. Resection and retrieval were complete.--Tubular adenoma - A 10 mm polyp was found in the sigmoid colon. The polyp was sessile. The polyp was removed with a hot snare. Resection and retrieval were complete.---Hyperplastic - Multiple small and large-mouthed diverticula were  found in the sigmoid colon and descending colon. - Non-bleeding internal hemorrhoids were found during retroflexion. The hemorrhoids were Small. Recommended recall colonoscopy in 5 years   EGD April 13, 2017: Unremarkable   MRI  abdomen with and without contrast May 01, 2017 Arctic atherosclerosis otherwise benign lesions noted bilateral kidney, adrenal and in the liver.  Hepatic steatosis. No further imaging was recommended for follow-up.   CT abdomen pelvis April 22, 2017 1. There is a new abnormal 2.6 cm hypodense lesion in segment 8 of the liver. Possibilities might include focal fatty infiltration, unusual hemangioma, or malignancy. Hepatic protocol MRI is recommended for further workup. 2. Small lesion in segment 6 of the liver was probably present in 2008 but obscured by streak artifact. Long-term persistence indicates a likely benign lesion. 3. Nonspecific 1.3 cm nodule in the left adrenal gland. This could also be further assessed at the time of MRI. 4. 6 mm left mid kidney calculus within a small calyceal diverticulum below a region of renal scarring. 5. New or enlarged 1.1 by 0.8 cm hypodense lesion in the left mid to lower kidney, probably a cyst but technically nonspecific due to small size. This also could be further characterized at the time of MRI. 6. Scattered descending and sigmoid colon diverticula without active diverticulitis. 7.  Aortic Atherosclerosis       Outpatient Encounter Medications as of 02/20/2021  Medication Sig   ALPRAZolam (XANAX) 0.5 MG tablet Take 0.5 mg by mouth at bedtime as needed for anxiety.   budesonide (ENTOCORT EC) 3 MG 24 hr capsule Take 3 capsules (9 mg total) by mouth daily.   diltiazem (TIAZAC) 300 MG 24 hr capsule Take 180 mg by mouth daily.   esomeprazole (NEXIUM) 20 MG capsule Take 1 capsule (20 mg total) by mouth every other day.   glycopyrrolate (ROBINUL) 1 MG tablet Take 1 tablet (1 mg total) by mouth 2 (two) times daily as needed. Abdominal pain or cramps   metoprolol succinate (TOPROL-XL) 50 MG 24 hr tablet Take 1 tablet by mouth daily.    Probiotic Product (PROBIOTIC-10 PO) Take by mouth.   VENTOLIN HFA 108 (90 Base) MCG/ACT inhaler INHALE 2  PUFFS INTO THE LUNGS EVERY 6 HOURS AS NEEDED FOR WHEEZING OR SHORTNESS OF BREATH   No facility-administered encounter medications on file as of 02/20/2021.    Allergies as of 02/20/2021 - Review Complete 02/20/2021  Allergen Reaction Noted   Ondansetron  12/11/2017   Penicillins Hives 10/04/2010   Brimonidine  05/07/2018   Meat [alpha-gal]  12/03/2018   Other  12/11/2016   Shellfish allergy  12/03/2018    Past Medical History:  Diagnosis Date   Allergy    Alpha galactosidase deficiency    Anemia    Anxiety    Asthma    from cats and horses only   Cancer (Friend) 15 years ago   melanoma    Fibroid    SUBMUCOUS MYOMA   GERD (gastroesophageal reflux disease)    Heart murmur    Hypertension    Osteoporosis 06/2017   T score -2.6   Tumors    liver    Past Surgical History:  Procedure Laterality Date   CESAREAN SECTION     ENDOMETRIAL ABLATION  2009   HER OPTION   HYSTEROSCOPY  2008   MYOMECTOMY   TUBAL LIGATION      Family History  Problem Relation Age of Onset   Hypertension Mother    Parkinsonism Sister  PARKINSON'S DISEASE   Breast cancer Other        Niece  Age 23   Aneurysm Father    Seizures Sister    Heart disease Brother    Colon cancer Neg Hx    Stomach cancer Neg Hx    Esophageal cancer Neg Hx     Social History   Socioeconomic History   Marital status: Divorced    Spouse name: Not on file   Number of children: Not on file   Years of education: Not on file   Highest education level: Not on file  Occupational History   Not on file  Tobacco Use   Smoking status: Former    Types: Cigarettes   Smokeless tobacco: Never  Vaping Use   Vaping Use: Never used  Substance and Sexual Activity   Alcohol use: Yes    Alcohol/week: 14.0 standard drinks    Types: 7 Shots of liquor, 7 Standard drinks or equivalent per week    Comment: moderate..."at least one drink per day" Vodka per pt   Drug use: Yes    Frequency: 2.0 times per week    Types:  Marijuana    Comment: occasional    Sexual activity: Not Currently    Birth control/protection: Post-menopausal, Surgical  Other Topics Concern   Not on file  Social History Narrative   Not on file   Social Determinants of Health   Financial Resource Strain: Not on file  Food Insecurity: Not on file  Transportation Needs: Not on file  Physical Activity: Not on file  Stress: Not on file  Social Connections: Not on file  Intimate Partner Violence: Not on file      Review of systems: All other review of systems negative except as mentioned in the HPI.   Physical Exam: Vitals:   02/20/21 0834  BP: (!) 160/78  Pulse: 75   Body mass index is 22.67 kg/m. 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 The patient was provided an opportunity to  ask questions and all were answered. The patient agreed with the plan and demonstrated an understanding of the instructions.  Damaris Hippo , MD    CC: Jearld Fenton, NP

## 2021-02-20 NOTE — Patient Instructions (Addendum)
Budesonide taper:  6 mg daily for 2 weeks then decrease to 3 mg daily for 4 weeks then stop  Use Robinul twice daily as needed  If you are age 67 or older, your body mass index should be between 23-30. Your Body mass index is 22.67 kg/m. If this is out of the aforementioned range listed, please consider follow up with your Primary Care Provider.  If you are age 83 or younger, your body mass index should be between 19-25. Your Body mass index is 22.67 kg/m. If this is out of the aformentioned range listed, please consider follow up with your Primary Care Provider.   __________________________________________________________  The [facility] providers would like to encourage you to use [facility] to communicate with providers for non-urgent requests or questions.  Due to long hold times on the telephone, sending your provider a message by [facility] may be a faster and more efficient way to get a response.  Please allow 48 business hours for a response.  Please remember that this is for non-urgent requests.    I appreciate the  opportunity to care for you  Thank You   Harl Bowie , MD

## 2021-02-20 NOTE — Telephone Encounter (Signed)
Pt call in requesting refills  Ambuterol inhaler and Epipen 3mg  . [place], [place]  Please advise [phone]

## 2021-02-21 ENCOUNTER — Encounter: Payer: Self-pay | Admitting: Gastroenterology

## 2021-02-21 DIAGNOSIS — N281 Cyst of kidney, acquired: Secondary | ICD-10-CM | POA: Insufficient documentation

## 2021-02-21 DIAGNOSIS — K580 Irritable bowel syndrome with diarrhea: Secondary | ICD-10-CM | POA: Insufficient documentation

## 2021-02-21 NOTE — Telephone Encounter (Signed)
Patient is scheduled to see you in December. Will you give her a script for an Epi pen which would be a new script from our office. See previous notes.

## 2021-02-21 NOTE — Telephone Encounter (Signed)
YesDutch Quint NP sent in the inhaler but the  Epi pen was not addressed. Thanks

## 2021-02-22 ENCOUNTER — Other Ambulatory Visit: Payer: Self-pay | Admitting: Nurse Practitioner

## 2021-02-22 DIAGNOSIS — Z91038 Other insect allergy status: Secondary | ICD-10-CM

## 2021-02-22 MED ORDER — EPINEPHRINE 0.3 MG/0.3ML IJ SOAJ: 0.3000 mg | INTRAMUSCULAR | 0 refills | Status: DC | PRN

## 2021-04-08 ENCOUNTER — Emergency Department: Payer: Medicare Other

## 2021-04-08 ENCOUNTER — Encounter: Payer: Self-pay | Admitting: Emergency Medicine

## 2021-04-08 ENCOUNTER — Other Ambulatory Visit: Payer: Self-pay

## 2021-04-08 ENCOUNTER — Emergency Department
Admission: EM | Admit: 2021-04-08 | Discharge: 2021-04-08 | Disposition: A | Discharge status: Home / Self Care | Payer: Medicare Other | Attending: Emergency Medicine | Admitting: Emergency Medicine

## 2021-04-08 DIAGNOSIS — J45909 Unspecified asthma, uncomplicated: Secondary | ICD-10-CM | POA: Diagnosis not present

## 2021-04-08 DIAGNOSIS — R0789 Other chest pain: Secondary | ICD-10-CM | POA: Diagnosis not present

## 2021-04-08 DIAGNOSIS — I10 Essential (primary) hypertension: Secondary | ICD-10-CM | POA: Insufficient documentation

## 2021-04-08 DIAGNOSIS — Z87891 Personal history of nicotine dependence: Secondary | ICD-10-CM | POA: Insufficient documentation

## 2021-04-08 DIAGNOSIS — F419 Anxiety disorder, unspecified: Secondary | ICD-10-CM | POA: Insufficient documentation

## 2021-04-08 DIAGNOSIS — E871 Hypo-osmolality and hyponatremia: Secondary | ICD-10-CM | POA: Insufficient documentation

## 2021-04-08 DIAGNOSIS — Z85828 Personal history of other malignant neoplasm of skin: Secondary | ICD-10-CM | POA: Insufficient documentation

## 2021-04-08 LAB — CBC
HCT: 43.3 % (ref 36.0–46.0)
Hemoglobin: 15.5 g/dL — ABNORMAL HIGH (ref 12.0–15.0)
MCH: 33.3 pg (ref 26.0–34.0)
MCHC: 35.8 g/dL (ref 30.0–36.0)
MCV: 92.9 fL (ref 80.0–100.0)
Platelets: 341 10*3/uL (ref 150–400)
RBC: 4.66 MIL/uL (ref 3.87–5.11)
RDW: 12.9 % (ref 11.5–15.5)
WBC: 8.9 10*3/uL (ref 4.0–10.5)
nRBC: 0 % (ref 0.0–0.2)

## 2021-04-08 LAB — BASIC METABOLIC PANEL
Anion gap: 13 (ref 5–15)
BUN: 14 mg/dL (ref 8–23)
CO2: 18 mmol/L — ABNORMAL LOW (ref 22–32)
Calcium: 9.8 mg/dL (ref 8.9–10.3)
Chloride: 94 mmol/L — ABNORMAL LOW (ref 98–111)
Creatinine, Ser: 1.29 mg/dL — ABNORMAL HIGH (ref 0.44–1.00)
GFR, Estimated: 45 mL/min — ABNORMAL LOW (ref >60)
Glucose, Bld: 190 mg/dL — ABNORMAL HIGH (ref 70–99)
Potassium: 4 mmol/L (ref 3.5–5.1)
Sodium: 125 mmol/L — ABNORMAL LOW (ref 135–145)

## 2021-04-08 LAB — TROPONIN I (HIGH SENSITIVITY): Troponin I (High Sensitivity): 9 ng/L (ref <18)

## 2021-04-08 NOTE — ED Provider Notes (Signed)
[facility] Emergency Department Provider Note ____________________________________________   Event Date/Time   First MD Initiated Contact with Patient 04/08/21 1344     (approximate)  I have reviewed the triage vital signs and the nursing notes.  HISTORY  Chief Complaint Hypertension and Chest Pain   HPI Diane Gomez is a 65 y.o. femalewho presents to the ED for evaluation of chronic hypertension.  Chart review indicates she hypertension, following with cardiology and seeing them just 1 month ago where her long-acting diltiazem was increased to 240 mg..  Chronic and mild hyponatremia. Takes diltiazem ER and Toprol-XL daily.  Patient presents to the ED because "I cannot get my blood pressure down."  She is primarily concerned about her asymptomatic hypertension at home.  She reports checking 3-5 times per day every day, these numbers often increasing with a check as she becomes increasingly anxious that she has elevated blood pressure that are causing her CKD to worsen.  She reports systolic BPs 161-096.  She reports compliance with her medications and her recent diltiazem increased.  She reports a "twinge" of brief chest pain that lasted a matter of seconds before self resolving yesterday and this morning while she was seated.  She minimizes this and indicates that she has no chest pain right now.  History of anxiety on alprazolam as needed.  Past Medical History:  Diagnosis Date   Allergy    Alpha galactosidase deficiency    Anemia    Anxiety    Asthma    from cats and horses only   Cancer ([place]) 15 years ago   melanoma    Fibroid    SUBMUCOUS MYOMA   GERD (gastroesophageal reflux disease)    Heart murmur    Hypertension    Osteoporosis 06/2017   T score -2.6   Tumors    liver    Patient Active Problem List   Diagnosis Date Noted   Mixed incontinence 04/04/2020   Alcohol abuse 04/04/2020   Anxiety 12/12/2016   Allergy-induced asthma  12/12/2016   Gastroesophageal reflux disease 06/27/2014   Hypertension    Osteoporosis     Past Surgical History:  Procedure Laterality Date   CESAREAN SECTION     ENDOMETRIAL ABLATION  2009   HER OPTION   HYSTEROSCOPY  2008   MYOMECTOMY   TUBAL LIGATION      Prior to Admission medications   Medication Sig Start Date End Date Taking? Authorizing Provider  albuterol (VENTOLIN HFA) 108 (90 Base) MCG/ACT inhaler Inhale 2 puffs into the lungs every 6 (six) hours as needed for wheezing or shortness of breath. 02/20/21   Kennyth Arnold, FNP  ALPRAZolam Duanne Moron) 0.5 MG tablet Take 0.5 mg by mouth at bedtime as needed for anxiety.    [provider]  diltiazem (TIAZAC) 300 MG 24 hr capsule Take 180 mg by mouth daily.    [provider]  EPINEPHrine 0.3 mg/0.3 mL IJ SOAJ injection Inject 0.3 mg into the muscle as needed for anaphylaxis. If this medication is used call 911 to be taken to nearest hospital 02/22/21   Michela Pitcher, NP  esomeprazole (NEXIUM) 20 MG capsule Take 1 capsule (20 mg total) by mouth every other day.    [provider]  glycopyrrolate (ROBINUL) 1 MG tablet Take 1 tablet (1 mg total) by mouth 2 (two) times daily as needed. Abdominal pain or cramps 01/19/21   Mauri Pole, MD  metoprolol succinate (TOPROL-XL) 50 MG 24 hr  tablet Take 1 tablet by mouth daily.  04/18/14   [provider]  Probiotic Product (PROBIOTIC-10 PO) Take by mouth.    [provider]    Allergies Ondansetron, Penicillins, Brimonidine, Meat [alpha-gal], Other, and Shellfish allergy  Family History  Problem Relation Age of Onset   Hypertension Mother    Parkinsonism Sister        PARKINSON'S DISEASE   Breast cancer Other        Niece  Age 80   Aneurysm Father    Seizures Sister    Heart disease Brother    Colon cancer Neg Hx    Stomach cancer Neg Hx    Esophageal cancer Neg Hx     Social History Social History   Tobacco Use   Smoking  status: Former    Types: Cigarettes   Smokeless tobacco: Never  Vaping Use   Vaping Use: Never used  Substance Use Topics   Alcohol use: Yes    Alcohol/week: 14.0 standard drinks    Types: 7 Shots of liquor, 7 Standard drinks or equivalent per week    Comment: moderate..."at least one drink per day" Vodka per pt   Drug use: Yes    Frequency: 2.0 times per week    Types: Marijuana    Comment: occasional     Review of Systems  Constitutional: No fever/chills Eyes: No visual changes. ENT: No sore throat. Cardiovascular: Positive for hypertension and chest pain. Respiratory: Denies shortness of breath. Gastrointestinal: No abdominal pain.  No nausea, no vomiting.  No diarrhea.  No constipation. Genitourinary: Negative for dysuria. Musculoskeletal: Negative for back pain. Skin: Negative for rash. Neurological: Negative for headaches, focal weakness or numbness.  ____________________________________________   PHYSICAL EXAM:  VITAL SIGNS: Vitals:   04/08/21 1250  BP: (!) 162/93  Pulse: 77  Resp: 20  Temp: 98.4 F (36.9 C)  SpO2: 96%     Constitutional: Alert and oriented. Well appearing and in no acute distress. Eyes: Conjunctivae are normal. PERRL. EOMI. Head: Atraumatic. Nose: No congestion/rhinnorhea. Mouth/Throat: Mucous membranes are moist.  Oropharynx non-erythematous. Neck: No stridor. No cervical spine tenderness to palpation. Cardiovascular: Normal rate, regular rhythm. Grossly normal heart sounds.  Good peripheral circulation. Respiratory: Normal respiratory effort.  No retractions. Lungs CTAB. Gastrointestinal: Soft , nondistended, nontender to palpation. No CVA tenderness. Musculoskeletal: No lower extremity tenderness nor edema.  No joint effusions. No signs of acute trauma. Neurologic:  Normal speech and language. No gross focal neurologic deficits are appreciated. No gait instability noted. Skin:  Skin is warm, dry and intact. No rash  noted. Psychiatric: Mood and affect are anxious. Speech and behavior are normal.  ____________________________________________   LABS (all labs ordered are listed, but only abnormal results are displayed)  Labs Reviewed  BASIC METABOLIC PANEL - Abnormal; Notable for the following components:      Result Value   Sodium 125 (*)    Chloride 94 (*)    CO2 18 (*)    Glucose, Bld 190 (*)    Creatinine, Ser 1.29 (*)    GFR, Estimated 45 (*)    All other components within normal limits  CBC - Abnormal; Notable for the following components:   Hemoglobin 15.5 (*)    All other components within normal limits  TROPONIN I (HIGH SENSITIVITY)  TROPONIN I (HIGH SENSITIVITY)   ____________________________________________  12 Lead EKG  Sinus rhythm, and of 68 bpm.  Normal axis and intervals.  No evidence of acute ischemia.  Some stigmata  of LVH. ____________________________________________  RADIOLOGY  ED MD interpretation:  CXR reviewed by me without evidence of acute cardiopulmonary pathology.  Official radiology report(s): DG Chest 2 View  Result Date: 04/08/2021 CLINICAL DATA:  Chest pain EXAM: CHEST - 2 VIEW COMPARISON:  Chest x-ray dated May 07, 2020 FINDINGS: The heart size and mediastinal contours are within normal limits. Both lungs are clear. The visualized skeletal structures are unremarkable. IMPRESSION: No active cardiopulmonary disease. Electronically Signed   By: Yetta Glassman M.D.   On: 04/08/2021 13:47    ____________________________________________   PROCEDURES and INTERVENTIONS  Procedure(s) performed (including Critical Care):  .1-3 Lead EKG Interpretation Performed by: Vladimir Crofts, MD Authorized by: Vladimir Crofts, MD     Interpretation: normal     ECG rate:  70   ECG rate assessment: normal     Rhythm: sinus rhythm     Ectopy: none     Conduction: normal    Medications - No data to display  ____________________________________________   MDM /  ED COURSE   65 year old female presents to the ED with complaints of asymptomatic hypertension and amenable to outpatient management.  Her brief episode of chest pain that occurred overnight and this morning were unrelated to her blood pressure she says and she really minimizes this.  Atypical chest pains and without evidence of ACS on her work-up.  Is noted to be hyponatremic, but she frequently has sodiums in this range and it is also asymptomatic.  I suspect to large degree of anxiety contributing to her symptoms.  We discussed management at home, following up with her PCP and cardiologist and return precautions for the ED.     ____________________________________________   FINAL CLINICAL IMPRESSION(S) / ED DIAGNOSES  Final diagnoses:  Primary hypertension  Other chest pain  Anxiety     ED Discharge Orders     None        Savaya Hakes Tamala Julian   Note:  This document was prepared using Dragon voice recognition software and may include unintentional dictation errors.    Vladimir Crofts, MD 04/08/21 [id number]

## 2021-04-08 NOTE — ED Notes (Signed)
Dc ppw provided. Pt followup information provided. Pt verbal consent for dc given. Pt assited off unit on foot.

## 2021-04-08 NOTE — Discharge Instructions (Signed)
Follow-up with your PCP to have a recheck of your sodium level.  Check your blood pressure less frequently at home.  Check a few times per week when you are in your normal routines and feeling well at home.  I suspect a degree of anxiety is contributing to your elevated blood pressure.  Continue your Tiazac medication and follow-up with Dr. Saralyn Pilar to discuss going up on this medication.  As we discussed, I would urge you to start exercising, even just going on walks, to help with your blood pressure, anxiety and sleeping at night.  Small amounts of aerobic exercise to be the most helpful thing that you can do for all of these things.  Return to the ED with any worsening symptoms.

## 2021-04-08 NOTE — ED Triage Notes (Signed)
Pt in via EMS from home with c/o HTN uncontrolled for last 2 months. Pt saw PMD a week ago and her meds were changed but no change in BP. 170/100, HR 90, 98% RA, CBG 155, 98.7 temp. Pt tested + for covid on the 10th.

## 2021-04-08 NOTE — ED Triage Notes (Signed)
Pt reports not able to control her BP, states this issue has been ongoing for a year and her meds were just changed. Pt reports some CP as well this am.

## 2021-04-13 ENCOUNTER — Encounter: Payer: Self-pay | Admitting: Gastroenterology

## 2021-04-13 ENCOUNTER — Ambulatory Visit: Payer: Medicare Other | Admitting: Gastroenterology

## 2021-04-13 VITALS — BP 166/76 | HR 84 | Wt 120.0 lb

## 2021-04-13 DIAGNOSIS — K580 Irritable bowel syndrome with diarrhea: Secondary | ICD-10-CM

## 2021-04-13 NOTE — Patient Instructions (Signed)
Take Budesonide 3 mg every other day to complete the prescription then stop  Use Robinul as needed  If you are age 67 or older, your body mass index should be between 23-30. Your Body mass index is 22.67 kg/m. If this is out of the aforementioned range listed, please consider follow up with your Primary Care Provider.  If you are age 68 or younger, your body mass index should be between 19-25. Your Body mass index is 22.67 kg/m. If this is out of the aformentioned range listed, please consider follow up with your Primary Care Provider.   ________________________________________________________  The [facility] providers would like to encourage you to use [facility] to communicate with providers for non-urgent requests or questions.  Due to long hold times on the telephone, sending your provider a message by [facility] may be a faster and more efficient way to get a response.  Please allow 48 business hours for a response.  Please remember that this is for non-urgent requests.  _______________________________________________________   I appreciate the  opportunity to care for you  Thank You   Harl Bowie , MD

## 2021-04-13 NOTE — Progress Notes (Signed)
Diane Gomez    [id number]    Jan 27, 1956  Primary Care Physician:Baity, Coralie Keens, NP  Referring Physician: Jearld Fenton, NP [place],  [place] [id number]   Chief complaint:  Diarrhea  HPI:  65 year old very pleasant female here with complaints of chronic diarrhea.   Overall she feels diarrhea has improved with daily budesonide, she is having formed 1-2 bowel movements daily, she is experiencing intermittent constipation and has not had a bowel movement in the past 2 to 3 days. Denies any rectal bleeding.    She was sent to ER with severe hyponatremia, sodium 125.  She reports she is no longer drinking alcohol.    Patient is upset about chronic kidney disease and electrolyte abnormality   Colonoscopy with biopsies negative for microscopic colitis, had mild ischemic colitis changes.  She was empirically started on budesonide 9 mg daily, has history of alpha gal allergy.  She is avoiding red meat.  She was also lactose intolerant, loves cheese and given her diet is already restricted she finds it hard to avoid milk or dairy products.   Colonoscopy 12/21/2020 - The perianal and digital rectal examinations were normal. - A few <five mm ulcers were found in the sigmoid colon. No bleeding was present. Biopsies were taken with a cold forceps for histology. - Scattered small and large-mouthed diverticula were found in the sigmoid colon and descending colon. There was narrowing of the colon in association with the diverticular opening. There was evidence of diverticular spasm. Peri-diverticular erythema was seen. - Segmental and patchy mild inflammation characterized by altered vascularity, congestion (edema), erosions, erythema, mucus and shallow ulcerations was found in the sigmoid colon and in the descending colon. Biopsies were taken with a cold forceps for histology. - The terminal ileum appeared normal. Biopsies were taken with a cold forceps for  histology. Biopsies for histology were taken with a cold forceps from the right colon and left colon for evaluation of microscopic colitis. - Non-bleeding external and internal hemorrhoids were found during retroflexion. The hemorrhoids were small.   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   Ongoing EtOH use.  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 With Recommendation for Pelvic floor retraining with continence strategies and correction of dyssynergic defecation. Pelvic heath therapy referral was placed. Unfortunately, she states that [facility] no longer accepts her insurance.     Colonoscopy December 11, 2016 - The perianal and digital rectal examinations were normal. - A 5 mm polyp was found in the transverse colon. The polyp was sessile. The polyp was removed with a cold snare. Resection and retrieval were complete.--Tubular adenoma - A 10 mm polyp was found in the sigmoid colon. The polyp was sessile. The  polyp was removed with a hot snare. Resection and retrieval were complete.---Hyperplastic - Multiple small and large-mouthed diverticula were found in the sigmoid colon and descending colon. - Non-bleeding internal hemorrhoids were found during retroflexion. The hemorrhoids were Small. Recommended  recall colonoscopy in 5 years   EGD April 13, 2017: Unremarkable   MRI abdomen with and without contrast May 01, 2017 [place] atherosclerosis otherwise benign lesions noted bilateral kidney, adrenal and in the liver.  Hepatic steatosis. No further imaging was recommended for follow-up.   CT abdomen pelvis April 22, 2017 1. There is a new abnormal 2.6 cm hypodense lesion in segment 8 of the liver. Possibilities might include focal fatty infiltration, unusual hemangioma, or malignancy. Hepatic protocol MRI is recommended for further workup. 2. Small lesion in segment 6 of the liver was probably present in 2008 but obscured by streak artifact. Long-term persistence indicates a likely benign lesion. 3. Nonspecific 1.3 cm nodule in the left adrenal gland. This could also be further assessed at the time of MRI. 4. 6 mm left mid kidney calculus within a small calyceal diverticulum below a region of renal scarring. 5. New or enlarged 1.1 by 0.8 cm hypodense lesion in the left mid to lower kidney, probably a cyst but technically nonspecific due to small size. This also could be further characterized at the time of MRI. 6. Scattered descending and sigmoid colon diverticula without active diverticulitis. 7.  Aortic Atherosclerosis    Outpatient Encounter Medications as of 04/13/2021  Medication Sig   albuterol (VENTOLIN HFA) 108 (90 Base) MCG/ACT inhaler Inhale 2 puffs into the lungs every 6 (six) hours as needed for wheezing or shortness of breath.   ALPRAZolam (XANAX) 0.5 MG tablet Take 0.5 mg by mouth at bedtime as needed for anxiety.   budesonide (ENTOCORT EC) 3 MG 24 hr capsule Take by mouth daily.   diltiazem (TIAZAC) 300 MG 24 hr capsule Take 180 mg by mouth daily.   esomeprazole (NEXIUM) 20 MG capsule Take 1 capsule (20 mg total) by mouth every other day.   glycopyrrolate (ROBINUL) 1 MG tablet Take 1 tablet (1 mg total) by mouth 2 (two) times daily as needed. Abdominal pain  or cramps   metoprolol succinate (TOPROL-XL) 50 MG 24 hr tablet Take 1 tablet by mouth daily.    Probiotic Product (PROBIOTIC-10 PO) Take by mouth.   [DISCONTINUED] EPINEPHrine 0.3 mg/0.3 mL IJ SOAJ injection Inject 0.3 mg into the muscle as needed for anaphylaxis. If this medication is used call 911 to be taken to nearest hospital   No facility-administered encounter medications on file as of 04/13/2021.    Allergies as of 04/13/2021 - Review Complete 04/08/2021  Allergen Reaction Noted   Ondansetron  12/11/2017   Penicillins Hives 10/04/2010   Brimonidine  05/07/2018   Meat [alpha-gal]  12/03/2018   Other  12/11/2016   Shellfish allergy  12/03/2018    Past Medical History:  Diagnosis Date   Allergy    Alpha galactosidase deficiency    Anemia    Anxiety    Asthma    from cats and horses only   Cancer ([place]) 15 years ago   melanoma    Fibroid    SUBMUCOUS MYOMA   GERD (gastroesophageal reflux disease)    Heart murmur    Hypertension    Osteoporosis 06/2017   T score -2.6   Tumors    liver    Past Surgical History:  Procedure Laterality Date   CESAREAN SECTION  ENDOMETRIAL ABLATION  2009   HER OPTION   HYSTEROSCOPY  2008   MYOMECTOMY   TUBAL LIGATION      Family History  Problem Relation Age of Onset   Hypertension Mother    Parkinsonism Sister        PARKINSON'S DISEASE   Breast cancer Other        Niece  Age 28   Aneurysm Father    Seizures Sister    Heart disease Brother    Colon cancer Neg Hx    Stomach cancer Neg Hx    Esophageal cancer Neg Hx     Social History   Socioeconomic History   Marital status: Divorced    Spouse name: Not on file   Number of children: Not on file   Years of education: Not on file   Highest education level: Not on file  Occupational History   Not on file  Tobacco Use   Smoking status: Former    Types: Cigarettes   Smokeless tobacco: Never  Vaping Use   Vaping Use: Never used  Substance and Sexual Activity    Alcohol use: Yes    Alcohol/week: 14.0 standard drinks    Types: 7 Shots of liquor, 7 Standard drinks or equivalent per week    Comment: moderate..."at least one drink per day" Vodka per pt   Drug use: Yes    Frequency: 2.0 times per week    Types: Marijuana    Comment: occasional    Sexual activity: Not Currently    Birth control/protection: Post-menopausal, Surgical  Other Topics Concern   Not on file  Social History Narrative   Not on file   Social Determinants of Health   Financial Resource Strain: Not on file  Food Insecurity: Not on file  Transportation Needs: Not on file  Physical Activity: Not on file  Stress: Not on file  Social Connections: Not on file  Intimate Partner Violence: Not on file      Review of systems: All other review of systems negative except as mentioned in the HPI.   Physical Exam: Vitals:   04/13/21 1102  BP: (!) 166/76  Pulse: 84   Body mass index is 22.67 kg/m. 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 She is very reluctant to taper off budesonide, she feels it is the only medication that is helping with her symptoms.  Patient agreed to come off  budesonide once she has completed the current prescription   Use Robinul 1 mg twice daily as needed for abdominal cramping, discomfort, bloating or diarrhea   She has elevated alpha gal antibody, advised patient to continue to avoid red meat .   Severe hyponatremia: Likely secondary to  excessive alcohol use (Beer potomania) Discussed alcohol cessation Managed by nephrology   Chronic kidney disease: Managed by nephrology   She has history of adrenal nodule followed by nephrology and PMD    Return in 3 months  This visit required 41 minutes of patient care (this includes precharting, chart review, review of results, face-to-face time used for counseling as well as treatment plan and follow-up. The patient was provided an opportunity to ask questions and all were answered. The patient agreed with the plan and demonstrated an understanding of the instructions.  Damaris Hippo , MD    CC: Jearld Fenton, NP

## 2021-04-17 ENCOUNTER — Encounter: Payer: Self-pay | Admitting: Family

## 2021-04-17 ENCOUNTER — Other Ambulatory Visit: Payer: Self-pay

## 2021-04-17 ENCOUNTER — Ambulatory Visit (INDEPENDENT_AMBULATORY_CARE_PROVIDER_SITE_OTHER): Payer: Medicare Other | Admitting: Family

## 2021-04-17 VITALS — BP 154/98 | HR 81 | Temp 97.8°F | Ht 61.0 in | Wt 119.0 lb

## 2021-04-17 DIAGNOSIS — I10 Essential (primary) hypertension: Secondary | ICD-10-CM

## 2021-04-17 DIAGNOSIS — R001 Bradycardia, unspecified: Secondary | ICD-10-CM

## 2021-04-17 DIAGNOSIS — S81811A Laceration without foreign body, right lower leg, initial encounter: Secondary | ICD-10-CM | POA: Diagnosis not present

## 2021-04-17 DIAGNOSIS — S81811D Laceration without foreign body, right lower leg, subsequent encounter: Secondary | ICD-10-CM | POA: Insufficient documentation

## 2021-04-17 DIAGNOSIS — K559 Vascular disorder of intestine, unspecified: Secondary | ICD-10-CM | POA: Diagnosis not present

## 2021-04-17 DIAGNOSIS — F1011 Alcohol abuse, in remission: Secondary | ICD-10-CM

## 2021-04-17 DIAGNOSIS — N1831 Chronic kidney disease, stage 3a: Secondary | ICD-10-CM

## 2021-04-17 DIAGNOSIS — R739 Hyperglycemia, unspecified: Secondary | ICD-10-CM

## 2021-04-17 DIAGNOSIS — E871 Hypo-osmolality and hyponatremia: Secondary | ICD-10-CM

## 2021-04-17 DIAGNOSIS — Z91018 Allergy to other foods: Secondary | ICD-10-CM

## 2021-04-17 DIAGNOSIS — K580 Irritable bowel syndrome with diarrhea: Secondary | ICD-10-CM

## 2021-04-17 DIAGNOSIS — F419 Anxiety disorder, unspecified: Secondary | ICD-10-CM

## 2021-04-17 DIAGNOSIS — K219 Gastro-esophageal reflux disease without esophagitis: Secondary | ICD-10-CM

## 2021-04-17 LAB — POCT GLYCOSYLATED HEMOGLOBIN (HGB A1C): Hemoglobin A1C: 5.6 % (ref 4.0–5.6)

## 2021-04-17 MED ORDER — MUPIROCIN CALCIUM 2 % EX CREA: 1.0000 "application " | TOPICAL_CREAM | Freq: Two times a day (BID) | CUTANEOUS | 0 refills | Status: DC

## 2021-04-17 NOTE — Patient Instructions (Addendum)
Start monitoring your blood pressure daily, around the same time of day, for the next 2-3 weeks.  Ensure that you have rested for 30 minutes prior to checking your blood pressure. Record your readings and bring them to your next visit to your cardiologist.   Patient advised to follow-up with a gastroenterologist for ongoing management of their alpha gal as well as their ischemic colitis.   We did discuss prescription for mammogram however patient refuses.  Patient states that she will think about it and will reach out if she would like a referral.  For low sodium please follow up with nephrologist for further evaluation and treatment.  Continue not to drink any alcohol.  For your leg laceration clean with water and mild soap daily as well as to apply mupirocin ointment that has been prescribed twice daily to your pharmacy.  Patient to follow-up with any worsening symptoms and or signs of symptoms of infection (redness, swelling, colorful discharge)  It was a pleasure seeing you today! Please do not hesitate to reach out with any questions and or concerns.  Regards,   Eugenia Pancoast

## 2021-04-17 NOTE — Progress Notes (Addendum)
Subjective:     Patient ID: Diane Gomez, female    DOB: 1956-02-01, 65 y.o.   MRN: [id number]  Chief Complaint  Patient presents with   Transitions Of Care    HPI Patient is in today for transfer of care visit. Prior PCP Regina left to go to another agency.  Acute concerns: laceration from car door yesterday in the afternoon. Opened superficial layer of skin with some bleeding. She has applied neosporin and hydrogen peroxide with some relief. She denies fever and or chills. Last tetanus was on 05/11/14  Hyponatremia: went to ER, sodium was 125. Has f/u with her nephrologist next week.pt denies n/v/drowsiness. Neph Dr. Lanora Manis. Left renal cyst benign in nature per nephrologist. H/o alcohol abuse contributing to hyponatremia initially. Nephrotic protenuria  Etoh abuse: pt reports complete alcohol cessation November 24, 2020.   CKD stage 3a: followed by neph f/u is next week.  HTN: followed by cardiologist, recent increase diltiazem by cardiologist to 240 mg once daily. She is also taking toprol XL QD 50 mg.  Denies chest pain palpitations and/or shortness of breath  Liver lesion: CT initially in 2018, new liver lesion was found. MRI abd w/ & w/o showed more accurate measures 1.3 cm and appearance was benign requiring no further workup. Small lesion on liver compatible with cyst.  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  Patient avoids mammalian meats.  She is requesting a refill on her EpiPen that was filled 4 years ago in case of alpha gal allergy.    Health Maintenance Due  Topic Date Due   Hepatitis C Screening  Never done   COVID-19 Vaccine (3 - Pfizer risk series) 06/07/2020    Past Medical History:  Diagnosis Date   Alcohol abuse, uncomplicated 7/67/3419   Allergy    Alpha galactosidase deficiency    Anemia    Anxiety    Asthma    from cats and horses only   Cancer ([place]) 15 years ago   melanoma    Fibroid    SUBMUCOUS  MYOMA   GERD (gastroesophageal reflux disease)    Heart murmur    History of nonmelanoma skin cancer 06/27/2014   Formatting of this note might be different from the original. Formatting of this note may be different from the original. Skin Cancer History- Non-Melanoma Skin Cancer  Diagnosis Location Biopsy Date Treatment date Procedure Surgeon  [facility] Left temple, scalp  2008 ED&C [place]  [facility] Left temple  2010    [facility] Left neck  2001    [place]  Right upper lip  2007 excision   [facility]  L upper chest  2007 ED&C   [facility]   Hypertension    Osteoporosis 06/2017   T score -2.6   Tumors    liver    Past Surgical History:  Procedure Laterality Date   CESAREAN SECTION     ENDOMETRIAL ABLATION  2009   HER OPTION   HYSTEROSCOPY  2008   MYOMECTOMY   TUBAL LIGATION      Family History  Problem Relation Age of Onset   Hypertension Mother    Parkinsonism Sister        PARKINSON'S DISEASE   Breast cancer Other        Niece  Age 69   Aneurysm Father    Seizures Sister    Heart disease Brother    Colon cancer Neg Hx    Stomach cancer Neg Hx  Esophageal cancer Neg Hx     Social History   Socioeconomic History   Marital status: Divorced    Spouse name: Not on file   Number of children: Not on file   Years of education: Not on file   Highest education level: Not on file  Occupational History   Not on file  Tobacco Use   Smoking status: Former    Types: Cigarettes   Smokeless tobacco: Never  Vaping Use   Vaping Use: Never used  Substance and Sexual Activity   Alcohol use: Yes    Alcohol/week: 14.0 standard drinks    Types: 7 Shots of liquor, 7 Standard drinks or equivalent per week    Comment: moderate..."at least one drink per day" Vodka per pt   Drug use: Yes    Frequency: 2.0 times per week    Types: Marijuana    Comment: occasional    Sexual activity: Not Currently    Birth control/protection: Post-menopausal, Surgical  Other Topics Concern   Not on file  Social History  Narrative   Not on file   Social Determinants of Health   Financial Resource Strain: Not on file  Food Insecurity: Not on file  Transportation Needs: Not on file  Physical Activity: Not on file  Stress: Not on file  Social Connections: Not on file  Intimate Partner Violence: Not on file    Outpatient Medications Prior to Visit  Medication Sig Dispense Refill   albuterol (VENTOLIN HFA) 108 (90 Base) MCG/ACT inhaler Inhale 2 puffs into the lungs every 6 (six) hours as needed for wheezing or shortness of breath. 18 g 0   ALPRAZolam (XANAX) 0.5 MG tablet Take 0.5 mg by mouth at bedtime as needed for anxiety.     budesonide (ENTOCORT EC) 3 MG 24 hr capsule Take by mouth daily.     diltiazem (TIAZAC) 300 MG 24 hr capsule Take 180 mg by mouth daily.     esomeprazole (NEXIUM) 20 MG capsule Take 1 capsule (20 mg total) by mouth every other day.     metoprolol succinate (TOPROL-XL) 50 MG 24 hr tablet Take 1 tablet by mouth daily.      Probiotic Product (PROBIOTIC-10 PO) Take by mouth.     glycopyrrolate (ROBINUL) 1 MG tablet Take 1 tablet (1 mg total) by mouth 2 (two) times daily as needed. Abdominal pain or cramps (Patient not taking: Reported on 04/17/2021) 60 tablet 1   No facility-administered medications prior to visit.    Allergies  Allergen Reactions   Ondansetron     Bilateral eye swelling without breathing changes    Penicillins Hives   Brimonidine     Bit by a tick    Meat [Alpha-Gal]    Other     Cats and horses   Shellfish Allergy     Review of Systems  Constitutional:  Negative for chills and fever.  HENT:  Negative for ear pain and sore throat.   Respiratory:  Negative for cough.   Cardiovascular:  Negative for chest pain.  Gastrointestinal:  Negative for abdominal pain, blood in stool, diarrhea, heartburn, nausea and vomiting.  Skin:  Positive for rash (skin laceration right anterior shin with drying blood, no drainage).      Objective:    Physical  Exam Constitutional:      General: She is not in acute distress.    Appearance: Normal appearance. She is normal weight. She is not ill-appearing.  HENT:     Head: Normocephalic.  Right Ear: Tympanic membrane normal.     Left Ear: Tympanic membrane normal.     Nose: Nose normal.     Mouth/Throat:     Mouth: Mucous membranes are moist.  Cardiovascular:     Rate and Rhythm: Normal rate and regular rhythm.  Pulmonary:     Effort: Pulmonary effort is normal.  Abdominal:     General: Abdomen is flat. Bowel sounds are normal.     Palpations: Abdomen is soft.     Tenderness: There is no abdominal tenderness.  Musculoskeletal:        General: No swelling or tenderness.     Cervical back: Normal range of motion.     Right lower leg: No edema.     Left lower leg: No edema.  Skin:    General: Skin is warm.     Capillary Refill: Capillary refill takes less than 2 seconds.     Findings: Lesion (skin laceration superfical layer right anterior shin, new epithelial budding, no drainage, approx 3x3 cm diameter) present.     Comments: The wound is cleansed, debrided of foreign material as much as possible, and dressed. The patient is alerted to watch for any signs of infection (redness, pus, pain, increased swelling or fever) and call if such occurs. Home wound care instructions are provided. Tetanus vaccination status reviewed: last tetanus booster within 10 years   Neurological:     General: No focal deficit present.     Mental Status: She is alert and oriented to person, place, and time. Mental status is at baseline.  Psychiatric:        Mood and Affect: Mood normal.        Behavior: Behavior normal.        Thought Content: Thought content normal.        Judgment: Judgment normal.    BP (!) 154/98   Pulse 81   Temp 97.8 F (36.6 C) (Temporal)   Ht 5\' 1"  (1.549 m)   Wt 119 lb (54 kg)   SpO2 98%   BMI 22.48 kg/m  Wt Readings from Last 3 Encounters:  04/17/21 119 lb (54 kg)   04/13/21 120 lb (54.4 kg)  04/08/21 107 lb (48.5 kg)       Assessment & Plan:   Problem List Items Addressed This Visit       Cardiovascular and Mediastinum   Hypertension    Start monitoring your blood pressure daily, around the same time of day, for the next 2-3 weeks.  Ensure that you have rested for 30 minutes prior to checking your blood pressure. Record your readings and bring them to your next visit with your cardiologist.       Sinus bradycardia    Continue follow-up and management with the cardiologist as scheduled.  Stable        Digestive   Gastroesophageal reflux disease    Continue Nexium as prescribed.  Pt instructed to decrease spicy, fried fatty foods as well as decreased chocolate and caffeine. Instructed to f/u if no improvement of symptoms.       Colitis, ischemic ([place])    Followed by GI last colonoscopy 12/21/2020.      Irritable bowel syndrome with diarrhea    Discussed FODMAP diet with patient f/u with GI as necessary.        Genitourinary   Chronic kidney disease, stage 3a ([place])    Patient to continue to follow-up with nephrologist as scheduled  Other   Anxiety    Discussed with patient if she is to take alprazolam do not take with any alcohol and/or other sedative medications/pain medications. Discussed complementary complementary methods on controlling anxiety as well as breathing exercises.      Leg laceration, right, initial encounter - Primary    Patient to continue to monitor site for signs and symptoms of infection if any fever and/or increasing drainage yellow or green and/or increasing redness around the site please follow-up immediately.  Mupirocin ointment sent to pharmacy please start and use twice daily on the site.  Patient advised to use nonadherent dressing when out and about and to secure with Kerlix.  Cleanse site daily with water and a gentle soap.      Relevant Medications   mupirocin cream (BACTROBAN) 2 %   Allergy  to alpha-gal    Continue to avoid meats from mammals.  Discussed with patient why epinephrine(EpiPen (would be contraindications for patient as she is allergic to alpha gal proteins and epinephrine comes from the adrenal glands of a mammal nso she could potentially be allergic to that as well.  Advised patient if she is to have any allergic symptoms that are not anaphylactic to take a Benadryl ; however, if anaphylactic immediately call 911 and or go to the emergency room.  Continue follow-up with GI as necessary.      Alcohol use disorder, mild, in early remission, abuse    Discussed with patient to continue to avoid alcohol.      High blood sugar    A1c was ordered today patient is not prediabetic.  Continue to exercise as tolerated.      Relevant Orders   POCT glycosylated hemoglobin (Hb A1C) (Completed)   RESOLVED: Hyponatremia    I have discontinued [name]. Bordner's glycopyrrolate. I am also having her start on mupirocin cream. Additionally, I am having her maintain her diltiazem, esomeprazole, metoprolol succinate, Probiotic Product (PROBIOTIC-10 PO), ALPRAZolam, albuterol, and budesonide.  Meds ordered this encounter  Medications   mupirocin cream (BACTROBAN) 2 %    Sig: Apply 1 application topically 2 (two) times daily.    Dispense:  14 g    Refill:  0    Order Specific Question:   Supervising Provider    Answer:   BEDSOLE, AMY E [[id number]]   Time spent with this patient was approximately 50 minutes face to face, and another 30 minutes spent reviewing records, and past doctor consults, labs, past history, and allergy related information.

## 2021-04-18 ENCOUNTER — Encounter: Payer: Self-pay | Admitting: Family

## 2021-04-18 DIAGNOSIS — F1011 Alcohol abuse, in remission: Secondary | ICD-10-CM | POA: Insufficient documentation

## 2021-04-18 DIAGNOSIS — R739 Hyperglycemia, unspecified: Secondary | ICD-10-CM | POA: Insufficient documentation

## 2021-04-18 NOTE — Assessment & Plan Note (Signed)
Patient to follow-up with nephrologist as scheduled continue avoiding alcohol use.

## 2021-04-18 NOTE — Assessment & Plan Note (Signed)
Start monitoring your blood pressure daily, around the same time of day, for the next 2-3 weeks.  Ensure that you have rested for 30 minutes prior to checking your blood pressure. Record your readings and bring them to your next visit with your cardiologist.

## 2021-04-18 NOTE — Assessment & Plan Note (Signed)
Continue follow-up and management with the cardiologist as scheduled.  Stable

## 2021-04-18 NOTE — Assessment & Plan Note (Signed)
Continue Nexium as prescribed.  Pt instructed to decrease spicy, fried fatty foods as well as decreased chocolate and caffeine. Instructed to f/u if no improvement of symptoms.

## 2021-04-18 NOTE — Assessment & Plan Note (Deleted)
Patient to follow-up with nephrologist as scheduled continue avoiding alcohol use.

## 2021-04-18 NOTE — Assessment & Plan Note (Signed)
Discussed with patient if she is to take alprazolam do not take with any alcohol and/or other sedative medications/pain medications. Discussed complementary complementary methods on controlling anxiety as well as breathing exercises.

## 2021-04-18 NOTE — Assessment & Plan Note (Signed)
Patient to continue to monitor site for signs and symptoms of infection if any fever and/or increasing drainage yellow or green and/or increasing redness around the site please follow-up immediately.  Mupirocin ointment sent to pharmacy please start and use twice daily on the site.  Patient advised to use nonadherent dressing when out and about and to secure with Kerlix.  Cleanse site daily with water and a gentle soap.

## 2021-04-18 NOTE — Assessment & Plan Note (Signed)
Discussed with patient to continue to avoid alcohol.

## 2021-04-18 NOTE — Assessment & Plan Note (Signed)
Continue to avoid meats from mammals.  Discussed with patient why epinephrine(EpiPen (would be contraindications for patient as she is allergic to alpha gal proteins and epinephrine comes from the adrenal glands of a mammal nso she could potentially be allergic to that as well.  Advised patient if she is to have any allergic symptoms that are not anaphylactic to take a Benadryl ; however, if anaphylactic immediately call 911 and or go to the emergency room.  Continue follow-up with GI as necessary.

## 2021-04-18 NOTE — Assessment & Plan Note (Signed)
Discussed FODMAP diet with patient f/u with GI as necessary.

## 2021-04-18 NOTE — Assessment & Plan Note (Signed)
A1c was ordered today patient is not prediabetic.  Continue to exercise as tolerated.

## 2021-04-18 NOTE — Assessment & Plan Note (Signed)
Patient to continue to follow-up with nephrologist as scheduled

## 2021-04-18 NOTE — Assessment & Plan Note (Signed)
Followed by GI last colonoscopy 12/21/2020.

## 2021-04-23 ENCOUNTER — Ambulatory Visit (INDEPENDENT_AMBULATORY_CARE_PROVIDER_SITE_OTHER): Payer: Medicare Other | Admitting: Family

## 2021-04-23 ENCOUNTER — Other Ambulatory Visit: Payer: Self-pay

## 2021-04-23 ENCOUNTER — Encounter: Payer: Self-pay | Admitting: Family

## 2021-04-23 VITALS — BP 172/98 | HR 96 | Temp 97.8°F | Ht 61.0 in | Wt 118.0 lb

## 2021-04-23 DIAGNOSIS — J40 Bronchitis, not specified as acute or chronic: Secondary | ICD-10-CM | POA: Insufficient documentation

## 2021-04-23 DIAGNOSIS — S81811D Laceration without foreign body, right lower leg, subsequent encounter: Secondary | ICD-10-CM | POA: Diagnosis not present

## 2021-04-23 MED ORDER — DOXYCYCLINE HYCLATE 100 MG PO TABS: 100.0000 mg | ORAL_TABLET | Freq: Two times a day (BID) | ORAL | 0 refills | Status: DC

## 2021-04-23 MED ORDER — DOXYCYCLINE HYCLATE 50 MG PO CAPS: ORAL_CAPSULE | ORAL | 0 refills | Status: DC

## 2021-04-23 MED ORDER — ALBUTEROL SULFATE HFA 108 (90 BASE) MCG/ACT IN AERS: 2.0000 | INHALATION_SPRAY | Freq: Four times a day (QID) | RESPIRATORY_TRACT | 0 refills | Status: DC | PRN

## 2021-04-23 NOTE — Assessment & Plan Note (Signed)
Albuterol refill sent patient advised to take only as directed.  If shortness of breath is ongoing may consider work-up for COPD and/or consider low-dose CT for lung screening.  Doxycycline that was prescribed for the leg laceration will also be helpful for bronchitis.  Patient to take as directed.  Patient to follow-up if worsening signs and/or symptoms.

## 2021-04-23 NOTE — Patient Instructions (Signed)
If any worsening s/s infection please come in asap for further evaluation.. pending wound culture to ensure appropriate antbx   Antbx sent to pharmacy, take as directed.   It was a pleasure seeing you today! Please do not hesitate to reach out with any questions and or concerns.  Regards,   Eugenia Pancoast

## 2021-04-23 NOTE — Progress Notes (Signed)
Subjective:     Patient ID: Diane Gomez, female    DOB: 10-05-1955, 65 y.o.   MRN: [id number]  Chief Complaint  Patient presents with   Wound Check    Right shin     Wound Check  Patient is in today for worsening signs of infection of her right anterior shin.  She has been applying mupirocin twice a day and cleaning the wound nightly.  She has noticed yellow discharge from the site and redness surrounding the wound.  She states it is very tender and uncomfortable she denies fever and chills.  Pt with dry nonproductive cough, worse in the morning. She has been using her inhaler, more so in the last seven days ago. She is using two puffs in the am. Hears wheezing with her cough but not always, does smoke daily with marijuana.  Denies nasal congestion.  Does have some chest congestion especially in the morning.  She used to smoke cigarettes from age 80 to 65 years old and then she has been using marijuana almost daily since she was 65 years old.  Health Maintenance Due  Topic Date Due   Hepatitis C Screening  Never done   COVID-19 Vaccine (3 - Pfizer risk series) 06/07/2020    Past Medical History:  Diagnosis Date   Alcohol abuse, uncomplicated 06/14/1476   Allergy    Alpha galactosidase deficiency    Anemia    Anxiety    Asthma    from cats and horses only   Cancer ([place]) 15 years ago   melanoma    Fibroid    SUBMUCOUS MYOMA   GERD (gastroesophageal reflux disease)    Heart murmur    History of nonmelanoma skin cancer 06/27/2014   Formatting of this note might be different from the original. Formatting of this note may be different from the original. Skin Cancer History- Non-Melanoma Skin Cancer  Diagnosis Location Biopsy Date Treatment date Procedure Surgeon  [facility] Left temple, scalp  2008 ED&C [place]  [facility] Left temple  2010    [facility] Left neck  2001    [place]  Right upper lip  2007 excision   [facility]  L upper chest  2007 ED&C   [facility]   Hypertension    Osteoporosis 06/2017   T score  -2.6   Tumors    liver    Past Surgical History:  Procedure Laterality Date   CESAREAN SECTION     ENDOMETRIAL ABLATION  2009   HER OPTION   HYSTEROSCOPY  2008   MYOMECTOMY   TUBAL LIGATION      Family History  Problem Relation Age of Onset   Hypertension Mother    Parkinsonism Sister        PARKINSON'S DISEASE   Breast cancer Other        Niece  Age 70   Aneurysm Father    Seizures Sister    Heart disease Brother    Colon cancer Neg Hx    Stomach cancer Neg Hx    Esophageal cancer Neg Hx     Social History   Socioeconomic History   Marital status: Divorced    Spouse name: Not on file   Number of children: Not on file   Years of education: Not on file   Highest education level: Not on file  Occupational History   Not on file  Tobacco Use   Smoking status: Former    Types: Cigarettes   Smokeless tobacco: Never  Vaping Use  Vaping Use: Never used  Substance and Sexual Activity   Alcohol use: Yes    Alcohol/week: 14.0 standard drinks    Types: 7 Shots of liquor, 7 Standard drinks or equivalent per week    Comment: moderate..."at least one drink per day" Vodka per pt   Drug use: Yes    Frequency: 2.0 times per week    Types: Marijuana    Comment: occasional    Sexual activity: Not Currently    Birth control/protection: Post-menopausal, Surgical  Other Topics Concern   Not on file  Social History Narrative   Not on file   Social Determinants of Health   Financial Resource Strain: Not on file  Food Insecurity: Not on file  Transportation Needs: Not on file  Physical Activity: Not on file  Stress: Not on file  Social Connections: Not on file  Intimate Partner Violence: Not on file    Outpatient Medications Prior to Visit  Medication Sig Dispense Refill   ALPRAZolam (XANAX) 0.5 MG tablet Take 0.5 mg by mouth at bedtime as needed for anxiety.     budesonide (ENTOCORT EC) 3 MG 24 hr capsule Take by mouth daily.     diltiazem (TIAZAC) 300 MG 24 hr  capsule Take 180 mg by mouth daily.     esomeprazole (NEXIUM) 20 MG capsule Take 1 capsule (20 mg total) by mouth every other day.     metoprolol succinate (TOPROL-XL) 50 MG 24 hr tablet Take 1 tablet by mouth daily.      mupirocin cream (BACTROBAN) 2 % Apply 1 application topically 2 (two) times daily. 14 g 0   Probiotic Product (PROBIOTIC-10 PO) Take by mouth.     albuterol (VENTOLIN HFA) 108 (90 Base) MCG/ACT inhaler Inhale 2 puffs into the lungs every 6 (six) hours as needed for wheezing or shortness of breath. 18 g 0   No facility-administered medications prior to visit.    Allergies  Allergen Reactions   Ondansetron     Bilateral eye swelling without breathing changes    Penicillins Hives   Brimonidine     Bit by a tick    Meat [Alpha-Gal]    Other     Cats and horses   Shellfish Allergy     Review of Systems  Constitutional:  Negative for chills and fever.  HENT:  Negative for congestion, ear pain and sore throat.   Respiratory:  Positive for cough (dry non productive cough in am) and wheezing (worse with coughing). Negative for shortness of breath.   Cardiovascular:  Negative for chest pain.  Skin:  Positive for rash (right anterior wound that is worsening with surrounding redness and yellow drainage).      Objective:    Physical Exam Constitutional:      General: She is not in acute distress.    Appearance: Normal appearance. She is normal weight. She is not ill-appearing.  Cardiovascular:     Rate and Rhythm: Normal rate and regular rhythm.  Pulmonary:     Effort: Pulmonary effort is normal.     Breath sounds: No wheezing.  Skin:    Findings: Lesion (right anterior wound on the shin, white budding.  Surrounding erythema.  Approximately 2 cm x 2 cm. warm and tender to touch.) present.  Neurological:     Mental Status: She is alert.    BP (!) 172/98   Pulse 96   Temp 97.8 F (36.6 C) (Temporal)   Ht 5\' 1"  (1.549 m)   Wt  118 lb (53.5 kg)   SpO2 96%    BMI 22.30 kg/m  Wt Readings from Last 3 Encounters:  04/23/21 118 lb (53.5 kg)  04/17/21 119 lb (54 kg)  04/13/21 120 lb (54.4 kg)       Assessment & Plan:   Problem List Items Addressed This Visit       Respiratory   Bronchitis    Albuterol refill sent patient advised to take only as directed.  If shortness of breath is ongoing may consider work-up for COPD and/or consider low-dose CT for lung screening.  Doxycycline that was prescribed for the leg laceration will also be helpful for bronchitis.  Patient to take as directed.  Patient to follow-up if worsening signs and/or symptoms.      Relevant Medications   albuterol (VENTOLIN HFA) 108 (90 Base) MCG/ACT inhaler     Other   Leg laceration, right, subsequent encounter - Primary    Doxycycline sent to the pharmacy patient to take as directed.  If worsening signs of infection please come back immediately.  If fever and/or chills please go to the emergency room.  Wound culture sent to lab pending results we will notify patient once results received.      Relevant Medications   doxycycline (VIBRAMYCIN) 50 MG capsule   Other Relevant Orders   WOUND CULTURE    I have discontinued [name]. Thiemann's doxycycline. I am also having her start on doxycycline. Additionally, I am having her maintain her diltiazem, esomeprazole, metoprolol succinate, Probiotic Product (PROBIOTIC-10 PO), ALPRAZolam, budesonide, mupirocin cream, and albuterol.  Meds ordered this encounter  Medications   DISCONTD: doxycycline (VIBRA-TABS) 100 MG tablet    Sig: Take 1 tablet (100 mg total) by mouth 2 (two) times daily.    Dispense:  20 tablet    Refill:  0    Order Specific Question:   Supervising Provider    Answer:   BEDSOLE, AMY E [[id number]]   doxycycline (VIBRAMYCIN) 50 MG capsule    Sig: Take two tablets (total 100 mg) twice daily for ten days    Dispense:  20 capsule    Refill:  0    Order Specific Question:   Supervising Provider    Answer:    BEDSOLE, AMY E [[id number]]   albuterol (VENTOLIN HFA) 108 (90 Base) MCG/ACT inhaler    Sig: Inhale 2 puffs into the lungs every 6 (six) hours as needed for wheezing or shortness of breath.    Dispense:  18 g    Refill:  0    Maximum Refills Reached    Order Specific Question:   Supervising Provider    Answer:   BEDSOLE, AMY E [[id number]]

## 2021-04-23 NOTE — Assessment & Plan Note (Addendum)
Doxycycline sent to the pharmacy patient to take as directed.  If worsening signs of infection please come back immediately.  If fever and/or chills please go to the emergency room.  Wound culture sent to lab pending results we will notify patient once results received.

## 2021-04-25 ENCOUNTER — Telehealth: Payer: Self-pay | Admitting: Family

## 2021-04-25 NOTE — Telephone Encounter (Signed)
Patient does state that she has been doing pretty good but she has the wound out in open so it dries up. Patient states she is doing everything she can to keep it dry and keep an eye on it. Patient stated understanding and appreciation for the call.

## 2021-04-25 NOTE — Telephone Encounter (Signed)
Culture of the wound came back with no growth.  How is patient doing with the addition of doxycycline AKA the antibiotic?  If she having improvement of symptoms yet?  Issues or is she having any worsening of symptoms?

## 2021-04-27 ENCOUNTER — Encounter: Payer: Self-pay | Admitting: Family

## 2021-04-27 ENCOUNTER — Other Ambulatory Visit: Payer: Self-pay

## 2021-04-27 ENCOUNTER — Ambulatory Visit (INDEPENDENT_AMBULATORY_CARE_PROVIDER_SITE_OTHER): Payer: Medicare Other | Admitting: Family

## 2021-04-27 VITALS — BP 136/78 | HR 69 | Temp 97.4°F | Ht 61.0 in | Wt 116.0 lb

## 2021-04-27 DIAGNOSIS — S81801A Unspecified open wound, right lower leg, initial encounter: Secondary | ICD-10-CM | POA: Diagnosis not present

## 2021-04-27 DIAGNOSIS — S81801D Unspecified open wound, right lower leg, subsequent encounter: Secondary | ICD-10-CM

## 2021-04-27 DIAGNOSIS — S81811D Laceration without foreign body, right lower leg, subsequent encounter: Secondary | ICD-10-CM

## 2021-04-27 LAB — WOUND CULTURE
GRAM STAIN:: NONE SEEN
MICRO NUMBER:: 12683933
RESULT:: NO GROWTH
SPECIMEN QUALITY:: ADEQUATE

## 2021-04-27 MED ORDER — CLINDAMYCIN HCL 300 MG PO CAPS: 300.0000 mg | ORAL_CAPSULE | Freq: Four times a day (QID) | ORAL | 0 refills | Status: AC

## 2021-04-27 NOTE — Patient Instructions (Addendum)
I have sent a new prescription of clindamycin to the pharmacy however I would like to keep this as a pocket prescription in case there is worsening of the wound over the weekend.  If that is to occur I will have you stop the doxycycline and start the clindamycin.  Stop applying the ointment to the wound and also stop applying iodine and/or hydrogen peroxide.  You only want to care for it with water and mild soap daily.  Try to keep the wound covered when you are out and about otherwise you can keep it dry and open to the air at home as long as your leg is elevated and you are not touching anything.  If any fever and/or chills please go to the emergency room.  It was a pleasure seeing you today! Please do not hesitate to reach out with any questions and or concerns.  Regards,   Eugenia Pancoast

## 2021-04-27 NOTE — Progress Notes (Signed)
Established Patient Office Visit  Subjective:  Patient ID: Diane Gomez, female    DOB: 04-17-1956  Age: 65 y.o. MRN: [id number]  CC:  Chief Complaint  Patient presents with   Wound Check    HPI CESIA ORF is here today here for f/u on right anterior leg/shin wound.  Currently on day four of doxycycline 100 mg twice daily. She does state that it is getting smaller in size, but still with surrounding redness and tenderness. Still with some yellow drainage. Still applying mupirocin daily, and she states she started apply hydrogen peroxide as well as iodine daily per her sisters recommendation.   Wound culture was unrevealing, may need repeat testing today.  Past Medical History:  Diagnosis Date   Alcohol abuse, uncomplicated 1/47/8295   Allergy    Alpha galactosidase deficiency    Anemia    Anxiety    Asthma    from cats and horses only   Cancer ([place]) 15 years ago   melanoma    Fibroid    SUBMUCOUS MYOMA   GERD (gastroesophageal reflux disease)    Heart murmur    History of nonmelanoma skin cancer 06/27/2014   Formatting of this note might be different from the original. Formatting of this note may be different from the original. Skin Cancer History- Non-Melanoma Skin Cancer  Diagnosis Location Biopsy Date Treatment date Procedure Surgeon  [facility] Left temple, scalp  2008 ED&C [place]  [facility] Left temple  2010    [facility] Left neck  2001    [place]  Right upper lip  2007 excision   [facility]  L upper chest  2007 ED&C   [facility]   Hypertension    Osteoporosis 06/2017   T score -2.6   Tumors    liver    Past Surgical History:  Procedure Laterality Date   CESAREAN SECTION     ENDOMETRIAL ABLATION  2009   HER OPTION   HYSTEROSCOPY  2008   MYOMECTOMY   TUBAL LIGATION      Family History  Problem Relation Age of Onset   Hypertension Mother    Parkinsonism Sister        PARKINSON'S DISEASE   Breast cancer Other        Niece  Age 38   Aneurysm Father    Seizures Sister    Heart  disease Brother    Colon cancer Neg Hx    Stomach cancer Neg Hx    Esophageal cancer Neg Hx     Social History   Socioeconomic History   Marital status: Divorced    Spouse name: Not on file   Number of children: Not on file   Years of education: Not on file   Highest education level: Not on file  Occupational History   Not on file  Tobacco Use   Smoking status: Former    Types: Cigarettes   Smokeless tobacco: Never  Vaping Use   Vaping Use: Never used  Substance and Sexual Activity   Alcohol use: Yes    Alcohol/week: 14.0 standard drinks    Types: 7 Shots of liquor, 7 Standard drinks or equivalent per week    Comment: moderate..."at least one drink per day" Vodka per pt   Drug use: Yes    Frequency: 2.0 times per week    Types: Marijuana    Comment: occasional    Sexual activity: Not Currently    Birth control/protection: Post-menopausal, Surgical  Other Topics Concern   Not on  file  Social History Narrative   Not on file   Social Determinants of Health   Financial Resource Strain: Not on file  Food Insecurity: Not on file  Transportation Needs: Not on file  Physical Activity: Not on file  Stress: Not on file  Social Connections: Not on file  Intimate Partner Violence: Not on file    Outpatient Medications Prior to Visit  Medication Sig Dispense Refill   albuterol (VENTOLIN HFA) 108 (90 Base) MCG/ACT inhaler Inhale 2 puffs into the lungs every 6 (six) hours as needed for wheezing or shortness of breath. 18 g 0   ALPRAZolam (XANAX) 0.5 MG tablet Take 0.5 mg by mouth at bedtime as needed for anxiety.     budesonide (ENTOCORT EC) 3 MG 24 hr capsule Take by mouth daily.     diltiazem (TIAZAC) 300 MG 24 hr capsule Take 180 mg by mouth daily.     doxycycline (VIBRAMYCIN) 50 MG capsule Take two tablets (total 100 mg) twice daily for ten days 20 capsule 0   esomeprazole (NEXIUM) 20 MG capsule Take 1 capsule (20 mg total) by mouth every other day.     metoprolol  succinate (TOPROL-XL) 50 MG 24 hr tablet Take 1 tablet by mouth daily.      mupirocin cream (BACTROBAN) 2 % Apply 1 application topically 2 (two) times daily. 14 g 0   Probiotic Product (PROBIOTIC-10 PO) Take by mouth.     No facility-administered medications prior to visit.    Allergies  Allergen Reactions   Ondansetron     Bilateral eye swelling without breathing changes    Penicillins Hives   Brimonidine     Bit by a tick    Meat [Alpha-Gal]    Other     Cats and horses   Shellfish Allergy     ROS Review of Systems  Constitutional:  Negative for chills and fever.  Musculoskeletal:  Negative for arthralgias, joint swelling and myalgias.  Skin:  Positive for wound (right anterior wound still with surrounding erythema and yellow drainage. tender to touch, slight decrease and healing in wound bed size). Negative for rash.     Objective:    Physical Exam Constitutional:      General: She is not in acute distress.    Appearance: Normal appearance. She is normal weight. She is not ill-appearing or toxic-appearing.  HENT:     Head: Normocephalic.  Skin:    General: Skin is warm.     Findings: Wound (right anterior shin with surrounding erythema and tenderness to touch surorunding wound bed. approx 2 cm in size. yellow pus visualized) present.  Neurological:     General: No focal deficit present.     Mental Status: She is alert and oriented to person, place, and time.  Psychiatric:        Mood and Affect: Mood normal.        Behavior: Behavior normal.        Thought Content: Thought content normal.        Judgment: Judgment normal.      BP 136/78   Pulse 69   Temp (!) 97.4 F (36.3 C) (Temporal)   Ht 5\' 1"  (1.549 m)   Wt 116 lb (52.6 kg)   SpO2 97%   BMI 21.92 kg/m  Wt Readings from Last 3 Encounters:  04/27/21 116 lb (52.6 kg)  04/23/21 118 lb (53.5 kg)  04/17/21 119 lb (54 kg)     Health Maintenance Due  Topic Date Due   Hepatitis C Screening  Never  done   COVID-19 Vaccine (3 - Pfizer risk series) 06/07/2020    There are no preventive care reminders to display for this patient.  Lab Results  Component Value Date   TSH 0.622 07/08/2013   Lab Results  Component Value Date   WBC 8.9 04/08/2021   HGB 15.5 (H) 04/08/2021   HCT 43.3 04/08/2021   MCV 92.9 04/08/2021   PLT 341 04/08/2021   Lab Results  Component Value Date   NA 125 (L) 04/08/2021   K 4.0 04/08/2021   CO2 18 (L) 04/08/2021   GLUCOSE 190 (H) 04/08/2021   BUN 14 04/08/2021   CREATININE 1.29 (H) 04/08/2021   BILITOT 0.4 10/16/2020   ALKPHOS 71 10/16/2020   AST 19 10/16/2020   ALT 11 10/16/2020   PROT 8.0 10/16/2020   ALBUMIN 4.7 10/16/2020   CALCIUM 9.8 04/08/2021   ANIONGAP 13 04/08/2021   GFR 55.87 (L) 10/16/2020   Lab Results  Component Value Date   CHOL 256 (H) 12/03/2018   Lab Results  Component Value Date   HDL 94.00 12/03/2018   Lab Results  Component Value Date   LDLCALC 127 (H) 02/18/2017   Lab Results  Component Value Date   TRIG 376.0 (H) 12/03/2018   Lab Results  Component Value Date   CHOLHDL 3 12/03/2018   Lab Results  Component Value Date   HGBA1C 5.6 04/17/2021      Assessment & Plan:   Problem List Items Addressed This Visit       Other   Leg wound, right, subsequent encounter    Advised patient to continue the doxycycline as prescribed as there has been some noted improvement.  Recommended that patient stop applying the iodine stop applying the mupirocin and also to stop applying the hydrogen peroxide.  Just utilize warm water with mild soap once a day.  Patient informed to monitor very closely over the next few days for any worsening symptoms of infection.  If this does occur I have sent clindamycin to the pharmacy and will have her switch over the weekend, we discussed how to do this appropriately.  I would like to try and hold off and see if the discontinuation of the adjunct therapy that she is doing and at home will  allow the wound to heal as the doxycycline has been effective in closing the wound more so in the last week.  We are also pending another wound culture that was collected today and will proceed as follows following results.      Relevant Medications   clindamycin (CLEOCIN) 300 MG capsule   Other Relevant Orders   Anaerobic and Aerobic Culture   RESOLVED: Leg laceration, right, subsequent encounter - Primary   Relevant Orders   Anaerobic and Aerobic Culture    Meds ordered this encounter  Medications   clindamycin (CLEOCIN) 300 MG capsule    Sig: Take 1 capsule (300 mg total) by mouth 4 (four) times daily for 10 days.    Dispense:  40 capsule    Refill:  0    Order Specific Question:   Supervising Provider    Answer:   Diona Browner, AMY E [[id number]]    Follow-up: Return in about 4 days (around 05/01/2021) for to re-evaluate wound.    Eugenia Pancoast, FNP

## 2021-04-27 NOTE — Assessment & Plan Note (Signed)
Advised patient to continue the doxycycline as prescribed as there has been some noted improvement.  Recommended that patient stop applying the iodine stop applying the mupirocin and also to stop applying the hydrogen peroxide.  Just utilize warm water with mild soap once a day.  Patient informed to monitor very closely over the next few days for any worsening symptoms of infection.  If this does occur I have sent clindamycin to the pharmacy and will have her switch over the weekend, we discussed how to do this appropriately.  I would like to try and hold off and see if the discontinuation of the adjunct therapy that she is doing and at home will allow the wound to heal as the doxycycline has been effective in closing the wound more so in the last week.  We are also pending another wound culture that was collected today and will proceed as follows following results.

## 2021-04-30 ENCOUNTER — Telehealth: Payer: Self-pay | Admitting: Family

## 2021-04-30 ENCOUNTER — Other Ambulatory Visit: Payer: Self-pay | Admitting: Family

## 2021-04-30 DIAGNOSIS — S81801D Unspecified open wound, right lower leg, subsequent encounter: Secondary | ICD-10-CM

## 2021-04-30 NOTE — Telephone Encounter (Signed)
Pt called in stating that her wound on right leg is still red, has pus coming out, still warm to touch and still look swollen. Pt said she would like to be called  [phone]

## 2021-04-30 NOTE — Telephone Encounter (Signed)
Patient was called, this encounter was completed on result notes. Patient is changing ABX and will start today at lunch, mid evening and bedtime. Then the patient will start taking clindamycin QID.

## 2021-04-30 NOTE — Telephone Encounter (Signed)
Patient called and informed that she should be getting a call from the wound care. Patient stated understanding and appreciation. Patient is aware to keep appointment unless the wound clinic can see here this week.

## 2021-05-03 ENCOUNTER — Encounter: Payer: Self-pay | Admitting: Family

## 2021-05-03 ENCOUNTER — Ambulatory Visit: Payer: Medicare Other

## 2021-05-03 ENCOUNTER — Ambulatory Visit (INDEPENDENT_AMBULATORY_CARE_PROVIDER_SITE_OTHER): Payer: Medicare Other

## 2021-05-03 ENCOUNTER — Other Ambulatory Visit: Payer: Self-pay

## 2021-05-03 ENCOUNTER — Ambulatory Visit (INDEPENDENT_AMBULATORY_CARE_PROVIDER_SITE_OTHER): Payer: Medicare Other | Admitting: Family

## 2021-05-03 VITALS — BP 138/82 | HR 70 | Temp 97.8°F | Ht 61.0 in | Wt 117.0 lb

## 2021-05-03 DIAGNOSIS — M79604 Pain in right leg: Secondary | ICD-10-CM

## 2021-05-03 DIAGNOSIS — S81801D Unspecified open wound, right lower leg, subsequent encounter: Secondary | ICD-10-CM | POA: Diagnosis not present

## 2021-05-03 LAB — ANAEROBIC AND AEROBIC CULTURE
AER RESULT:: NO GROWTH
MICRO NUMBER:: 12707616
MICRO NUMBER:: 12707617
SPECIMEN QUALITY:: ADEQUATE
SPECIMEN QUALITY:: ADEQUATE

## 2021-05-03 NOTE — Patient Instructions (Addendum)
You have an appointment tomorrow for an ultrasound to rule out a DVT in the right lower leg:  05/04/21 at 8am, [facility] 7:45  [facility] [address], [place] to monitor the wound site for worsening symptoms of infection to include increasing redness around the area, increasing swelling, and/or drainage from the wound.  If any fever or chills please notify me immediately.  Continue duration of antibiotics prescribed.  Complete xray(s) prior to leaving today. I will notify you of your results once received.  It was a pleasure seeing you today! Please do not hesitate to reach out with any questions and or concerns.  Regards,   Eugenia Pancoast FNP-C

## 2021-05-03 NOTE — Progress Notes (Signed)
Established Patient Office Visit  Subjective:  Patient ID: Diane Gomez, female    DOB: Oct 02, 1955  Age: 65 y.o. MRN: [id number]  CC:  Chief Complaint  Patient presents with   Wound Check    HPI Diane Gomez is here today here for f/u on right anterior leg/shin wound.   She d/c doxoycycline and started clindamycin, she is tolerating well without any abdominal upset diarrhea and constipation.  She has had some improvement in the leg wound and states that it is healing a bit and scabbing over.  There is still surrounding redness and warm to the touch.  She states there is no longer any drainage from the site.  She tends to keep the wound covered with any bandage. Denies fever and or chills  Past Medical History:  Diagnosis Date   Alcohol abuse, uncomplicated 11/14/3084   Allergy    Alpha galactosidase deficiency    Anemia    Anxiety    Asthma    from cats and horses only   Cancer ([place]) 15 years ago   melanoma    Fibroid    SUBMUCOUS MYOMA   GERD (gastroesophageal reflux disease)    Heart murmur    History of nonmelanoma skin cancer 06/27/2014   Formatting of this note might be different from the original. Formatting of this note may be different from the original. Skin Cancer History- Non-Melanoma Skin Cancer  Diagnosis Location Biopsy Date Treatment date Procedure Surgeon  [facility] Left temple, scalp  2008 ED&C [place]  [facility] Left temple  2010    [facility] Left neck  2001    [place]  Right upper lip  2007 excision   [facility]  L upper chest  2007 ED&C   [facility]   Hypertension    Osteoporosis 06/2017   T score -2.6   Tumors    liver    Past Surgical History:  Procedure Laterality Date   CESAREAN SECTION     ENDOMETRIAL ABLATION  2009   HER OPTION   HYSTEROSCOPY  2008   MYOMECTOMY   TUBAL LIGATION      Family History  Problem Relation Age of Onset   Hypertension Mother    Parkinsonism Sister        PARKINSON'S DISEASE   Breast cancer Other        Niece  Age 39   Aneurysm Father     Seizures Sister    Heart disease Brother    Colon cancer Neg Hx    Stomach cancer Neg Hx    Esophageal cancer Neg Hx     Social History   Socioeconomic History   Marital status: Divorced    Spouse name: Not on file   Number of children: Not on file   Years of education: Not on file   Highest education level: Not on file  Occupational History   Not on file  Tobacco Use   Smoking status: Former    Types: Cigarettes   Smokeless tobacco: Never  Vaping Use   Vaping Use: Never used  Substance and Sexual Activity   Alcohol use: Yes    Alcohol/week: 14.0 standard drinks    Types: 7 Shots of liquor, 7 Standard drinks or equivalent per week    Comment: moderate..."at least one drink per day" Vodka per pt   Drug use: Yes    Frequency: 2.0 times per week    Types: Marijuana    Comment: occasional    Sexual activity: Not Currently  Birth control/protection: Post-menopausal, Surgical  Other Topics Concern   Not on file  Social History Narrative   Not on file   Social Determinants of Health   Financial Resource Strain: Not on file  Food Insecurity: Not on file  Transportation Needs: Not on file  Physical Activity: Not on file  Stress: Not on file  Social Connections: Not on file  Intimate Partner Violence: Not on file    Outpatient Medications Prior to Visit  Medication Sig Dispense Refill   albuterol (VENTOLIN HFA) 108 (90 Base) MCG/ACT inhaler Inhale 2 puffs into the lungs every 6 (six) hours as needed for wheezing or shortness of breath. 18 g 0   ALPRAZolam (XANAX) 0.5 MG tablet Take 0.5 mg by mouth at bedtime as needed for anxiety.     clindamycin (CLEOCIN) 300 MG capsule Take 1 capsule (300 mg total) by mouth 4 (four) times daily for 10 days. 40 capsule 0   diltiazem (TIAZAC) 300 MG 24 hr capsule Take 180 mg by mouth daily.     esomeprazole (NEXIUM) 20 MG capsule Take 1 capsule (20 mg total) by mouth every other day.     metoprolol succinate (TOPROL-XL) 50 MG 24 hr  tablet Take 1 tablet by mouth daily.      Probiotic Product (PROBIOTIC-10 PO) Take by mouth.     budesonide (ENTOCORT EC) 3 MG 24 hr capsule Take by mouth daily.     doxycycline (VIBRAMYCIN) 50 MG capsule Take two tablets (total 100 mg) twice daily for ten days 20 capsule 0   mupirocin cream (BACTROBAN) 2 % Apply 1 application topically 2 (two) times daily. (Patient not taking: Reported on 05/03/2021) 14 g 0   No facility-administered medications prior to visit.    Allergies  Allergen Reactions   Ondansetron     Bilateral eye swelling without breathing changes    Penicillins Hives   Brimonidine     Bit by a tick    Meat [Alpha-Gal]    Other     Cats and horses   Shellfish Allergy     ROS Review of Systems  Constitutional:  Negative for chills and fever.  Gastrointestinal:  Negative for abdominal pain, constipation, diarrhea, nausea and vomiting.  Musculoskeletal:  Positive for arthralgias (rle leg pain , worse with walking. surorunding wound). Negative for myalgias.  Skin:  Positive for wound (right anterior wound healing and scabbing over since last visit, still surrounding erythema and warmth. no drainages, slight swelling around wound).  All other systems reviewed and are negative.    Objective:    Physical Exam Constitutional:      General: She is not in acute distress.    Appearance: Normal appearance. She is normal weight. She is not ill-appearing or toxic-appearing.  HENT:     Head: Normocephalic.  Musculoskeletal:     Right lower leg: Swelling and laceration (right anterior wound with scabbing and surrounding erythema of wound. center not fluctuant. tenderness to surrounding wound site. warm to touch.) present. No edema.     Left lower leg: Normal.  Skin:    General: Skin is warm.  Neurological:     General: No focal deficit present.     Mental Status: She is alert and oriented to person, place, and time.  Psychiatric:        Mood and Affect: Mood normal.         Behavior: Behavior normal.        Thought Content: Thought content normal.  Judgment: Judgment normal.     BP 138/82   Pulse 70   Temp 97.8 F (36.6 C) (Temporal)   Ht 5\' 1"  (1.549 m)   Wt 117 lb (53.1 kg)   SpO2 98%   BMI 22.11 kg/m  Wt Readings from Last 3 Encounters:  05/03/21 117 lb (53.1 kg)  04/27/21 116 lb (52.6 kg)  04/23/21 118 lb (53.5 kg)     Health Maintenance Due  Topic Date Due   Hepatitis C Screening  Never done   COVID-19 Vaccine (3 - Pfizer risk series) 06/07/2020    There are no preventive care reminders to display for this patient.  Lab Results  Component Value Date   TSH 0.622 07/08/2013   Lab Results  Component Value Date   WBC 8.9 04/08/2021   HGB 15.5 (H) 04/08/2021   HCT 43.3 04/08/2021   MCV 92.9 04/08/2021   PLT 341 04/08/2021   Lab Results  Component Value Date   NA 125 (L) 04/08/2021   K 4.0 04/08/2021   CO2 18 (L) 04/08/2021   GLUCOSE 190 (H) 04/08/2021   BUN 14 04/08/2021   CREATININE 1.29 (H) 04/08/2021   BILITOT 0.4 10/16/2020   ALKPHOS 71 10/16/2020   AST 19 10/16/2020   ALT 11 10/16/2020   PROT 8.0 10/16/2020   ALBUMIN 4.7 10/16/2020   CALCIUM 9.8 04/08/2021   ANIONGAP 13 04/08/2021   GFR 55.87 (L) 10/16/2020   Lab Results  Component Value Date   CHOL 256 (H) 12/03/2018   Lab Results  Component Value Date   HDL 94.00 12/03/2018   Lab Results  Component Value Date   LDLCALC 127 (H) 02/18/2017   Lab Results  Component Value Date   TRIG 376.0 (H) 12/03/2018   Lab Results  Component Value Date   CHOLHDL 3 12/03/2018   Lab Results  Component Value Date   HGBA1C 5.6 04/17/2021      Assessment & Plan:   Problem List Items Addressed This Visit       Other   Leg wound, right - Primary    nonadherant pad and koban dressing applied to wound site. Pt tolerated well . Pt to continue current antbiotic therapy of clindamycin QID. Ordered ultrasound right le to rule out DVT and also ordered XRAY  right leg to r/o osteomyelitis due to leg pain. Pending results. Pt to continue to monitor daily for worsening s/s infection.      Other Visit Diagnoses     Anterior leg pain, right       Relevant Orders   DG FEMUR PORT, MIN 2 VIEWS RIGHT   US Venous Img Lower Unilateral Right (DVT) (Completed)   DG Tibia/Fibula Right (Completed)       No orders of the defined types were placed in this encounter.   Follow-up: Return in about 1 week (around 05/10/2021) for f/u on wound.    Eugenia Pancoast, FNP

## 2021-05-04 ENCOUNTER — Ambulatory Visit
Admission: RE | Admit: 2021-05-04 | Discharge: 2021-05-04 | Disposition: A | Discharge status: Home / Self Care | Payer: Medicare Other | Source: Ambulatory Visit | Attending: Family | Admitting: Family

## 2021-05-04 DIAGNOSIS — S81801D Unspecified open wound, right lower leg, subsequent encounter: Secondary | ICD-10-CM | POA: Diagnosis present

## 2021-05-04 DIAGNOSIS — M79604 Pain in right leg: Secondary | ICD-10-CM | POA: Insufficient documentation

## 2021-05-04 NOTE — Assessment & Plan Note (Signed)
nonadherant pad and koban dressing applied to wound site. Pt tolerated well . Pt to continue current antbiotic therapy of clindamycin QID. Ordered ultrasound right le to rule out DVT and also ordered XRAY right leg to r/o osteomyelitis due to leg pain. Pending results. Pt to continue to monitor daily for worsening s/s infection.

## 2021-05-04 NOTE — Assessment & Plan Note (Signed)
Xray LE as well as u/s LE ordered to r/o DVT and or osteomyelitis, pending results.

## 2021-05-07 ENCOUNTER — Telehealth: Payer: Self-pay | Admitting: Family

## 2021-05-07 NOTE — Telephone Encounter (Signed)
Pt called in wanting to know if test results are back for her right leg

## 2021-05-07 NOTE — Telephone Encounter (Signed)
Patient called and informed of her test results. Patient stated understood, and was given number to the wound clinic. Patient stated understanding and will call and confirm appointment with wound clinic. Patient informed to call back if she can not get in touch with wound clinic for a follow up.

## 2021-05-07 NOTE — Telephone Encounter (Signed)
I responded in MyChart on the results.. The ultrasound did not show any concerning findings, negative for bone infection and negative for blood clot. Is the wounds not improving?  Is the pain getting worse? If yes, please advise pt to call back wound care and set up appointment.

## 2021-05-10 ENCOUNTER — Encounter: Payer: Medicare Other | Attending: Physician Assistant | Admitting: Physician Assistant

## 2021-05-10 ENCOUNTER — Other Ambulatory Visit: Payer: Self-pay

## 2021-05-10 DIAGNOSIS — I129 Hypertensive chronic kidney disease with stage 1 through stage 4 chronic kidney disease, or unspecified chronic kidney disease: Secondary | ICD-10-CM | POA: Insufficient documentation

## 2021-05-10 DIAGNOSIS — N183 Chronic kidney disease, stage 3 unspecified: Secondary | ICD-10-CM | POA: Insufficient documentation

## 2021-05-10 DIAGNOSIS — L97818 Non-pressure chronic ulcer of other part of right lower leg with other specified severity: Secondary | ICD-10-CM | POA: Insufficient documentation

## 2021-05-10 DIAGNOSIS — Z87891 Personal history of nicotine dependence: Secondary | ICD-10-CM | POA: Diagnosis not present

## 2021-05-10 DIAGNOSIS — W2209XA Striking against other stationary object, initial encounter: Secondary | ICD-10-CM | POA: Insufficient documentation

## 2021-05-10 DIAGNOSIS — S81811A Laceration without foreign body, right lower leg, initial encounter: Secondary | ICD-10-CM | POA: Insufficient documentation

## 2021-05-11 NOTE — Progress Notes (Signed)
Diane Gomez ([id number]) Visit Report for 05/10/2021 Chief Complaint Document Details Patient Name: Diane Gomez Date of Service: 05/10/2021 8:45 AM Medical Record Number: [id number] Patient Account Number: [id number] Date of Birth/Sex: [birth year] (65 y.o. F) Treating RN: Carlene Coria Primary Care Provider: Eugenia Pancoast Other Clinician: Referring Provider: Eugenia Pancoast Treating Provider/Extender: Skipper Cliche in Treatment: 0 Information Obtained from: Patient Chief Complaint Right leg ulcer Electronic Signature(s) Signed: 05/10/2021 9:58:45 AM By: Worthy Keeler PA-C Entered By: Worthy Keeler on 05/10/2021 09:58:45 AMAHIA, Gomez ([id number]) -------------------------------------------------------------------------------- HPI Details Patient Name: Diane Gomez Date of Service: 05/10/2021 8:45 AM Medical Record Number: [id number] Patient Account Number: [id number] Date of Birth/Sex: 11/19/1955 (65 y.o. F) Treating RN: Carlene Coria Primary Care Provider: Eugenia Pancoast Other Clinician: Referring Provider: Eugenia Pancoast Treating Provider/Extender: Skipper Cliche in Treatment: 0 History of Present Illness HPI Description: 05/10/2021 upon evaluation today patient presents for an issue which actually began roughly 3 weeks ago. She tells me that she actually had a car door hit her on the leg which unfortunately ended up with her having a significant wound. This was bleeding a lot and she did not go for any immediate treatment. Subsequently she therefore ended up having a lot of issues with bleeding and drainage. Eventually when she did see her primary care provider they told her that she is needed to leave it open to air and keep it elevated above her head is much as possible. This which has been trying to do. With that being said pretty much today this is just a eschar currently. I do not see anything that is actually open but again I cannot tell if  there is a significant 3 cm x 4.6 cm eschar covering the wound region. She has been on doxycycline and is currently. She is also have this again for about 3 weeks. Electronic Signature(s) Signed: 05/10/2021 7:09:47 PM By: Worthy Keeler PA-C Entered By: Worthy Keeler on 05/10/2021 19:09:46 Diane Gomez ([id number]) -------------------------------------------------------------------------------- Physical Exam Details Patient Name: Diane Gomez Date of Service: 05/10/2021 8:45 AM Medical Record Number: [id number] Patient Account Number: [id number] Date of Birth/Sex: December 27, 1955 (65 y.o. F) Treating RN: Carlene Coria Primary Care Provider: Eugenia Pancoast Other Clinician: Referring Provider: Eugenia Pancoast Treating Provider/Extender: Skipper Cliche in Treatment: 0 Constitutional patient is hypertensive.. pulse regular and within target range for patient.Marland Kitchen respirations regular, non-labored and within target range for patient.Marland Kitchen temperature within target range for patient.. Well-nourished and well-hydrated in no acute distress. Eyes conjunctiva clear no eyelid edema noted. pupils equal round and reactive to light and accommodation. Ears, Nose, Mouth, and Throat no gross abnormality of ear auricles or external auditory canals. normal hearing noted during conversation. mucus membranes moist. Respiratory normal breathing without difficulty. Cardiovascular 2+ dorsalis pedis/posterior tibialis pulses. no clubbing, cyanosis, significant edema, <3 sec cap refill. Musculoskeletal normal gait and posture. no significant deformity or arthritic changes, no loss or range of motion, no clubbing. Psychiatric this patient is able to make decisions and demonstrates good insight into disease process. Alert and Oriented x 3. pleasant and cooperative. Notes Upon inspection patient's wound actually had significant eschar covering the wound which I discussed with the patient we really needed to  see about performing a debridement on to clear away the eschar and necrotic debris. With that being said she did not want this to be removed at all for that reason there is really not much that I can do to help her with treatment  of this area otherwise. I do think that this could be hiding something more significant although it is also possible it could be perfectly okay and she could heal just fine. The only way to know would be to remove it but again she is reluctant to do that due to the fact how bad it was hurting when it was open. Electronic Signature(s) Signed: 05/10/2021 7:10:30 PM By: Worthy Keeler PA-C Entered By: Worthy Keeler on 05/10/2021 19:10:29 Diane Gomez ([id number]) -------------------------------------------------------------------------------- Physician Orders Details Patient Name: Diane Gomez Date of Service: 05/10/2021 8:45 AM Medical Record Number: [id number] Patient Account Number: [id number] Date of Birth/Sex: June 20, 1955 (65 y.o. F) Treating RN: Carlene Coria Primary Care Provider: Eugenia Pancoast Other Clinician: Referring Provider: Eugenia Pancoast Treating Provider/Extender: Skipper Cliche in Treatment: 0 Verbal / Phone Orders: No Diagnosis Coding ICD-10 Coding Code Description [phone] Laceration without foreign body, right lower leg, initial encounter L97.818 Non-pressure chronic ulcer of other part of right lower leg with other specified severity I10 Essential (primary) hypertension N18.30 Chronic kidney disease, stage 3 unspecified Discharge From [facility] Services o Consult Only Edema Control - Lymphedema / Segmental Compressive Device / Other o Elevate, Exercise Daily and Avoid Standing for Long Periods of Time. o Elevate legs to the level of the heart and pump ankles as often as possible o Elevate leg(s) parallel to the floor when sitting. Wound Treatment Electronic Signature(s) Signed: 05/10/2021 7:26:01 PM By: Worthy Keeler  PA-C Signed: 05/11/2021 4:49:51 PM By: Carlene Coria RN Entered By: Carlene Coria on 05/10/2021 10:07:51 Diane Gomez ([id number]) -------------------------------------------------------------------------------- Problem List Details Patient Name: Diane Gomez Date of Service: 05/10/2021 8:45 AM Medical Record Number: [id number] Patient Account Number: [id number] Date of Birth/Sex: 31-Jul-1955 (65 y.o. F) Treating RN: Carlene Coria Primary Care Provider: Eugenia Pancoast Other Clinician: Referring Provider: Eugenia Pancoast Treating Provider/Extender: Skipper Cliche in Treatment: 0 Active Problems ICD-10 Encounter Code Description Active Date MDM Diagnosis S81.811A Laceration without foreign body, right lower leg, initial encounter 05/10/2021 No Yes L97.818 Non-pressure chronic ulcer of other part of right lower leg with other 05/10/2021 No Yes specified severity I10 Essential (primary) hypertension 05/10/2021 No Yes N18.30 Chronic kidney disease, stage 3 unspecified 05/10/2021 No Yes Inactive Problems Resolved Problems Electronic Signature(s) Signed: 05/10/2021 9:58:28 AM By: Worthy Keeler PA-C Entered By: Worthy Keeler on 05/10/2021 09:58:28 Mccallister, [name] ([id number]) -------------------------------------------------------------------------------- Progress Note Details Patient Name: Diane Gomez Date of Service: 05/10/2021 8:45 AM Medical Record Number: [id number] Patient Account Number: [id number] Date of Birth/Sex: [birth year] (65 y.o. F) Treating RN: Carlene Coria Primary Care Provider: Eugenia Pancoast Other Clinician: Referring Provider: Eugenia Pancoast Treating Provider/Extender: Skipper Cliche in Treatment: 0 Subjective Chief Complaint Information obtained from Patient Right leg ulcer History of Present Illness (HPI) 05/10/2021 upon evaluation today patient presents for an issue which actually began roughly 3 weeks ago. She tells me that she  actually had a car door hit her on the leg which unfortunately ended up with her having a significant wound. This was bleeding a lot and she did not go for any immediate treatment. Subsequently she therefore ended up having a lot of issues with bleeding and drainage. Eventually when she did see her primary care provider they told her that she is needed to leave it open to air and keep it elevated above her head is much as possible. This which has been trying to do. With that being said pretty much today this is just a eschar  currently. I do not see anything that is actually open but again I cannot tell if there is a significant 3 cm x 4.6 cm eschar covering the wound region. She has been on doxycycline and is currently. She is also have this again for about 3 weeks. Patient History Information obtained from Patient. Allergies ondansetron, penicillin, brimonidine, shellfish derived Social History Former smoker, Marital Status - Divorced, Alcohol Use - Never, Drug Use - No History, Caffeine Use - Moderate. Medical History Cardiovascular Patient has history of Hypertension Review of Systems (ROS) Eyes Complains or has symptoms of Vision Changes, Glasses / Contacts. Genitourinary CKD 3 Integumentary (Skin) Complains or has symptoms of Wounds. Objective Constitutional patient is hypertensive.. pulse regular and within target range for patient.Marland Kitchen respirations regular, non-labored and within target range for patient.Marland Kitchen temperature within target range for patient.. Well-nourished and well-hydrated in no acute distress. Vitals Time Taken: 8:52 AM, Temperature: 98.5 F, Pulse: 65 bpm, Respiratory Rate: 16 breaths/min, Blood Pressure: 161/79 mmHg. Eyes conjunctiva clear no eyelid edema noted. pupils equal round and reactive to light and accommodation. Ears, Nose, Mouth, and Throat no gross abnormality of ear auricles or external auditory canals. normal hearing noted during conversation. mucus  membranes moist. Respiratory normal breathing without difficulty. Cardiovascular Mitchum, Sharise S. ([id number]) 2+ dorsalis pedis/posterior tibialis pulses. no clubbing, cyanosis, significant edema, Musculoskeletal normal gait and posture. no significant deformity or arthritic changes, no loss or range of motion, no clubbing. Psychiatric this patient is able to make decisions and demonstrates good insight into disease process. Alert and Oriented x 3. pleasant and cooperative. General Notes: Upon inspection patient's wound actually had significant eschar covering the wound which I discussed with the patient we really needed to see about performing a debridement on to clear away the eschar and necrotic debris. With that being said she did not want this to be removed at all for that reason there is really not much that I can do to help her with treatment of this area otherwise. I do think that this could be hiding something more significant although it is also possible it could be perfectly okay and she could heal just fine. The only way to know would be to remove it but again she is reluctant to do that due to the fact how bad it was hurting when it was open. Integumentary (Hair, Skin) Wound #1 status is Open. Original cause of wound was Trauma. The date acquired was: 04/23/2021. The wound is located on the Right,Anterior Lower Leg. The wound measures 3cm length x 4.6cm width x 0.1cm depth; 10.838cm^2 area and 1.084cm^3 volume. There is no tunneling or undermining noted. There is a none present amount of drainage noted. There is no granulation within the wound bed. There is a large (67-100%) amount of necrotic tissue within the wound bed including Eschar. Assessment Active Problems ICD-10 Laceration without foreign body, right lower leg, initial encounter Non-pressure chronic ulcer of other part of right lower leg with other specified severity Essential (primary) hypertension Chronic kidney  disease, stage 3 unspecified Plan Discharge From [facility] Services: Consult Only Edema Control - Lymphedema / Segmental Compressive Device / Other: Elevate, Exercise Daily and Avoid Standing for Long Periods of Time. Elevate legs to the level of the heart and pump ankles as often as possible Elevate leg(s) parallel to the floor when sitting. 1. At this point we will going to still consult. I am not can actually be seeing her ongoing currently. If anything changes and she decides she would  like for me to work on this removing the eschar and try to get this healed effectively for her she will contact the office and let me know for the time being she can use Betadine paint over top of this and then just leave it open to air. This is a consult only follow-up as needed. Electronic Signature(s) Signed: 05/10/2021 7:11:06 PM By: Worthy Keeler PA-C Entered By: Worthy Keeler on 05/10/2021 19:11:06 SHATASIA, CUTSHAW ([id number]) -------------------------------------------------------------------------------- ROS/PFSH Details Patient Name: Diane Gomez Date of Service: 05/10/2021 8:45 AM Medical Record Number: [id number] Patient Account Number: [id number] Date of Birth/Sex: Nov 14, 1955 (65 y.o. F) Treating RN: Carlene Coria Primary Care Provider: Eugenia Pancoast Other Clinician: Referring Provider: Eugenia Pancoast Treating Provider/Extender: Skipper Cliche in Treatment: 0 Information Obtained From Patient Eyes Complaints and Symptoms: Positive for: Vision Changes; Glasses / Contacts Integumentary (Skin) Complaints and Symptoms: Positive for: Wounds Cardiovascular Medical History: Positive for: Hypertension Genitourinary Complaints and Symptoms: Review of System Notes: CKD 3 Immunizations Pneumococcal Vaccine: Received Pneumococcal Vaccination: No Implantable Devices None Family and Social History Former smoker; Marital Status - Divorced; Alcohol Use: Never; Drug Use: No  History; Caffeine Use: Moderate; Financial Concerns: No; Food, Clothing or Shelter Needs: No; Support System Lacking: No; Transportation Concerns: No Electronic Signature(s) Signed: 05/10/2021 7:26:01 PM By: Worthy Keeler PA-C Signed: 05/11/2021 4:49:51 PM By: Carlene Coria RN Entered By: Carlene Coria on 05/10/2021 08:55:28 JAYLISE, PEEK ([id number]) -------------------------------------------------------------------------------- SuperBill Details Patient Name: Diane Gomez Date of Service: 05/10/2021 Medical Record Number: [id number] Patient Account Number: [id number] Date of Birth/Sex: [birth year] (65 y.o. F) Treating RN: Carlene Coria Primary Care Provider: Eugenia Pancoast Other Clinician: Referring Provider: Eugenia Pancoast Treating Provider/Extender: Skipper Cliche in Treatment: 0 Diagnosis Coding ICD-10 Codes Code Description [phone] Laceration without foreign body, right lower leg, initial encounter L97.818 Non-pressure chronic ulcer of other part of right lower leg with other specified severity I10 Essential (primary) hypertension N18.30 Chronic kidney disease, stage 3 unspecified Facility Procedures CPT4 Code: [id number] Description: 99214 - WOUND CARE VISIT-LEV 4 EST PT Modifier: Quantity: 1 Physician Procedures CPT4 Code: [id number] Description: WC PHYS LEVEL 3 o NEW PT Modifier: Quantity: 1 CPT4 Code: Description: ICD-10 Diagnosis Description S81.811A Laceration without foreign body, right lower leg, initial encounter L97.818 Non-pressure chronic ulcer of other part of right lower leg with other I10 Essential (primary) hypertension N18.30 Chronic  kidney disease, stage 3 unspecified Modifier: specified severity Quantity: Electronic Signature(s) Signed: 05/10/2021 7:11:19 PM By: Worthy Keeler PA-C Entered By: Worthy Keeler on 05/10/2021 19:11:19

## 2021-05-11 NOTE — Progress Notes (Signed)
TASHEBA, HENSON ([id number]) Visit Report for 05/10/2021 Abuse/Suicide Risk Screen Details Patient Name: Diane Gomez, Diane Gomez Date of Service: 05/10/2021 8:45 AM Medical Record Number: [id number] Patient Account Number: [id number] Date of Birth/Sex: 06/04/1955 (65 y.o. F) Treating RN: Carlene Coria Primary Care Reeder Brisby: Eugenia Pancoast Other Clinician: Referring Mayli Covington: Eugenia Pancoast Treating Leander Tout/Extender: Skipper Cliche in Treatment: 0 Abuse/Suicide Risk Screen Items Answer ABUSE RISK SCREEN: Has anyone close to you tried to hurt or harm you recentlyo No Do you feel uncomfortable with anyone in your familyo No Has anyone forced you do things that you didnot want to doo No Electronic Signature(s) Signed: 05/11/2021 4:49:51 PM By: Carlene Coria RN Entered By: Carlene Coria on 05/10/2021 08:55:37 Seabury, [name] ([id number]) -------------------------------------------------------------------------------- Activities of Daily Living Details Patient Name: Diane Gomez Date of Service: 05/10/2021 8:45 AM Medical Record Number: [id number] Patient Account Number: [id number] Date of Birth/Sex: [birth year] (65 y.o. F) Treating RN: Carlene Coria Primary Care Trevaun Rendleman: Eugenia Pancoast Other Clinician: Referring Trejon Duford: Eugenia Pancoast Treating Nikolos Billig/Extender: Skipper Cliche in Treatment: 0 Activities of Daily Living Items Answer Activities of Daily Living (Please select one for each item) Drive Automobile Completely Able Take Medications Completely Able Use Telephone Completely Able Care for Appearance Completely Able Use Toilet Completely Able Bath / Shower Completely Able Dress Self Completely Able Feed Self Completely Able Walk Completely Able Get In / Out Bed Completely Able Housework Completely Able Prepare Meals Completely [place] Completely Able Shop for Self Completely Able Electronic Signature(s) Signed: 05/11/2021 4:49:51 PM By: Carlene Coria RN Entered By: Carlene Coria on 05/10/2021 08:56:03 Diane Gomez ([id number]) -------------------------------------------------------------------------------- Education Screening Details Patient Name: Diane Gomez Date of Service: 05/10/2021 8:45 AM Medical Record Number: [id number] Patient Account Number: [id number] Date of Birth/Sex: Feb 23, 1956 (65 y.o. F) Treating RN: Carlene Coria Primary Care Caprice Wasko: Eugenia Pancoast Other Clinician: Referring Zaylie Gisler: Eugenia Pancoast Treating Sabena Winner/Extender: Skipper Cliche in Treatment: 0 Primary Learner Assessed: Patient Learning Preferences/Education Level/Primary Language Learning Preference: Explanation Highest Education Level: High School Preferred Language: English Cognitive Barrier Language Barrier: No Translator Needed: No Memory Deficit: No Emotional Barrier: No Cultural/Religious Beliefs Affecting Medical Care: No Physical Barrier Impaired Vision: Yes Glasses Impaired Hearing: No Decreased Hand dexterity: No Knowledge/Comprehension Knowledge Level: Medium Comprehension Level: High Ability to understand written instructions: High Ability to understand verbal instructions: High Motivation Anxiety Level: Anxious Cooperation: Cooperative Education Importance: Acknowledges Need Interest in Health Problems: Asks Questions Perception: Coherent Willingness to Engage in Self-Management Medium Activities: Readiness to Engage in Self-Management Medium Activities: Electronic Signature(s) Signed: 05/11/2021 4:49:51 PM By: Carlene Coria RN Entered By: Carlene Coria on 05/10/2021 08:56:36 Diane Gomez ([id number]) -------------------------------------------------------------------------------- Fall Risk Assessment Details Patient Name: Diane Gomez Date of Service: 05/10/2021 8:45 AM Medical Record Number: [id number] Patient Account Number: [id number] Date of Birth/Sex: 30-Jan-1956 (65 y.o.  F) Treating RN: Carlene Coria Primary Care Gustabo Gordillo: Eugenia Pancoast Other Clinician: Referring Pretty Weltman: Eugenia Pancoast Treating Saleha Kalp/Extender: Skipper Cliche in Treatment: 0 Fall Risk Assessment Items Have you had 2 or more falls in the last 12 monthso 0 No Have you had any fall that resulted in injury in the last 12 monthso 0 No FALLS RISK SCREEN History of falling - immediate or within 3 months 0 No Secondary diagnosis (Do you have 2 or more medical diagnoseso) 0 No Ambulatory aid None/bed rest/wheelchair/nurse 0 No Crutches/cane/walker 0 No Furniture 0 No Intravenous therapy Access/Saline/Heparin Lock 0 No Gait/Transferring Normal/ bed rest/ wheelchair 0 No Weak (short steps with  or without shuffle, stooped but able to lift head while walking, may 0 No seek support from furniture) Impaired (short steps with shuffle, may have difficulty arising from chair, head down, impaired 0 No balance) Mental Status Oriented to own ability 0 No Electronic Signature(s) Signed: 05/11/2021 4:49:51 PM By: Carlene Coria RN Entered By: Carlene Coria on 05/10/2021 08:56:52 Granberry, [name] ([id number]) -------------------------------------------------------------------------------- Foot Assessment Details Patient Name: Diane Gomez Date of Service: 05/10/2021 8:45 AM Medical Record Number: [id number] Patient Account Number: [id number] Date of Birth/Sex: 09-Dec-1955 (65 y.o. F) Treating RN: Carlene Coria Primary Care Catherene Kaleta: Eugenia Pancoast Other Clinician: Referring Brianna Esson: Eugenia Pancoast Treating Jarrod Mcenery/Extender: Skipper Cliche in Treatment: 0 Foot Assessment Items Site Locations + = Sensation present, - = Sensation absent, C = Callus, U = Ulcer R = Redness, W = Warmth, M = Maceration, PU = Pre-ulcerative lesion F = Fissure, S = Swelling, D = Dryness Assessment Right: Left: Other Deformity: No No Prior Foot Ulcer: No No Prior Amputation: No No Charcot Joint: No  No Ambulatory Status: Ambulatory Without Help Gait: Steady Electronic Signature(s) Signed: 05/11/2021 4:49:51 PM By: Carlene Coria RN Entered By: Carlene Coria on 05/10/2021 08:59:01 Diane Gomez ([id number]) -------------------------------------------------------------------------------- Nutrition Risk Screening Details Patient Name: Diane Gomez Date of Service: 05/10/2021 8:45 AM Medical Record Number: [id number] Patient Account Number: [id number] Date of Birth/Sex: March 05, 1956 (65 y.o. F) Treating RN: Carlene Coria Primary Care Fransheska Willingham: Eugenia Pancoast Other Clinician: Referring Amear Strojny: Eugenia Pancoast Treating Gid Schoffstall/Extender: Skipper Cliche in Treatment: 0 Height (in): Weight (lbs): Body Mass Index (BMI): Nutrition Risk Screening Items Score Screening NUTRITION RISK SCREEN: I have an illness or condition that made me change the kind and/or amount of food I eat 0 No I eat fewer than two meals per day 0 No I eat few fruits and vegetables, or milk products 0 No I have three or more drinks of beer, liquor or wine almost every day 0 No I have tooth or mouth problems that make it hard for me to eat 0 No I don't always have enough money to buy the food I need 0 No I eat alone most of the time 0 No I take three or more different prescribed or over-the-counter drugs a day 1 Yes Without wanting to, I have lost or gained 10 pounds in the last six months 2 Yes I am not always physically able to shop, cook and/or feed myself 0 No Nutrition Protocols Good Risk Protocol Moderate Risk Protocol 0 Provide education on nutrition High Risk Proctocol Risk Level: Moderate Risk Score: 3 Electronic Signature(s) Signed: 05/11/2021 4:49:51 PM By: Carlene Coria RN Entered By: Carlene Coria on 05/10/2021 08:57:14

## 2021-05-11 NOTE — Progress Notes (Signed)
Diane Gomez, Diane Gomez ([id number]) Visit Report for 05/10/2021 Allergy List Details Patient Name: Diane Gomez, Diane Gomez Date of Service: 05/10/2021 8:45 AM Medical Record Number: [id number] Patient Account Number: [id number] Date of Birth/Sex: 1956-05-14 (65 y.o. F) Treating RN: Diane Gomez Primary Care Diane Gomez: Diane Gomez Other Clinician: Referring Diane Gomez: Diane Gomez Treating Diane Gomez/Extender: Diane Gomez in Treatment: 0 Allergies Active Allergies ondansetron penicillin brimonidine shellfish derived Allergy Notes Electronic Signature(s) Signed: 05/11/2021 4:49:51 PM By: Diane Coria RN Entered By: Diane Gomez on 05/10/2021 08:53:27 Diane Gomez ([id number]) -------------------------------------------------------------------------------- Arrival Information Details Patient Name: Diane Gomez Date of Service: 05/10/2021 8:45 AM Medical Record Number: [id number] Patient Account Number: [id number] Date of Birth/Sex: 02-24-1956 (65 y.o. F) Treating RN: Diane Gomez Primary Care Diane Gomez: Diane Gomez Other Clinician: Referring Diane Gomez: Diane Gomez Treating Diane Gomez/Extender: Diane Gomez in Treatment: 0 Visit Information Patient Arrived: Ambulatory Arrival Time: 08:51 Accompanied By: self Transfer Assistance: None Patient Identification Verified: Yes Secondary Verification Process Completed: Yes Patient Requires Transmission-Based Precautions: No Patient Has Alerts: No Electronic Signature(s) Signed: 05/11/2021 4:49:51 PM By: Diane Coria RN Entered By: Diane Gomez on 05/10/2021 08:51:46 Diane Gomez ([id number]) -------------------------------------------------------------------------------- Clinic Level of Care Assessment Details Patient Name: Diane Gomez Date of Service: 05/10/2021 8:45 AM Medical Record Number: [id number] Patient Account Number: [id number] Date of Birth/Sex: 05/16/1956 (65 y.o. F) Treating RN: Diane Gomez Primary Care Diane Gomez: Diane Gomez Other Clinician: Referring Diane Gomez: Diane Gomez Treating Diane Gomez/Extender: Diane Gomez in Treatment: 0 Clinic Level of Care Assessment Items TOOL 2 Quantity Score X - Use when only an EandM is performed on the INITIAL visit 1 0 ASSESSMENTS - Nursing Assessment / Reassessment X - General Physical Exam (combine w/ comprehensive assessment (listed just below) when performed on new 1 20 pt. evals) X- 1 25 Comprehensive Assessment (HX, ROS, Risk Assessments, Wounds Hx, etc.) ASSESSMENTS - Wound and Skin Assessment / Reassessment X - Simple Wound Assessment / Reassessment - one wound 1 5 []  - 0 Complex Wound Assessment / Reassessment - multiple wounds []  - 0 Dermatologic / Skin Assessment (not related to wound area) ASSESSMENTS - Ostomy and/or Continence Assessment and Care []  - Incontinence Assessment and Management 0 []  - 0 Ostomy Care Assessment and Management (repouching, etc.) PROCESS - Coordination of Care X - Simple Patient / Family Education for ongoing care 1 15 []  - 0 Complex (extensive) Patient / Family Education for ongoing care X- 1 10 Staff obtains Programmer, systems, Records, Test Results / Process Orders []  - 0 Staff telephones HHA, Nursing Homes / Clarify orders / etc []  - 0 Routine Transfer to another Facility (non-emergent condition) []  - 0 Routine Hospital Admission (non-emergent condition) X- 1 15 New Admissions / Biomedical engineer / Ordering NPWT, Apligraf, etc. []  - 0 Emergency Hospital Admission (emergent condition) X- 1 10 Simple Discharge Coordination []  - 0 Complex (extensive) Discharge Coordination PROCESS - Special Needs []  - Pediatric / Minor Patient Management 0 []  - 0 Isolation Patient Management []  - 0 Hearing / Language / Visual special needs []  - 0 Assessment of Community assistance (transportation, D/C planning, etc.) []  - 0 Additional assistance / Altered mentation []  - 0 Support  Surface(s) Assessment (bed, cushion, seat, etc.) INTERVENTIONS - Wound Cleansing / Measurement X - Wound Imaging (photographs - any number of wounds) 1 5 []  - 0 Wound Tracing (instead of photographs) X- 1 5 Simple Wound Measurement - one wound []  - 0 Complex Wound Measurement - multiple wounds [name], [name] ([id number]) X- 1 5  Simple Wound Cleansing - one wound []  - 0 Complex Wound Cleansing - multiple wounds INTERVENTIONS - Wound Dressings []  - Small Wound Dressing one or multiple wounds 0 []  - 0 Medium Wound Dressing one or multiple wounds []  - 0 Large Wound Dressing one or multiple wounds []  - 0 Application of Medications - injection INTERVENTIONS - Miscellaneous []  - External ear exam 0 []  - 0 Specimen Collection (cultures, biopsies, blood, body fluids, etc.) []  - 0 Specimen(s) / Culture(s) sent or taken to Lab for analysis []  - 0 Patient Transfer (multiple staff / Civil Service fast streamer / Similar devices) []  - 0 Simple Staple / Suture removal (25 or less) []  - 0 Complex Staple / Suture removal (26 or more) []  - 0 Hypo / Hyperglycemic Management (close monitor of Blood Glucose) X- 1 15 Ankle / Brachial Index (ABI) - do not check if billed separately Has the patient been seen at the hospital within the last three years: Yes Total Score: 130 Level Of Care: New/Established - Level 4 Electronic Signature(s) Signed: 05/11/2021 4:49:51 PM By: Diane Coria RN Entered By: Diane Gomez on 05/10/2021 10:08:25 Diane Gomez ([id number]) -------------------------------------------------------------------------------- Lower Extremity Assessment Details Patient Name: Diane Gomez Date of Service: 05/10/2021 8:45 AM Medical Record Number: [id number] Patient Account Number: [id number] Date of Birth/Sex: 10-29-1955 (65 y.o. F) Treating RN: Diane Gomez Primary Care Diane Gomez: Diane Gomez Other Clinician: Referring Diane Gomez: Diane Gomez Treating Aneesh Faller/Extender:  Diane Gomez in Treatment: 0 Edema Assessment Assessed: [Left: No] [Right: No] Edema: [Left: Ye] [Right: s] Calf Left: Right: Point of Measurement: 30 cm From Medial Instep 29 cm Ankle Left: Right: Point of Measurement: 9 cm From Medial Instep 19 cm Knee To Floor Left: Right: From Medial Instep 40 cm Vascular Assessment Pulses: Dorsalis Pedis Palpable: [Right:Yes] Doppler Audible: [Right:Yes] Blood Pressure: Brachial: [Right:161] Ankle: [Right:Dorsalis Pedis: 194 1.20] Electronic Signature(s) Signed: 05/11/2021 4:49:51 PM By: Diane Coria RN Entered By: Diane Gomez on 05/10/2021 09:07:28 Diane Gomez ([id number]) -------------------------------------------------------------------------------- Multi Wound Chart Details Patient Name: Diane Gomez Date of Service: 05/10/2021 8:45 AM Medical Record Number: [id number] Patient Account Number: [id number] Date of Birth/Sex: 03/20/1956 (65 y.o. F) Treating RN: Diane Gomez Primary Care Lari Linson: Diane Gomez Other Clinician: Referring Limmie Schoenberg: Diane Gomez Treating Cuauhtemoc Huegel/Extender: Diane Gomez in Treatment: 0 Vital Signs Height(in): Pulse(bpm): 79 Weight(lbs): Blood Pressure(mmHg): 161/79 Body Mass Index(BMI): Temperature(F): 98.5 Respiratory Rate(breaths/min): 16 Photos: [1:No Photos] [N/A:N/A] Wound Location: [1:Right, Anterior Lower Leg] [N/A:N/A] Wounding Event: [1:Trauma] [N/A:N/A] Primary Etiology: [1:Trauma, Other] [N/A:N/A] Comorbid History: [1:Hypertension] [N/A:N/A] Date Acquired: [1:04/23/2021] [N/A:N/A] Weeks of Treatment: [1:0] [N/A:N/A] Wound Status: [1:Open] [N/A:N/A] Measurements L x W x D (cm) [1:3x4.6x0.1] [N/A:N/A] Area (cm) : [1:10.838] [N/A:N/A] Volume (cm) : [1:1.084] [N/A:N/A] Classification: [1:Full Thickness Without Exposed Support Structures] [N/A:N/A] Exudate Amount: [1:None Present] [N/A:N/A] Granulation Amount: [1:None Present (0%)] [N/A:N/A] Necrotic  Amount: [1:Large (67-100%)] [N/A:N/A] Necrotic Tissue: [1:Eschar] [N/A:N/A] Exposed Structures: [1:Fascia: No Fat Layer (Subcutaneous Tissue): No Tendon: No Muscle: No Joint: No Bone: No None] [N/A:N/A N/A] Treatment Notes Electronic Signature(s) Signed: 05/11/2021 4:49:51 PM By: Diane Coria RN Entered By: Diane Gomez on 05/10/2021 10:02:59 Diane Gomez ([id number]) -------------------------------------------------------------------------------- Multi-Disciplinary Care Plan Details Patient Name: Diane Gomez Date of Service: 05/10/2021 8:45 AM Medical Record Number: [id number] Patient Account Number: [id number] Date of Birth/Sex: 16-Aug-1955 (65 y.o. F) Treating RN: Diane Gomez Primary Care Jmarion Christiano: Diane Gomez Other Clinician: Referring Cordero Surette: Diane Gomez Treating Merrel Crabbe/Extender: Diane Gomez in Treatment: 0 Active Inactive Electronic Signature(s) Signed: 05/11/2021 4:49:51  PM By: Diane Coria RN Entered By: Diane Gomez on 05/10/2021 10:09:24 Diane Gomez ([id number]) -------------------------------------------------------------------------------- Pain Assessment Details Patient Name: Diane Gomez Date of Service: 05/10/2021 8:45 AM Medical Record Number: [id number] Patient Account Number: [id number] Date of Birth/Sex: 19-Sep-1955 (65 y.o. F) Treating RN: Diane Gomez Primary Care Raziyah Vanvleck: Diane Gomez Other Clinician: Referring Soraiya Ahner: Diane Gomez Treating Aviyanna Colbaugh/Extender: Diane Gomez in Treatment: 0 Active Problems Location of Pain Severity and Description of Pain Patient Has Paino No Site Locations Pain Management and Medication Current Pain Management: Electronic Signature(s) Signed: 05/11/2021 4:49:51 PM By: Diane Coria RN Entered By: Diane Gomez on 05/10/2021 08:52:08 Diane Gomez ([id number]) -------------------------------------------------------------------------------- Patient/Caregiver  Education Details Patient Name: Diane Gomez Date of Service: 05/10/2021 8:45 AM Medical Record Number: [id number] Patient Account Number: [id number] Date of Birth/Gender: [birth year] (65 y.o. F) Treating RN: Diane Gomez Primary Care Physician: Diane Gomez Other Clinician: Referring Physician: Eugenia Gomez Treating Physician/Extender: Diane Gomez in Treatment: 0 Education Assessment Education Provided To: Patient Education Topics Provided Wound/Skin Impairment: Methods: Explain/Verbal Responses: State content correctly Electronic Signature(s) Signed: 05/11/2021 4:49:51 PM By: Diane Coria RN Entered By: Diane Gomez on 05/10/2021 10:08:46 Diane Gomez ([id number]) -------------------------------------------------------------------------------- Wound Assessment Details Patient Name: Diane Gomez Date of Service: 05/10/2021 8:45 AM Medical Record Number: [id number] Patient Account Number: [id number] Date of Birth/Sex: Jun 05, 1955 (65 y.o. F) Treating RN: Diane Gomez Primary Care Mcdonald Reiling: Diane Gomez Other Clinician: Referring April Colter: Diane Gomez Treating Maikayla Beggs/Extender: Diane Gomez in Treatment: 0 Wound Status Wound Number: 1 Primary Etiology: Trauma, Other Wound Location: Right, Anterior Lower Leg Wound Status: Open Wounding Event: Trauma Comorbid History: Hypertension Date Acquired: 04/23/2021 Weeks Of Treatment: 0 Clustered Wound: No Wound Measurements Length: (cm) 3 Width: (cm) 4.6 Depth: (cm) 0.1 Area: (cm) 10.838 Volume: (cm) 1.084 % Reduction in Area: % Reduction in Volume: Epithelialization: None Tunneling: No Undermining: No Wound Description Classification: Full Thickness Without Exposed Support Structure Exudate Amount: None Present s Foul Odor After Cleansing: No Slough/Fibrino Yes Wound Bed Granulation Amount: None Present (0%) Exposed Structure Necrotic Amount: Large (67-100%) Fascia Exposed:  No Necrotic Quality: Eschar Fat Layer (Subcutaneous Tissue) Exposed: No Tendon Exposed: No Muscle Exposed: No Joint Exposed: No Bone Exposed: No Electronic Signature(s) Signed: 05/11/2021 4:49:51 PM By: Diane Coria RN Entered By: Diane Gomez on 05/10/2021 09:05:17 Diane Gomez ([id number]) -------------------------------------------------------------------------------- Vitals Details Patient Name: Diane Gomez Date of Service: 05/10/2021 8:45 AM Medical Record Number: [id number] Patient Account Number: [id number] Date of Birth/Sex: July 17, 1955 (65 y.o. F) Treating RN: Diane Gomez Primary Care Detrice Cales: Diane Gomez Other Clinician: Referring Jamyiah Labella: Diane Gomez Treating Romey Cohea/Extender: Diane Gomez in Treatment: 0 Vital Signs Time Taken: 08:52 Temperature (F): 98.5 Pulse (bpm): 65 Respiratory Rate (breaths/min): 16 Blood Pressure (mmHg): 161/79 Reference Range: 80 - 120 mg / dl Electronic Signature(s) Signed: 05/11/2021 4:49:51 PM By: Diane Coria RN Entered By: Diane Gomez on 05/10/2021 08:52:33

## 2021-05-23 ENCOUNTER — Ambulatory Visit: Payer: Medicare Other | Admitting: Internal Medicine

## 2021-05-23 ENCOUNTER — Encounter: Payer: Medicare Other | Admitting: Nurse Practitioner

## 2021-06-25 ENCOUNTER — Ambulatory Visit: Payer: Medicare Other | Admitting: Gastroenterology

## 2021-06-25 ENCOUNTER — Encounter: Payer: Self-pay | Admitting: Gastroenterology

## 2021-06-25 VITALS — BP 122/72 | HR 73 | Ht 61.0 in | Wt 114.1 lb

## 2021-06-25 DIAGNOSIS — K580 Irritable bowel syndrome with diarrhea: Secondary | ICD-10-CM

## 2021-06-25 DIAGNOSIS — Z91018 Allergy to other foods: Secondary | ICD-10-CM

## 2021-06-25 NOTE — Progress Notes (Signed)
Diane Gomez    [id number]    1955-12-14  Primary Care Physician:Dugal, Lawerance Bach, [place]  Referring Physician: Jearld Fenton, NP [address] [place],  [place] [address]   Chief complaint:  IBS-Diarrhea  HPI:  66 year old very pleasant female here with complaints of chronic diarrhea.  She is using Robinul as needed with improvement of abdominal cramping.  She had an episode of blurred vision once, she will continue to monitor closely.  She has a follow-up visit appointment with ophthalmologist   Overall she feels diarrhea has improved with daily budesonide, she is having formed 1-2 bowel movements daily, she is experiencing intermittent constipation and has not had a bowel movement in the past 2 to 3 days. Denies any rectal bleeding.     Colonoscopy with biopsies negative for microscopic colitis, had mild ischemic colitis changes.  She was empirically started on budesonide 9 mg daily, has history of alpha gal allergy.  She is avoiding red meat.  She was also lactose intolerant, loves cheese and given her diet is already restricted she finds it hard to avoid milk or dairy products.   Colonoscopy 12/21/2020 - The perianal and digital rectal examinations were normal. - A few <five mm ulcers were found in the sigmoid colon. No bleeding was present. Biopsies were taken with a cold forceps for histology. - Scattered small and large-mouthed diverticula were found in the sigmoid colon and descending colon. There was narrowing of the colon in association with the diverticular opening. There was evidence of diverticular spasm. Peri-diverticular erythema was seen. - Segmental and patchy mild inflammation characterized by altered vascularity, congestion (edema), erosions, erythema, mucus and shallow ulcerations was found in the sigmoid colon and in the descending colon. Biopsies were taken with a cold forceps for histology. - The terminal ileum appeared normal. Biopsies were taken with  a cold forceps for histology. Biopsies for histology were taken with a cold forceps from the right colon and left colon for evaluation of microscopic colitis. - Non-bleeding external and internal hemorrhoids were found during retroflexion. The hemorrhoids were small.   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   Ongoing EtOH use.  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 With Recommendation for Pelvic floor retraining with continence strategies and correction of dyssynergic defecation. Pelvic heath therapy referral was placed. Unfortunately, she states that [facility] no longer accepts her insurance.     Colonoscopy December 11, 2016 - The perianal and digital rectal examinations were normal. - A 5 mm polyp was found in the transverse colon. The polyp was sessile. The polyp was removed with a cold snare. Resection and retrieval were complete.--Tubular adenoma - A 10 mm polyp was found in the sigmoid colon. The polyp was  sessile. The polyp was removed with a hot snare. Resection and retrieval were complete.---Hyperplastic - Multiple small and large-mouthed diverticula were found in the sigmoid colon and descending colon. - Non-bleeding internal hemorrhoids were found during retroflexion. The hemorrhoids  were Small. Recommended recall colonoscopy in 5 years   EGD April 13, 2017: Unremarkable   MRI abdomen with and without contrast May 01, 2017 [place] atherosclerosis otherwise benign lesions noted bilateral kidney, adrenal and in the liver.  Hepatic steatosis. No further imaging was recommended for follow-up.   CT abdomen pelvis April 22, 2017 1. There is a new abnormal 2.6 cm hypodense lesion in segment 8 of the liver. Possibilities might include focal fatty infiltration, unusual hemangioma, or malignancy. Hepatic protocol MRI is recommended for further workup. 2. Small lesion in segment 6 of the liver was probably present in 2008 but obscured by streak artifact. Long-term persistence indicates a likely benign lesion. 3. Nonspecific 1.3 cm nodule in the left adrenal gland. This could also be further assessed at the time of MRI. 4. 6 mm left mid kidney calculus within a small calyceal diverticulum below a region of renal scarring. 5. New or enlarged 1.1 by 0.8 cm hypodense lesion in the left mid to lower kidney, probably a cyst but technically nonspecific due to small size. This also could be further characterized at the time of MRI. 6. Scattered descending and sigmoid colon diverticula without active diverticulitis. 7.  Aortic Atherosclerosis      Outpatient Encounter Medications as of 06/25/2021  Medication Sig   albuterol (VENTOLIN HFA) 108 (90 Base) MCG/ACT inhaler Inhale 2 puffs into the lungs every 6 (six) hours as needed for wheezing or shortness of breath.   ALPRAZolam (XANAX) 0.5 MG tablet Take 0.5 mg by mouth at bedtime as needed for anxiety.   diltiazem (TIAZAC) 300 MG 24 hr capsule Take 180 mg by mouth daily.   esomeprazole (NEXIUM) 20 MG capsule Take 1 capsule (20 mg total) by mouth every other day.   metoprolol succinate (TOPROL-XL) 50 MG 24 hr tablet Take 1 tablet by mouth daily.    Probiotic Product (PROBIOTIC-10 PO) Take by mouth.   No  facility-administered encounter medications on file as of 06/25/2021.    Allergies as of 06/25/2021 - Review Complete 05/03/2021  Allergen Reaction Noted   Ondansetron  12/11/2017   Penicillins Hives 10/04/2010   Brimonidine  05/07/2018   Meat [alpha-gal]  12/03/2018   Other  12/11/2016   Shellfish allergy  12/03/2018    Past Medical History:  Diagnosis Date   Alcohol abuse, uncomplicated 9/67/8938   Allergy    Alpha galactosidase deficiency    Anemia    Anxiety    Asthma    from cats and horses only   Cancer ([place]) 15 years ago   melanoma    Fibroid    SUBMUCOUS MYOMA   GERD (gastroesophageal reflux disease)    Heart murmur    History of nonmelanoma skin cancer 06/27/2014   Formatting of this note might be different from the original. Formatting of this note may be different from the original. Skin Cancer History- Non-Melanoma Skin Cancer  Diagnosis Location Biopsy Date Treatment date Procedure Surgeon  [facility] Left temple, scalp  2008 [facility] [place]  [facility] Left temple  2010    [facility] Left neck  2001    [place]  Right upper lip  2007 excision   [facility]  L upper chest  2007 [facility]   [facility]   Hypertension    Osteoporosis  06/2017   T score -2.6   Tumors    liver    Past Surgical History:  Procedure Laterality Date   CESAREAN SECTION     ENDOMETRIAL ABLATION  2009   HER OPTION   HYSTEROSCOPY  2008   MYOMECTOMY   TUBAL LIGATION      Family History  Problem Relation Age of Onset   Hypertension Mother    Parkinsonism Sister        PARKINSON'S DISEASE   Breast cancer Other        Niece  Age 25   Aneurysm Father    Seizures Sister    Heart disease Brother    Colon cancer Neg Hx    Stomach cancer Neg Hx    Esophageal cancer Neg Hx     Social History   Socioeconomic History   Marital status: Divorced    Spouse name: Not on file   Number of children: Not on file   Years of education: Not on file   Highest education level: Not on file  Occupational History   Not on file  Tobacco Use    Smoking status: Former    Types: Cigarettes   Smokeless tobacco: Never  Vaping Use   Vaping Use: Never used  Substance and Sexual Activity   Alcohol use: Yes    Alcohol/week: 14.0 standard drinks    Types: 7 Shots of liquor, 7 Standard drinks or equivalent per week    Comment: moderate..."at least one drink per day" Vodka per pt   Drug use: Yes    Frequency: 2.0 times per week    Types: Marijuana    Comment: occasional    Sexual activity: Not Currently    Birth control/protection: Post-menopausal, Surgical  Other Topics Concern   Not on file  Social History Narrative   Not on file   Social Determinants of Health   Financial Resource Strain: Not on file  Food Insecurity: Not on file  Transportation Needs: Not on file  Physical Activity: Not on file  Stress: Not on file  Social Connections: Not on file  Intimate Partner Violence: Not on file      Review of systems: All other review of systems negative except as mentioned in the HPI.   Physical Exam: Vitals:   06/25/21 1048  BP: 122/72  Pulse: 73  SpO2: 97%   Body mass index is 21.56 kg/m. Gen:      No acute distress HEENT:  sclera anicteric Abd:      soft, non-tender; no palpable masses, no distension Ext:    No edema Neuro: alert and oriented x 3 Psych: normal mood and affect  Data Reviewed:  Reviewed labs, radiology imaging, old records and pertinent past GI work up   Assessment and Plan/Recommendations:  66 year old very pleasant female with chronic diarrhea, alpha gal allergy  Chronic diarrhea:  GI pathogen panel, O&P, fecal fat, fecal elastase and celiac panel negative  Colonoscopy with biopsies negative for microscopic colitis  She had mild ischemic colitis  Overall symptoms likely secondary to irritable bowel syndrome predominant diarrhea  Advised patient to hold using Robinul until she has follow-up with ophthalmology, she had an episode of blurred vision.  She has elevated alpha gal  antibody, advised patient to continue to avoid red meat .   Severe hyponatremia: Likely secondary to excessive alcohol use (Beer potomania)  Discussed alcohol cessation  Managed by nephrology   Chronic kidney disease: Managed by nephrology  She has history of adrenal  nodule followed by nephrology and PMD   Return as needed  The patient was provided an opportunity to ask questions and all were answered. The patient agreed with the plan and demonstrated an understanding of the instructions.  Damaris Hippo , MD    CC: Jearld Fenton, NP

## 2021-06-25 NOTE — Patient Instructions (Signed)
STOP Robinul  Follow up as needed  If you are age 68 or older, your body mass index should be between 23-30. Your Body mass index is 21.56 kg/m. If this is out of the aforementioned range listed, please consider follow up with your Primary Care Provider.  If you are age 56 or younger, your body mass index should be between 19-25. Your Body mass index is 21.56 kg/m. If this is out of the aformentioned range listed, please consider follow up with your Primary Care Provider.   ________________________________________________________  The [facility] providers would like to encourage you to use [facility] to communicate with providers for non-urgent requests or questions.  Due to long hold times on the telephone, sending your provider a message by [facility] may be a faster and more efficient way to get a response.  Please allow 48 business hours for a response.  Please remember that this is for non-urgent requests.  _______________________________________________________   I appreciate the  opportunity to care for you  Thank You   Harl Bowie , MD

## 2021-07-12 ENCOUNTER — Encounter: Payer: Self-pay | Admitting: Gastroenterology

## 2021-07-18 ENCOUNTER — Ambulatory Visit (INDEPENDENT_AMBULATORY_CARE_PROVIDER_SITE_OTHER): Payer: Medicare Other | Admitting: Family

## 2021-07-18 ENCOUNTER — Encounter: Payer: Self-pay | Admitting: Family

## 2021-07-18 ENCOUNTER — Other Ambulatory Visit: Payer: Self-pay

## 2021-07-18 VITALS — BP 122/78 | HR 64 | Temp 98.7°F | Ht 61.0 in | Wt 116.0 lb

## 2021-07-18 DIAGNOSIS — R053 Chronic cough: Secondary | ICD-10-CM

## 2021-07-18 DIAGNOSIS — I10 Essential (primary) hypertension: Secondary | ICD-10-CM | POA: Diagnosis not present

## 2021-07-18 DIAGNOSIS — Z91018 Allergy to other foods: Secondary | ICD-10-CM

## 2021-07-18 DIAGNOSIS — I700 Atherosclerosis of aorta: Secondary | ICD-10-CM | POA: Insufficient documentation

## 2021-07-18 DIAGNOSIS — K219 Gastro-esophageal reflux disease without esophagitis: Secondary | ICD-10-CM | POA: Diagnosis not present

## 2021-07-18 DIAGNOSIS — N1831 Chronic kidney disease, stage 3a: Secondary | ICD-10-CM

## 2021-07-18 NOTE — Patient Instructions (Signed)
Start flonase once daily and also take a daily antihistamine such as claritin or allegra.  If no improvement in symptoms in the next few weeks will consider chest xray and also daily maintenance inhaler because I don't want you on albuterol inhaler.   It was a pleasure seeing you today! Please do not hesitate to reach out with any questions and or concerns.  Regards,   Eugenia Pancoast FNP-C

## 2021-07-19 DIAGNOSIS — R053 Chronic cough: Secondary | ICD-10-CM | POA: Insufficient documentation

## 2021-07-19 NOTE — Assessment & Plan Note (Signed)
Continue f/u and management with nephrologist as scheduled.

## 2021-07-19 NOTE — Progress Notes (Signed)
Established Patient Office Visit  Subjective:  Patient ID: Diane Gomez, female    DOB: [birth year]  Age: 66 y.o. MRN: [id number]  CC:  Chief Complaint  Patient presents with   Leg Pain    Left right frontal--pt stated --leg is looking much better.    HPI Diane Gomez is here today for follow up.  Pt is without acute concerns.   Right lower shin wound: completed healed over. No longer with pain or drainage.    Alpha gal: with antibody recent f/u with GI Dr. Silverio Decamp 06/25/21, pt to continue to avoid meat. Has epipen if needed. With mild ischemic colitis. Budesonide helps with diarrhea.    CKD: followed by Dr.Korrapati, Nephrology. H/o adrenal nodule as well. Improvement in hyponatremia with remission of alcohol. Following proteinuria as well which is mild.    Chronic itching has resolved. Was due to peach allergy.    HTN: metoprolol 50 mg twice daily, tolerating well. Also on diltiazem 240 mg once daily.    GERD: nexium 20 mg every other day. No break through heartburn since limiting meat.    Cough: dry non productive cough ongoing for years, only in the mornings. She does take albuterol inhaler every morning and then finds relief. Has tried flonase in the past but never took it enough. Does smoke marijuana daily for at the last three years. Was a cigarette smoker, daily for about 8 pack years. CXR 04/08/21 no acute cardiopulmonary disease.   Past Medical History:  Diagnosis Date   Alcohol abuse, uncomplicated 0/35/4656   Allergy    Alpha galactosidase deficiency    Anemia    Anxiety    Asthma    from cats and horses only   Cancer ([place]) 15 years ago   melanoma    Fibroid    SUBMUCOUS MYOMA   GERD (gastroesophageal reflux disease)    Heart murmur    History of nonmelanoma skin cancer 06/27/2014   Formatting of this note might be different from the original. Formatting of this note may be different from the original. Skin Cancer History- Non-Melanoma Skin Cancer   Diagnosis Location Biopsy Date Treatment date Procedure Surgeon  [facility] Left temple, scalp  2008 ED&C [place]  [facility] Left temple  2010    [facility] Left neck  2001    [place]  Right upper lip  2007 excision   [facility]  L upper chest  2007 ED&C   [facility]   Hypertension    Osteoporosis 06/2017   T score -2.6   Tumors    liver    Past Surgical History:  Procedure Laterality Date   CESAREAN SECTION     ENDOMETRIAL ABLATION  2009   HER OPTION   HYSTEROSCOPY  2008   MYOMECTOMY   TUBAL LIGATION      Family History  Problem Relation Age of Onset   Hypertension Mother    Parkinsonism Sister        PARKINSON'S DISEASE   Breast cancer Other        Niece  Age 13   Aneurysm Father    Seizures Sister    Heart disease Brother    Colon cancer Neg Hx    Stomach cancer Neg Hx    Esophageal cancer Neg Hx     Social History   Socioeconomic History   Marital status: Divorced    Spouse name: Not on file   Number of children: Not on file   Years of education: Not on file  Highest education level: Not on file  Occupational History   Not on file  Tobacco Use   Smoking status: Former    Types: Cigarettes   Smokeless tobacco: Never  Vaping Use   Vaping Use: Never used  Substance and Sexual Activity   Alcohol use: Yes    Alcohol/week: 14.0 standard drinks    Types: 7 Shots of liquor, 7 Standard drinks or equivalent per week    Comment: moderate..."at least one drink per day" Vodka per pt   Drug use: Yes    Frequency: 2.0 times per week    Types: Marijuana    Comment: occasional    Sexual activity: Not Currently    Birth control/protection: Post-menopausal, Surgical  Other Topics Concern   Not on file  Social History Narrative   Not on file   Social Determinants of Health   Financial Resource Strain: Not on file  Food Insecurity: Not on file  Transportation Needs: Not on file  Physical Activity: Not on file  Stress: Not on file  Social Connections: Not on file  Intimate Partner Violence: Not  on file    Outpatient Medications Prior to Visit  Medication Sig Dispense Refill   albuterol (VENTOLIN HFA) 108 (90 Base) MCG/ACT inhaler Inhale 2 puffs into the lungs every 6 (six) hours as needed for wheezing or shortness of breath. 18 g 0   ALPRAZolam (XANAX) 0.5 MG tablet Take 0.5 mg by mouth at bedtime as needed for anxiety.     diltiazem (TIAZAC) 240 MG 24 hr capsule Take 240 mg by mouth daily.     esomeprazole (NEXIUM) 20 MG capsule Take 20 mg by mouth daily.     metoprolol succinate (TOPROL-XL) 50 MG 24 hr tablet Take 50 mg by mouth 2 (two) times daily. Take with or immediately following a meal.     NON FORMULARY Balance of Nature-Fruits and Veggies 3 tablets daily     Probiotic Product (PROBIOTIC-10 PO) Take 1 tablet by mouth daily.     glycopyrrolate (ROBINUL) 1 MG tablet Take 1 tablet by mouth in the morning and at bedtime.     No facility-administered medications prior to visit.    Allergies  Allergen Reactions   Ondansetron     Bilateral eye swelling without breathing changes    Penicillins Hives   Prunus Persica Hives    PEACHES   Brimonidine     Bit by a tick    Meat [Alpha-Gal]    Other     Cats and horses   Shellfish Allergy     ROS Review of Systems  Constitutional:  Negative for chills and fatigue.  Respiratory:  Negative for cough and shortness of breath.   Cardiovascular:  Negative for chest pain and leg swelling.  Gastrointestinal:  Negative for diarrhea and nausea.  Genitourinary:  Negative for difficulty urinating.  Skin:  Positive for wound (healed over with mild red scar).  Psychiatric/Behavioral:  Negative for agitation and sleep disturbance.   All other systems reviewed and are negative.      Objective:    Physical Exam Vitals reviewed.  Constitutional:      General: She is not in acute distress.    Appearance: Normal appearance. She is not ill-appearing or toxic-appearing.  HENT:     Mouth/Throat:     Pharynx: No pharyngeal  swelling.     Tonsils: No tonsillar exudate.  Neck:     Thyroid: No thyroid mass.  Cardiovascular:     Rate and  Rhythm: Normal rate and regular rhythm.  Pulmonary:     Effort: Pulmonary effort is normal.     Breath sounds: Normal breath sounds.  Musculoskeletal:        General: Normal range of motion.  Lymphadenopathy:     Cervical:     Right cervical: No superficial cervical adenopathy.    Left cervical: No superficial cervical adenopathy.  Skin:    General: Skin is warm.     Capillary Refill: Capillary refill takes less than 2 seconds.  Neurological:     General: No focal deficit present.     Mental Status: She is alert and oriented to person, place, and time.  Psychiatric:        Mood and Affect: Mood normal.        Behavior: Behavior normal.        Thought Content: Thought content normal.        Judgment: Judgment normal.     BP 122/78    Pulse 64    Temp 98.7 F (37.1 C)    Ht 5\' 1"  (1.549 m)    Wt 116 lb (52.6 kg)    SpO2 98%    BMI 21.92 kg/m  Wt Readings from Last 3 Encounters:  07/18/21 116 lb (52.6 kg)  06/25/21 114 lb 2 oz (51.8 kg)  05/03/21 117 lb (53.1 kg)     Health Maintenance Due  Topic Date Due   Hepatitis C Screening  Never done   Zoster Vaccines- Shingrix (1 of 2) Never done   COVID-19 Vaccine (3 - Pfizer risk series) 06/07/2020    There are no preventive care reminders to display for this patient.  Lab Results  Component Value Date   TSH 0.622 07/08/2013   Lab Results  Component Value Date   WBC 8.9 04/08/2021   HGB 15.5 (H) 04/08/2021   HCT 43.3 04/08/2021   MCV 92.9 04/08/2021   PLT 341 04/08/2021   Lab Results  Component Value Date   NA 125 (L) 04/08/2021   K 4.0 04/08/2021   CO2 18 (L) 04/08/2021   GLUCOSE 190 (H) 04/08/2021   BUN 14 04/08/2021   CREATININE 1.29 (H) 04/08/2021   BILITOT 0.4 10/16/2020   ALKPHOS 71 10/16/2020   AST 19 10/16/2020   ALT 11 10/16/2020   PROT 8.0 10/16/2020   ALBUMIN 4.7 10/16/2020    CALCIUM 9.8 04/08/2021   ANIONGAP 13 04/08/2021   GFR 55.87 (L) 10/16/2020   Lab Results  Component Value Date   CHOL 256 (H) 12/03/2018   Lab Results  Component Value Date   HDL 94.00 12/03/2018   Lab Results  Component Value Date   LDLCALC 127 (H) 02/18/2017   Lab Results  Component Value Date   TRIG 376.0 (H) 12/03/2018   Lab Results  Component Value Date   CHOLHDL 3 12/03/2018   Lab Results  Component Value Date   HGBA1C 5.6 04/17/2021      Assessment & Plan:   Problem List Items Addressed This Visit       Cardiovascular and Mediastinum   Hypertension    Continue metoprolol and diltazem as prescribed      Atherosclerosis of aorta (HCC) - Primary     Digestive   Gastroesophageal reflux disease    nexium can contribute to osteoporosis, would like to attempt to wean off in near future.  Try to decrease and or avoid spicy foods, fried fatty foods, and also caffeine and chocolate as these can increase  heartburn symptoms.          Genitourinary   Chronic kidney disease, stage 3a ([place])    Continue f/u and management with nephrologist as scheduled.       Cyst of kidney, acquired     Other   Allergy to alpha-gal    Continue to have epi pen on her at all times.  Avoid mammalian meats as to not exacerbate symptoms.       Chronic cough    Recommend that pt wean down or d/c marijuana daily as this is also inhalation and can contribute to chronic cough. Recommended flonase daily. Will consider low dose CT if meets criteria next visit.        No orders of the defined types were placed in this encounter.   Follow-up: Return in about 2 weeks (around 08/01/2021).    Eugenia Pancoast, FNP

## 2021-07-19 NOTE — Assessment & Plan Note (Signed)
Continue to have epi pen on her at all times.  Avoid mammalian meats as to not exacerbate symptoms.

## 2021-07-19 NOTE — Assessment & Plan Note (Signed)
nexium can contribute to osteoporosis, would like to attempt to wean off in near future.  Try to decrease and or avoid spicy foods, fried fatty foods, and also caffeine and chocolate as these can increase heartburn symptoms.

## 2021-07-19 NOTE — Assessment & Plan Note (Signed)
Recommend that pt wean down or d/c marijuana daily as this is also inhalation and can contribute to chronic cough. Recommended flonase daily. Will consider low dose CT if meets criteria next visit.

## 2021-07-19 NOTE — Assessment & Plan Note (Signed)
Continue metoprolol and diltazem as prescribed

## 2021-08-12 ENCOUNTER — Other Ambulatory Visit: Payer: Self-pay

## 2021-08-12 ENCOUNTER — Emergency Department: Payer: Medicare Other

## 2021-08-12 ENCOUNTER — Emergency Department
Admission: EM | Admit: 2021-08-12 | Discharge: 2021-08-12 | Disposition: A | Discharge status: Home / Self Care | Payer: Medicare Other | Attending: Emergency Medicine | Admitting: Emergency Medicine

## 2021-08-12 DIAGNOSIS — J45909 Unspecified asthma, uncomplicated: Secondary | ICD-10-CM | POA: Diagnosis not present

## 2021-08-12 DIAGNOSIS — Z85828 Personal history of other malignant neoplasm of skin: Secondary | ICD-10-CM | POA: Diagnosis not present

## 2021-08-12 DIAGNOSIS — Z8505 Personal history of malignant neoplasm of liver: Secondary | ICD-10-CM | POA: Insufficient documentation

## 2021-08-12 DIAGNOSIS — N1831 Chronic kidney disease, stage 3a: Secondary | ICD-10-CM | POA: Insufficient documentation

## 2021-08-12 DIAGNOSIS — R0781 Pleurodynia: Secondary | ICD-10-CM | POA: Diagnosis not present

## 2021-08-12 DIAGNOSIS — R0602 Shortness of breath: Secondary | ICD-10-CM | POA: Insufficient documentation

## 2021-08-12 DIAGNOSIS — I129 Hypertensive chronic kidney disease with stage 1 through stage 4 chronic kidney disease, or unspecified chronic kidney disease: Secondary | ICD-10-CM | POA: Diagnosis not present

## 2021-08-12 DIAGNOSIS — R911 Solitary pulmonary nodule: Secondary | ICD-10-CM | POA: Diagnosis not present

## 2021-08-12 DIAGNOSIS — R079 Chest pain, unspecified: Secondary | ICD-10-CM | POA: Diagnosis not present

## 2021-08-12 LAB — BASIC METABOLIC PANEL
Anion gap: 11 (ref 5–15)
BUN: 11 mg/dL (ref 8–23)
CO2: 23 mmol/L (ref 22–32)
Calcium: 9.7 mg/dL (ref 8.9–10.3)
Chloride: 100 mmol/L (ref 98–111)
Creatinine, Ser: 0.8 mg/dL (ref 0.44–1.00)
GFR, Estimated: >60 mL/min (ref >60)
Glucose, Bld: 114 mg/dL — ABNORMAL HIGH (ref 70–99)
Potassium: 3.9 mmol/L (ref 3.5–5.1)
Sodium: 134 mmol/L — ABNORMAL LOW (ref 135–145)

## 2021-08-12 LAB — CBC
HCT: 40.8 % (ref 36.0–46.0)
Hemoglobin: 13.5 g/dL (ref 12.0–15.0)
MCH: 29.1 pg (ref 26.0–34.0)
MCHC: 33.1 g/dL (ref 30.0–36.0)
MCV: 87.9 fL (ref 80.0–100.0)
Platelets: 448 10*3/uL — ABNORMAL HIGH (ref 150–400)
RBC: 4.64 MIL/uL (ref 3.87–5.11)
RDW: 12.6 % (ref 11.5–15.5)
WBC: 10.8 10*3/uL — ABNORMAL HIGH (ref 4.0–10.5)
nRBC: 0 % (ref 0.0–0.2)

## 2021-08-12 LAB — TROPONIN I (HIGH SENSITIVITY): Troponin I (High Sensitivity): 4 ng/L (ref <18)

## 2021-08-12 MED ORDER — IOHEXOL 350 MG/ML SOLN
50.0000 mL | Freq: Once | INTRAVENOUS | Status: AC | PRN
  Administered 2021-08-12: 50 mL via INTRAVENOUS
  Filled 2021-08-12: qty 50

## 2021-08-12 MED ORDER — OXYCODONE-ACETAMINOPHEN 5-325 MG PO TABS
1.0000 | ORAL_TABLET | Freq: Once | ORAL | Status: AC
  Administered 2021-08-12: 1 via ORAL
  Filled 2021-08-12: qty 1

## 2021-08-12 NOTE — ED Provider Notes (Signed)
? ?[facility] ?Provider Note ? ? ? Event Date/Time  ? First MD Initiated Contact with Patient 08/12/21 1230   ?  (approximate) ? ? ?History  ? ?Shortness of Breath ? ? ?HPI ? ?Diane Gomez is a 66 y.o. female with past medical history of GERD, alcohol use disorder, anxiety asthma, anemia, hypertension who presents with chest pain and shortness of breath.  Symptoms started several days ago.  Pain is located under the left breast and radiates to the left back.  Is fairly constant but is worse when she takes a deep breath.  She has history of chronic cough but nothing new.  Denies history of DVT/PE, history of cancer recent travel or immobilization recent surgery.  Denies fevers chills.  No nausea vomiting or abdominal pain.  No history of similar pain.  Is not worse with exertion. ?  ? ?Past Medical History:  ?Diagnosis Date  ? Alcohol abuse, uncomplicated 8/65/7846  ? Allergy   ? Alpha galactosidase deficiency   ? Anemia   ? Anxiety   ? Asthma   ? from cats and horses only  ? Cancer ([place]) 15 years ago  ? melanoma   ? Fibroid   ? SUBMUCOUS MYOMA  ? GERD (gastroesophageal reflux disease)   ? Heart murmur   ? History of nonmelanoma skin cancer 06/27/2014  ? Formatting of this note might be different from the original. Formatting of this note may be different from the original. Skin Cancer History- Non-Melanoma Skin Cancer  Diagnosis Location Biopsy Date Treatment date Procedure Surgeon  [facility] Left temple, scalp  2008 ED&C [place]  [facility] Left temple  2010    [facility] Left neck  2001    [place]  Right upper lip  2007 excision   [facility]  L upper chest  2007 ED&C   [facility]  ? Hypertension   ? Osteoporosis 06/2017  ? T score -2.6  ? Tumors   ? liver  ? ? ?Patient Active Problem List  ? Diagnosis Date Noted  ? Chronic cough 07/19/2021  ? Atherosclerosis of aorta ([place]) 07/18/2021  ? Alcohol use disorder, mild, in early remission, abuse 04/18/2021  ? High blood sugar 04/18/2021  ? Colitis, ischemic ([place]) 04/17/2021  ?  Cyst of kidney, acquired 02/21/2021  ? Irritable bowel syndrome with diarrhea 02/21/2021  ? Chronic kidney disease, stage 3a ([place]) 01/25/2021  ? Hypertension 10/30/2020  ? Hyposmolality and/or hyponatremia 10/30/2020  ? Mixed incontinence 04/04/2020  ? Allergy to alpha-gal 12/22/2018  ? Sinus bradycardia 10/14/2018  ? Anxiety 12/12/2016  ? Allergy-induced asthma 12/12/2016  ? Uterine leiomyoma 06/27/2014  ? Osteoporosis 06/27/2014  ? Gastroesophageal reflux disease 06/27/2014  ? Cardiac murmur, unspecified 06/27/2014  ? ? ? ?Physical Exam  ?Triage Vital Signs: ?ED Triage Vitals  ?Enc Vitals Group  ?   BP 08/12/21 1200 (!) 199/88  ?   Pulse Rate 08/12/21 1200 81  ?   Resp 08/12/21 1200 20  ?   Temp 08/12/21 1202 98.5 ?F (36.9 ?C)  ?   Temp Source 08/12/21 1202 Oral  ?   SpO2 08/12/21 1200 98 %  ?   Weight 08/12/21 1203 111 lb (50.3 kg)  ?   Height 08/12/21 1203 '5\' 1"'$  (1.549 m)  ?   Head Circumference --   ?   Peak Flow --   ?   Pain Score 08/12/21 1202 10  ?   Pain Loc --   ?   Pain Edu? --   ?  Excl. in [place]? --   ? ? ?Most recent vital signs: ?Vitals:  ? 08/12/21 1230 08/12/21 1236  ?BP:  (!) 166/84  ?Pulse: 72   ?Resp: 18   ?Temp:    ?SpO2: 96%   ? ? ? ?General: Awake, no distress.  ?CV:  Good peripheral perfusion.  ?Resp:  Normal effort.  ?Abd:  No distention.  Abdomen is nontender throughout ?Neuro:             Awake, Alert, Oriented x 3  ?Other:   ? ? ?ED Results / Procedures / Treatments  ?Labs ?(all labs ordered are listed, but only abnormal results are displayed) ?Labs Reviewed  ?BASIC METABOLIC PANEL - Abnormal; Notable for the following components:  ?    Result Value  ? Sodium 134 (*)   ? Glucose, Bld 114 (*)   ? All other components within normal limits  ?CBC - Abnormal; Notable for the following components:  ? WBC 10.8 (*)   ? Platelets 448 (*)   ? All other components within normal limits  ?TROPONIN I (HIGH SENSITIVITY)  ? ? ? ?EKG ? ?EKG interpreted by myself, normal sinus rhythm, normal axis, normal  intervals, inverted T wave in lead III, sagging ST segments in the inferior leads, no obvious acute ischemic change ? ? ?RADIOLOGY ?I reviewed the CTA of the chest which is negative for pulmonary embolism ? ? ?PROCEDURES: ? ?Critical Care performed: No ? ?Procedures ? ?The patient is on the cardiac monitor to evaluate for evidence of arrhythmia and/or significant heart rate changes. ? ? ?MEDICATIONS ORDERED IN ED: ?Medications  ?oxyCODONE-acetaminophen (PERCOCET/ROXICET) 5-325 MG per tablet 1 tablet (1 tablet Oral Given 08/12/21 1254)  ?iohexol (OMNIPAQUE) 350 MG/ML injection 50 mL (50 mLs Intravenous Contrast Given 08/12/21 1304)  ? ? ? ?IMPRESSION / MDM / ASSESSMENT AND PLAN / ED COURSE  ?I reviewed the triage vital signs and the nursing notes. ?             ?               ? ?Differential diagnosis includes, but is not limited to, pleurisy, musculoskeletal, pulmonary embolism, aortic dissection, ACS ? ?Patient is a 66 year old female presents with pleuritic left sharp chest pain that has been going on for the last several days.  Vital signs notable for hypertension otherwise she is not hypoxic or tachycardic.  Does appear somewhat uncomfortable with deep breath.  No signs of asymmetric leg swelling to suggest DVT abdomen is soft and nontender.  I reviewed her EKG which does have some nonspecific findings but nothing obvious for acute ischemia.  Her troponin is negative at 4 and with symptoms going on for several days I am reassured that it is unlikely to be ACS.  Pain also is quite atypical for acute coronary syndrome.  I am more concerned for PE given the pleuritic nature.  Also considering aortic dissection given radiation to the back although at this time course is to be somewhat unusual.  We will obtain a CT PE study of the chest given my primary concern is pulmonary embolism. ? ?CT PE is negative for pulmonary embolism.  There is a pleural nodule which will require follow-up, patient was informed of this  result.  With her reassuring work-up and stable vital signs I think she is appropriate for outpatient PCP follow-up ? ?  ? ? ?FINAL CLINICAL IMPRESSION(S) / ED DIAGNOSES  ? ?Final diagnoses:  ?Pleuritic chest pain  ?Pulmonary nodule  ? ? ? ?  Rx / DC Orders  ? ?ED Discharge Orders   ? ? None  ? ?  ? ? ? ?Note:  This document was prepared using Dragon voice recognition software and may include unintentional dictation errors. ?  ?Rada Hay, MD ?08/12/21 1349 ? ?

## 2021-08-12 NOTE — Discharge Instructions (Addendum)
Your CAT scan did not show any blood clots in your lungs and your blood work was reassuring this is unlikely to be a heart attack however if your pain is worsening or changing in someway that concerns you please return to the emergency department.  Otherwise please follow-up with your primary care provider. ?

## 2021-08-12 NOTE — ED Triage Notes (Signed)
Pt states she has pain in left rib cage under left arm. Pt states it felt like trapped gas but has not been relieved by burping. Pt states it hurts on her left side to take a deep breath. Pt with hx of htn. Pt states she took her meds this am. Pt denies falling or other injury.  ?

## 2021-08-12 NOTE — ED Notes (Signed)
Patient transported to CT 

## 2021-08-12 NOTE — ED Notes (Signed)
Pt to ED for L chest pain that goes through to L upper back. Pain started about 4-5 days ago. Hurts to breathe deeply. Pain is sharp, 10/10, worse with arm movement or position changes. Pt thought pain may be gas under rib area, and tried to drink carbonated beverage with no relief. ? ?Pt states has CKD stage 3A, prediabetic, and hx colitis and diverticulitis.  ? ?States sees cardiologist and gastroenterologist. States takes "heart medicine" since 40 years. ? ?Denies cigarette smoking or other risk factors for clots.  ?

## 2021-08-12 NOTE — ED Notes (Signed)
EDP at bedside  

## 2021-08-21 ENCOUNTER — Other Ambulatory Visit: Payer: Self-pay

## 2021-08-21 ENCOUNTER — Ambulatory Visit (INDEPENDENT_AMBULATORY_CARE_PROVIDER_SITE_OTHER): Payer: Medicare Other | Admitting: Family

## 2021-08-21 ENCOUNTER — Encounter: Payer: Self-pay | Admitting: Family

## 2021-08-21 VITALS — BP 142/78 | HR 56 | Temp 96.5°F | Resp 18 | Ht 61.0 in | Wt 114.4 lb

## 2021-08-21 DIAGNOSIS — R222 Localized swelling, mass and lump, trunk: Secondary | ICD-10-CM

## 2021-08-21 DIAGNOSIS — D72829 Elevated white blood cell count, unspecified: Secondary | ICD-10-CM | POA: Diagnosis not present

## 2021-08-21 DIAGNOSIS — R091 Pleurisy: Secondary | ICD-10-CM | POA: Diagnosis not present

## 2021-08-21 DIAGNOSIS — R350 Frequency of micturition: Secondary | ICD-10-CM | POA: Diagnosis not present

## 2021-08-21 DIAGNOSIS — S81801A Unspecified open wound, right lower leg, initial encounter: Secondary | ICD-10-CM | POA: Diagnosis not present

## 2021-08-21 NOTE — Assessment & Plan Note (Addendum)
Reviewed CT from hospital visit.  ?Recommend repeat CT in six months. Pt to f/u in five months and we will order. ?Pt currently asymptomatic ? ?Total time spent in office going over hospital records, going over acute and chronic concerns total 38 minutes. ?

## 2021-08-21 NOTE — Assessment & Plan Note (Signed)
Elevated wbc , mild, at hospital ?Will order urine just to be sure no infection ?Ordered cbc to repeat in two weeks lab only  ?

## 2021-08-21 NOTE — Progress Notes (Signed)
? ?Established Patient Office Visit ? ?Subjective:  ?Patient ID: Diane Gomez, female    DOB: [birth year]  Age: 66 y.o. MRN: [id number] ? ?CC:  ?Chief Complaint  ?Patient presents with  ? Hospitalization Follow-up  ? ? ?HPI ?Diane Gomez is here for hospital follow up.  ?Went to [facility] 08/12/21 presenting with chest pain and sob. ? ?H[facility]: [facility] ?Admit: 08/12/21 ?Discharged 3/19 as well, was not admitted. ?Discharge WU:[id number] pleurisy ?Discharge medications: was given percocet in hospital, and after that pain dissipated. ? ?EKG nsr inverted t wave in lead III no acute ischemic change ?CT PE negative for pulmonary embolism, was found pleural nodule, troponin negative at 4 ? IMPRESSION: ?1. No CT evidence for acute pulmonary embolus. ?2. 7 mm subpleural nodule superior segment left lower lobe. ?Non-contrast chest CT at 6-12 months is recommended. If the nodule ?is stable at time of repeat CT, then future CT at 18-24 months (from ?today's scan) is considered optional for low-risk patients, but is ?recommended for high-risk patients. This recommendation follows the ?consensus statement: Guidelines for Management of Incidental ?Pulmonary Nodules Detected on CT Images: From the Fleischner Society ?2017; Radiology 2017; [id number]. ?3. 14 mm left adrenal nodule has low attenuation most suggestive of ?adenoma. ?4. 5 mm nonobstructing stone upper pole left kidney. ?5. Aortic Atherosclerosis (ICD10-I70.0). ? ?Since has decreased smoking pot as often to reduce her symptoms which has helped some.  ? No longer with sob or chest pain.  ? ?Wbcs were mildly elevated at 10.8 at visit. Plts also were mildly elevated. ? ? ?Past Medical History:  ?Diagnosis Date  ? Alcohol abuse, uncomplicated 8/65/7846  ? Allergy   ? Alpha galactosidase deficiency   ? Anemia   ? Anxiety   ? Asthma   ? from cats and horses only  ? Cancer ([place]) 15 years ago  ? melanoma   ? Fibroid   ? SUBMUCOUS MYOMA  ? GERD  (gastroesophageal reflux disease)   ? Heart murmur   ? History of nonmelanoma skin cancer 06/27/2014  ? Formatting of this note might be different from the original. Formatting of this note may be different from the original. Skin Cancer History- Non-Melanoma Skin Cancer  Diagnosis Location Biopsy Date Treatment date Procedure Surgeon  [facility] Left temple, scalp  2008 ED&C [place]  [facility] Left temple  2010    [facility] Left neck  2001    [place]  Right upper lip  2007 excision   [facility]  L upper chest  2007 ED&C   [facility]  ? Hypertension   ? Osteoporosis 06/2017  ? T score -2.6  ? Tumors   ? liver  ? ? ?Past Surgical History:  ?Procedure Laterality Date  ? CESAREAN SECTION    ? ENDOMETRIAL ABLATION  2009  ? HER OPTION  ? HYSTEROSCOPY  2008  ? MYOMECTOMY  ? TUBAL LIGATION    ? ? ?Family History  ?Problem Relation Age of Onset  ? Hypertension Mother   ? Parkinsonism Sister   ?     PARKINSON'S DISEASE  ? Breast cancer Other   ?     Niece  Age 53  ? Aneurysm Father   ? Seizures Sister   ? Heart disease Brother   ? Colon cancer Neg Hx   ? Stomach cancer Neg Hx   ? Esophageal cancer Neg Hx   ? ? ?Social History  ? ?Socioeconomic History  ? Marital status: Divorced  ?  Spouse name: Not on  file  ? Number of children: Not on file  ? Years of education: Not on file  ? Highest education level: Not on file  ?Occupational History  ? Not on file  ?Tobacco Use  ? Smoking status: Former  ?  Types: Cigarettes  ? Smokeless tobacco: Never  ?Vaping Use  ? Vaping Use: Never used  ?Substance and Sexual Activity  ? Alcohol use: Yes  ?  Alcohol/week: 14.0 standard drinks  ?  Types: 7 Shots of liquor, 7 Standard drinks or equivalent per week  ?  Comment: moderate..."at least one drink per day" Vodka per pt  ? Drug use: Yes  ?  Frequency: 2.0 times per week  ?  Types: Marijuana  ?  Comment: occasional   ? Sexual activity: Not Currently  ?  Birth control/protection: Post-menopausal, Surgical  ?Other Topics Concern  ? Not on file  ?Social History Narrative  ? Not on  file  ? ?Social Determinants of Health  ? ?Financial Resource Strain: Not on file  ?Food Insecurity: Not on file  ?Transportation Needs: Not on file  ?Physical Activity: Not on file  ?Stress: Not on file  ?Social Connections: Not on file  ?Intimate Partner Violence: Not on file  ? ? ?Outpatient Medications Prior to Visit  ?Medication Sig Dispense Refill  ? albuterol (VENTOLIN HFA) 108 (90 Base) MCG/ACT inhaler Inhale 2 puffs into the lungs every 6 (six) hours as needed for wheezing or shortness of breath. 18 g 0  ? ALPRAZolam (XANAX) 0.5 MG tablet Take 0.5 mg by mouth at bedtime as needed for anxiety.    ? diltiazem (TIAZAC) 240 MG 24 hr capsule Take 240 mg by mouth daily.    ? esomeprazole (NEXIUM) 20 MG capsule Take 20 mg by mouth daily.    ? metoprolol succinate (TOPROL-XL) 50 MG 24 hr tablet Take 50 mg by mouth 2 (two) times daily. Take with or immediately following a meal.    ? NON FORMULARY Balance of Nature-Fruits and Veggies 3 tablets daily    ? Probiotic Product (PROBIOTIC-10 PO) Take 1 tablet by mouth daily.    ? ?No facility-administered medications prior to visit.  ? ? ?Allergies  ?Allergen Reactions  ? Ondansetron   ?  Bilateral eye swelling without breathing changes ?  ? Penicillins Hives  ? Prunus Persica Hives  ?  PEACHES  ? Brimonidine   ?  Bit by a tick ?  ? Meat [Alpha-Gal]   ? Other   ?  Cats and horses  ? Shellfish Allergy   ? ? ?ROS ?Review of Systems ? ?Review of Systems  ?Respiratory:  Negative for shortness of breath.   ?Cardiovascular:  Negative for chest pain and palpitations.  ?Gastrointestinal:  Negative for constipation and diarrhea.  ?Genitourinary:  Negative for dysuria, frequency and urgency.  ?Psychiatric/Behavioral:  Negative for depression and suicidal ideas.   ?All other systems reviewed and are negative. ? ?  ?Objective:  ?  ?Physical Exam ? ?Gen: NAD, resting comfortably ?CV: RRR with no murmurs appreciated ?Pulm: NWOB, CTAB with no crackles, wheezes, or rhonchi ?Skin: warm,  dry ?Psych: Normal affect and thought content ? ?BP (!) 142/78   Pulse (!) 56   Temp (!) 96.5 ?F (35.8 ?C)   Resp 18   Ht '5\' 1"'$  (1.549 m)   Wt 114 lb 7 oz (51.9 kg)   SpO2 99%   BMI 21.62 kg/m?  ?Wt Readings from Last 3 Encounters:  ?08/21/21 114 lb 7 oz (51.9  kg)  ?08/12/21 111 lb (50.3 kg)  ?07/18/21 116 lb (52.6 kg)  ? ? ? ?Health Maintenance Due  ?Topic Date Due  ? Hepatitis C Screening  Never done  ? Zoster Vaccines- Shingrix (1 of 2) Never done  ? COVID-19 Vaccine (3 - Pfizer risk series) 06/07/2020  ? ? ?There are no preventive care reminders to display for this patient. ? ?Lab Results  ?Component Value Date  ? TSH 0.622 07/08/2013  ? ?Lab Results  ?Component Value Date  ? WBC 10.8 (H) 08/12/2021  ? HGB 13.5 08/12/2021  ? HCT 40.8 08/12/2021  ? MCV 87.9 08/12/2021  ? PLT 448 (H) 08/12/2021  ? ?Lab Results  ?Component Value Date  ? NA 134 (L) 08/12/2021  ? K 3.9 08/12/2021  ? CO2 23 08/12/2021  ? GLUCOSE 114 (H) 08/12/2021  ? BUN 11 08/12/2021  ? CREATININE 0.80 08/12/2021  ? BILITOT 0.4 10/16/2020  ? ALKPHOS 71 10/16/2020  ? AST 19 10/16/2020  ? ALT 11 10/16/2020  ? PROT 8.0 10/16/2020  ? ALBUMIN 4.7 10/16/2020  ? CALCIUM 9.7 08/12/2021  ? ANIONGAP 11 08/12/2021  ? GFR 55.87 (L) 10/16/2020  ? ?Lab Results  ?Component Value Date  ? CHOL 256 (H) 12/03/2018  ? ?Lab Results  ?Component Value Date  ? HDL 94.00 12/03/2018  ? ?Lab Results  ?Component Value Date  ? LDLCALC 127 (H) 02/18/2017  ? ?Lab Results  ?Component Value Date  ? TRIG 376.0 (H) 12/03/2018  ? ?Lab Results  ?Component Value Date  ? CHOLHDL 3 12/03/2018  ? ?Lab Results  ?Component Value Date  ? HGBA1C 5.6 04/17/2021  ? ? ?  ?Assessment & Plan:  ? ?Problem List Items Addressed This Visit   ? ?  ? Respiratory  ? Pleurisy  ?  Resolved.  ?Pt to return if symptoms are to return. ?  ?  ?  ? Other  ? Leukocytosis - Primary  ?  Elevated wbc , mild, at hospital ?Will order urine just to be sure no infection ?Ordered cbc to repeat in two weeks lab only   ?  ?  ? Relevant Orders  ? CBC with Differential  ? Urine Culture  ? Pleural nodule  ?  Reviewed CT from hospital visit.  ?Recommend repeat CT in six months. Pt to f/u in five months and we will order. ?Pt currently asymp

## 2021-08-21 NOTE — Assessment & Plan Note (Signed)
Resolved.  ?Pt to return if symptoms are to return. ?

## 2021-08-21 NOTE — Patient Instructions (Signed)
?  I have created an order for lab work today during our visit.  ?Please schedule an appointment on your way out to return to the lab at your convenience.. I will reach out to you in regards to the labs when I receive the results.  ? ?It was a pleasure seeing you today! Please do not hesitate to reach out with any questions and or concerns. ? ?Regards,  ? ?Corayma Cashatt ?FNP-C ? ? ?

## 2021-08-22 LAB — URINE CULTURE
MICRO NUMBER:: 13189913
Result:: NO GROWTH
SPECIMEN QUALITY:: ADEQUATE

## 2021-08-28 DIAGNOSIS — H2513 Age-related nuclear cataract, bilateral: Secondary | ICD-10-CM | POA: Diagnosis not present

## 2021-09-04 ENCOUNTER — Other Ambulatory Visit (INDEPENDENT_AMBULATORY_CARE_PROVIDER_SITE_OTHER): Payer: Medicare Other

## 2021-09-04 DIAGNOSIS — D72829 Elevated white blood cell count, unspecified: Secondary | ICD-10-CM | POA: Diagnosis not present

## 2021-09-04 LAB — CBC WITH DIFFERENTIAL/PLATELET
Basophils Absolute: 0 10*3/uL (ref 0.0–0.1)
Basophils Relative: 0.4 % (ref 0.0–3.0)
Eosinophils Absolute: 0.1 10*3/uL (ref 0.0–0.7)
Eosinophils Relative: 2.1 % (ref 0.0–5.0)
HCT: 38.4 % (ref 36.0–46.0)
Hemoglobin: 12.7 g/dL (ref 12.0–15.0)
Lymphocytes Relative: 33.1 % (ref 12.0–46.0)
Lymphs Abs: 2.1 10*3/uL (ref 0.7–4.0)
MCHC: 33 g/dL (ref 30.0–36.0)
MCV: 88.7 fl (ref 78.0–100.0)
Monocytes Absolute: 0.7 10*3/uL (ref 0.1–1.0)
Monocytes Relative: 10.6 % (ref 3.0–12.0)
Neutro Abs: 3.5 10*3/uL (ref 1.4–7.7)
Neutrophils Relative %: 53.8 % (ref 43.0–77.0)
Platelets: 365 10*3/uL (ref 150.0–400.0)
RBC: 4.33 Mil/uL (ref 3.87–5.11)
RDW: 13.6 % (ref 11.5–15.5)
WBC: 6.5 10*3/uL (ref 4.0–10.5)

## 2021-09-27 DIAGNOSIS — K580 Irritable bowel syndrome with diarrhea: Secondary | ICD-10-CM | POA: Diagnosis not present

## 2021-09-27 DIAGNOSIS — N1831 Chronic kidney disease, stage 3a: Secondary | ICD-10-CM | POA: Diagnosis not present

## 2021-09-27 DIAGNOSIS — N281 Cyst of kidney, acquired: Secondary | ICD-10-CM | POA: Diagnosis not present

## 2021-10-03 DIAGNOSIS — K580 Irritable bowel syndrome with diarrhea: Secondary | ICD-10-CM | POA: Diagnosis not present

## 2021-10-03 DIAGNOSIS — I10 Essential (primary) hypertension: Secondary | ICD-10-CM | POA: Diagnosis not present

## 2021-10-03 DIAGNOSIS — N200 Calculus of kidney: Secondary | ICD-10-CM | POA: Diagnosis not present

## 2021-10-03 DIAGNOSIS — N281 Cyst of kidney, acquired: Secondary | ICD-10-CM | POA: Diagnosis not present

## 2021-10-03 DIAGNOSIS — E278 Other specified disorders of adrenal gland: Secondary | ICD-10-CM | POA: Diagnosis not present

## 2021-10-03 DIAGNOSIS — E871 Hypo-osmolality and hyponatremia: Secondary | ICD-10-CM | POA: Diagnosis not present

## 2021-10-03 DIAGNOSIS — N1831 Chronic kidney disease, stage 3a: Secondary | ICD-10-CM | POA: Diagnosis not present

## 2021-12-24 DIAGNOSIS — D2262 Melanocytic nevi of left upper limb, including shoulder: Secondary | ICD-10-CM | POA: Diagnosis not present

## 2021-12-24 DIAGNOSIS — D225 Melanocytic nevi of trunk: Secondary | ICD-10-CM | POA: Diagnosis not present

## 2021-12-24 DIAGNOSIS — X32XXXA Exposure to sunlight, initial encounter: Secondary | ICD-10-CM | POA: Diagnosis not present

## 2021-12-24 DIAGNOSIS — L570 Actinic keratosis: Secondary | ICD-10-CM | POA: Diagnosis not present

## 2021-12-24 DIAGNOSIS — D485 Neoplasm of uncertain behavior of skin: Secondary | ICD-10-CM | POA: Diagnosis not present

## 2021-12-24 DIAGNOSIS — L821 Other seborrheic keratosis: Secondary | ICD-10-CM | POA: Diagnosis not present

## 2021-12-24 DIAGNOSIS — D0359 Melanoma in situ of other part of trunk: Secondary | ICD-10-CM | POA: Diagnosis not present

## 2021-12-24 DIAGNOSIS — D2261 Melanocytic nevi of right upper limb, including shoulder: Secondary | ICD-10-CM | POA: Diagnosis not present

## 2022-01-11 DIAGNOSIS — D0359 Melanoma in situ of other part of trunk: Secondary | ICD-10-CM | POA: Diagnosis not present

## 2022-01-11 DIAGNOSIS — D045 Carcinoma in situ of skin of trunk: Secondary | ICD-10-CM | POA: Diagnosis not present

## 2022-01-17 IMAGING — CT CT HEAD W/O CM
3 series · 16 of 47 positions shown, 19 images · non-contrast
Comparison: None.

CLINICAL DATA: Headache, intracranial hemorrhage suspected

Elevated blood pressure, systolic blood pressure 200. Dizziness.
EXAM:
CT HEAD WITHOUT CONTRAST
TECHNIQUE: Contiguous axial images were obtained from the base of the skull
through the vertex without intravenous contrast.

[Series 3: head wo · axial · 0.41mm/px · z∈[-114,+11]mm · 10 of 30 slices shown, 13 images]
[im 3/30  brain]
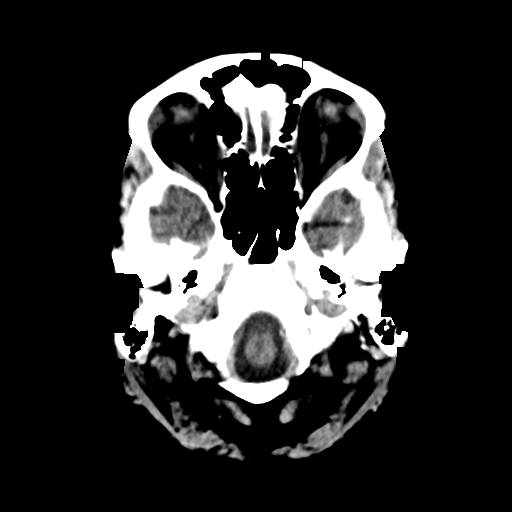
[im 3/30  bone]
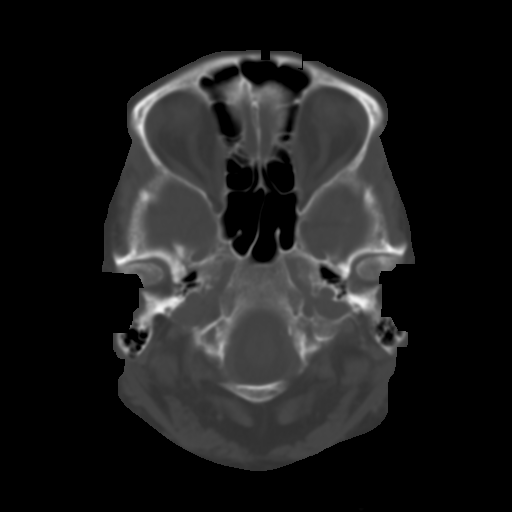
[im 6/30  brain]
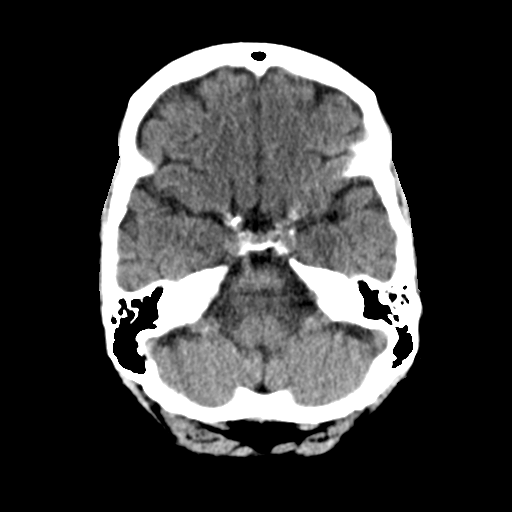
[im 9/30  brain]
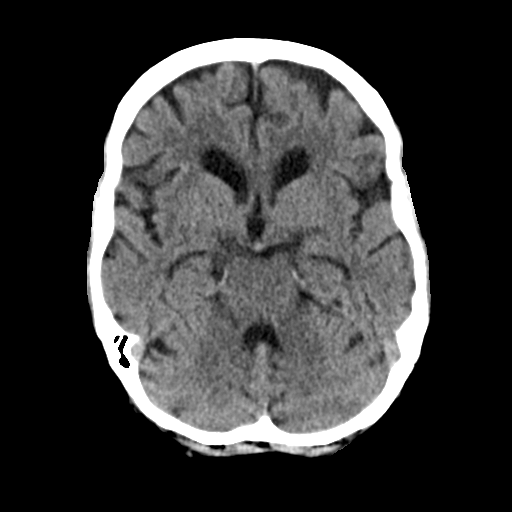
[im 11/30  brain]
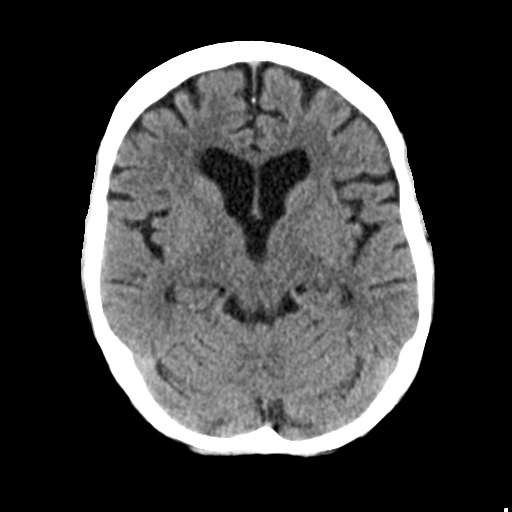
[im 14/30  brain]
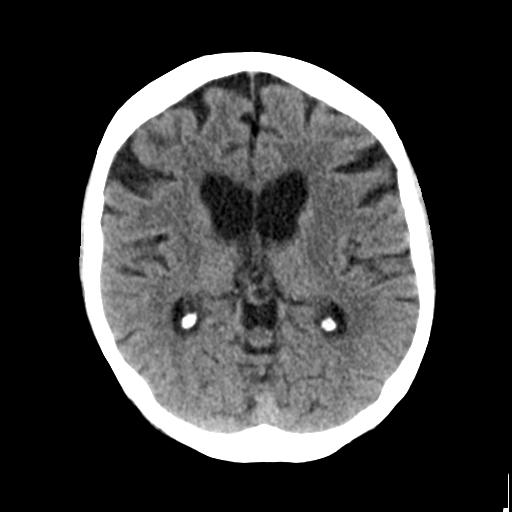
[im 14/30  bone]
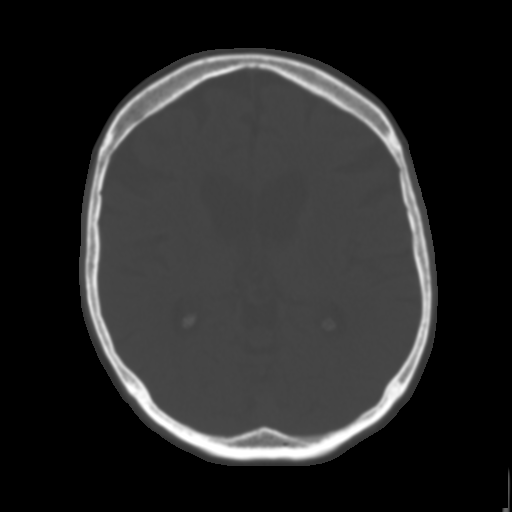
[im 17/30  brain]
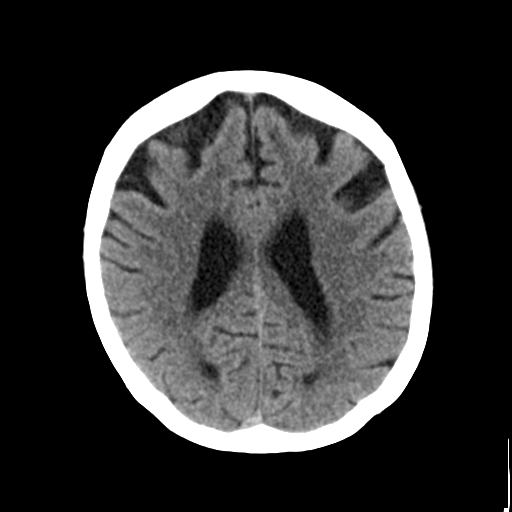
[im 20/30  brain]
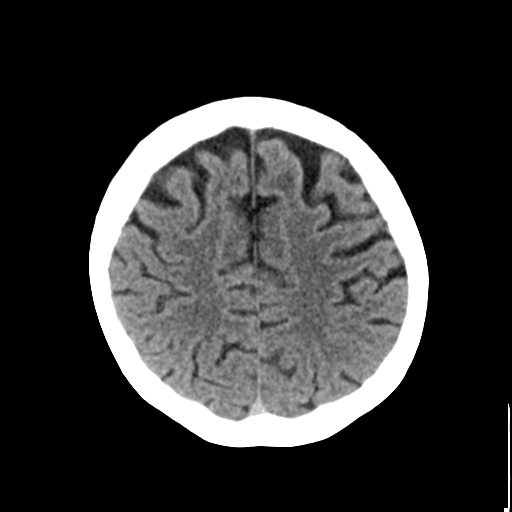
[im 23/30  brain]
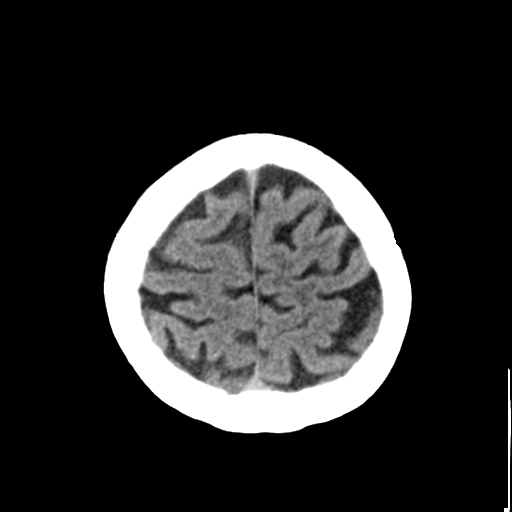
[im 25/30  brain]
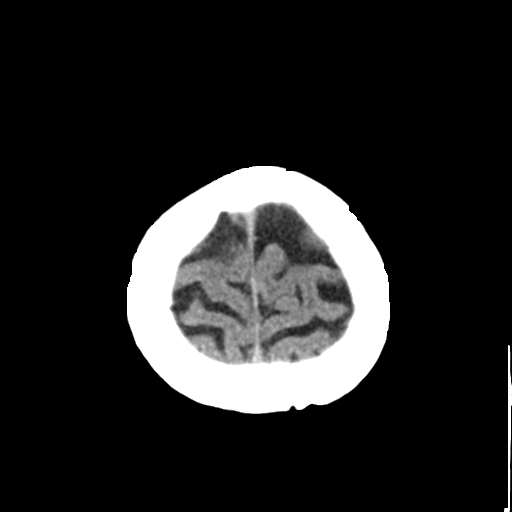
[im 25/30  bone]
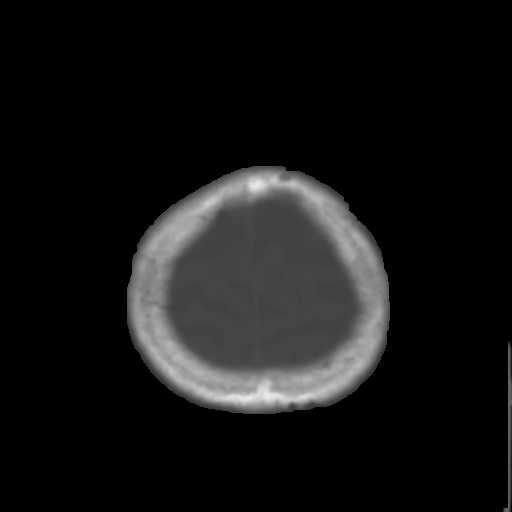
[im 28/30  brain]
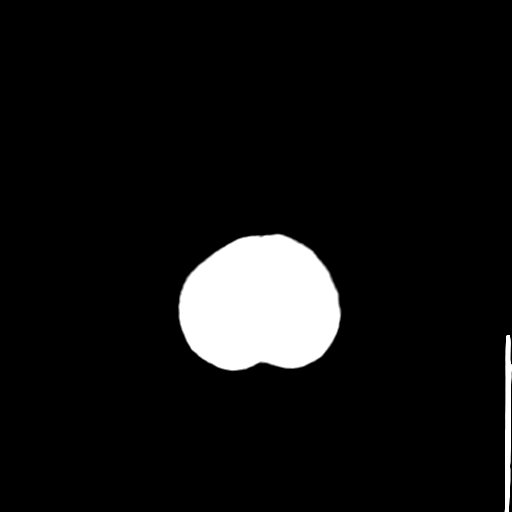

[Series 4: coronal soft tissue · coronal · 0.28mm/px · 3 of 60 slices shown]
[im 20/60  brain]
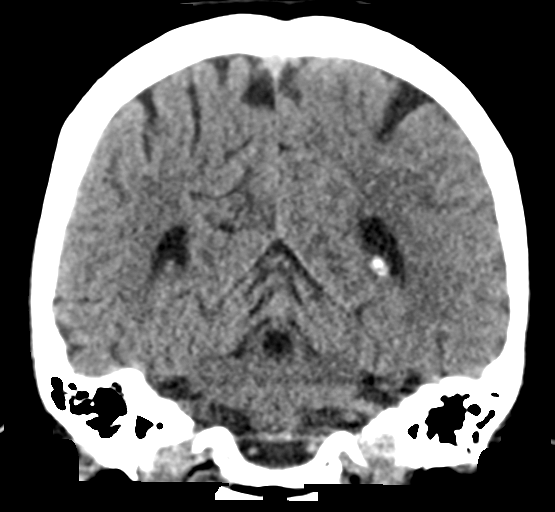
[im 27/60  brain]
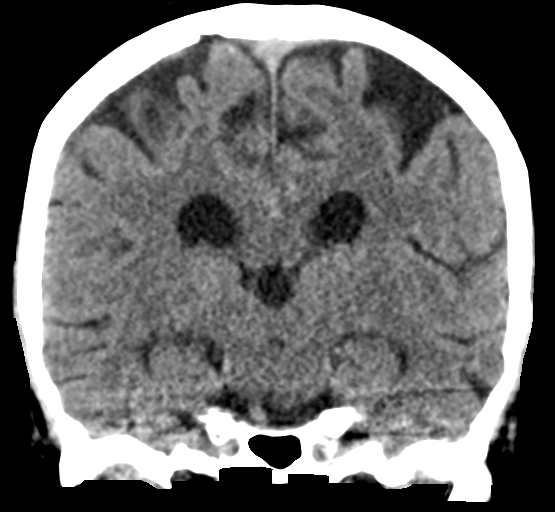
[im 33/60  brain]
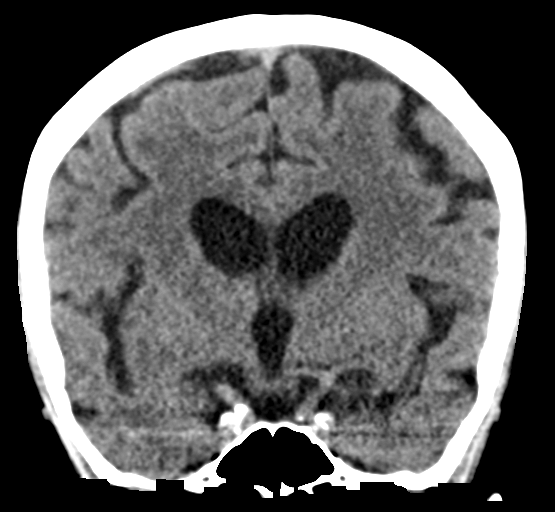

[Series 5: sagittal soft tissue · sagittal · 0.28mm/px · 3 of 52 slices shown]
[im 18/52  brain]
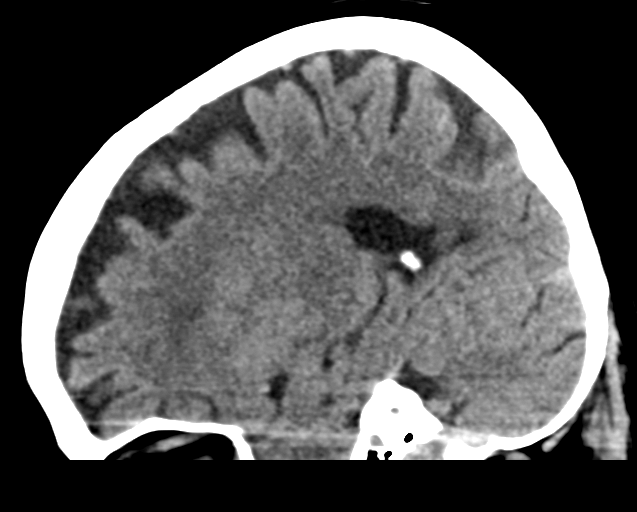
[im 26/52  brain]
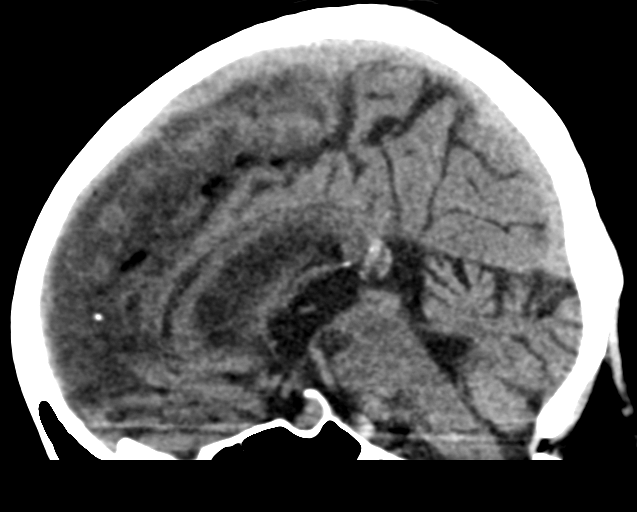
[im 35/52  brain]
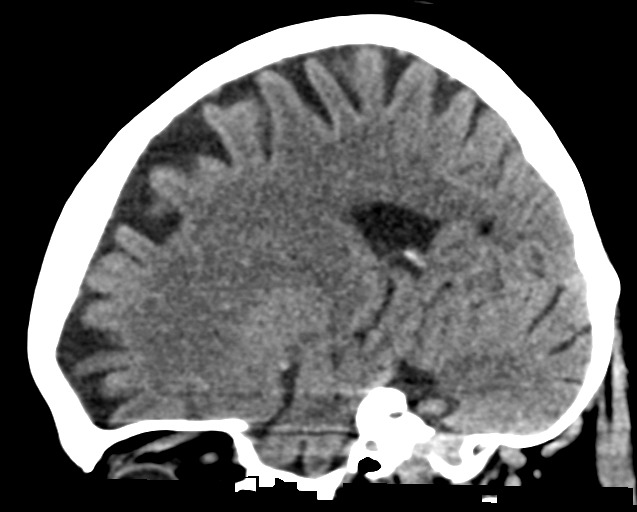

[16 of 47 positions shown; findings below may reference images not displayed]

FINDINGS: Brain: Brain volume is normal for age. Mild periventricular chronic
small vessel ischemia. No intracranial hemorrhage, mass effect, or
midline shift. No hydrocephalus. The basilar cisterns are patent. No
evidence of territorial infarct or acute ischemia. No extra-axial or
intracranial fluid collection.

Vascular: No hyperdense vessel.

Skull: No fracture or focal lesion.

Sinuses/Orbits: Paranasal sinuses and mastoid air cells are clear.
The visualized orbits are unremarkable.

Other: None.
IMPRESSION: 1. No acute intracranial abnormality.
2. Mild chronic small vessel ischemia.

## 2022-04-03 DIAGNOSIS — I10 Essential (primary) hypertension: Secondary | ICD-10-CM | POA: Diagnosis not present

## 2022-04-03 DIAGNOSIS — N1831 Chronic kidney disease, stage 3a: Secondary | ICD-10-CM | POA: Diagnosis not present

## 2022-04-03 DIAGNOSIS — Z23 Encounter for immunization: Secondary | ICD-10-CM | POA: Diagnosis not present

## 2022-04-08 ENCOUNTER — Telehealth: Payer: Self-pay | Admitting: Family

## 2022-04-08 NOTE — Telephone Encounter (Signed)
LVM for pt to rtn my call to schedule AWV-I with [facility] call back # [phone] 

## 2022-04-09 ENCOUNTER — Ambulatory Visit: Payer: Medicare Other

## 2022-04-09 DIAGNOSIS — N1831 Chronic kidney disease, stage 3a: Secondary | ICD-10-CM | POA: Diagnosis not present

## 2022-04-09 DIAGNOSIS — E871 Hypo-osmolality and hyponatremia: Secondary | ICD-10-CM | POA: Diagnosis not present

## 2022-04-09 DIAGNOSIS — N281 Cyst of kidney, acquired: Secondary | ICD-10-CM | POA: Diagnosis not present

## 2022-04-09 DIAGNOSIS — I10 Essential (primary) hypertension: Secondary | ICD-10-CM | POA: Diagnosis not present

## 2022-04-17 DIAGNOSIS — K580 Irritable bowel syndrome with diarrhea: Secondary | ICD-10-CM | POA: Diagnosis not present

## 2022-04-17 DIAGNOSIS — E871 Hypo-osmolality and hyponatremia: Secondary | ICD-10-CM | POA: Diagnosis not present

## 2022-04-17 DIAGNOSIS — N200 Calculus of kidney: Secondary | ICD-10-CM | POA: Diagnosis not present

## 2022-04-17 DIAGNOSIS — E278 Other specified disorders of adrenal gland: Secondary | ICD-10-CM | POA: Diagnosis not present

## 2022-04-17 DIAGNOSIS — N1831 Chronic kidney disease, stage 3a: Secondary | ICD-10-CM | POA: Diagnosis not present

## 2022-04-17 DIAGNOSIS — I10 Essential (primary) hypertension: Secondary | ICD-10-CM | POA: Diagnosis not present

## 2022-04-17 DIAGNOSIS — N281 Cyst of kidney, acquired: Secondary | ICD-10-CM | POA: Diagnosis not present

## 2022-05-14 ENCOUNTER — Ambulatory Visit (INDEPENDENT_AMBULATORY_CARE_PROVIDER_SITE_OTHER): Payer: Medicare Other

## 2022-05-14 VITALS — Ht 61.0 in | Wt 114.0 lb

## 2022-05-14 DIAGNOSIS — Z0000 Encounter for general adult medical examination without abnormal findings: Secondary | ICD-10-CM | POA: Diagnosis not present

## 2022-05-14 NOTE — Progress Notes (Signed)
Subjective:   Diane Gomez is a 66 y.o. female who presents for an Initial Medicare Annual Wellness Visit.  Review of Systems    No ROS.  Medicare Wellness Virtual Visit.  Visual/audio telehealth visit, UTA vital signs.   See social history for additional risk factors.   Cardiac Risk Factors include: advanced age (>58mn, >>17women)     Objective:    Today's Vitals   05/14/22 1121  Weight: 114 lb (51.7 kg)  Height: '5\' 1"'$  (1.549 m)   Body mass index is 21.54 kg/m.     05/14/2022   11:50 AM 08/12/2021   12:06 PM 05/07/2020    7:12 PM 01/07/2018    9:53 AM 04/03/2017    2:52 PM 11/29/2016    1:05 PM  Advanced Directives  Does Patient Have a Medical Advance Directive? No No No No No No  Would patient like information on creating a medical advance directive? No - Patient declined No - Patient declined No - Patient declined       Current Medications (verified) Outpatient Encounter Medications as of 05/14/2022  Medication Sig   albuterol (VENTOLIN HFA) 108 (90 Base) MCG/ACT inhaler Inhale 2 puffs into the lungs every 6 (six) hours as needed for wheezing or shortness of breath.   ALPRAZolam (XANAX) 0.5 MG tablet Take 0.5 mg by mouth at bedtime as needed for anxiety.   diltiazem (TIAZAC) 240 MG 24 hr capsule Take 240 mg by mouth daily.   esomeprazole (NEXIUM) 20 MG capsule Take 20 mg by mouth daily.   metoprolol succinate (TOPROL-XL) 50 MG 24 hr tablet Take 50 mg by mouth 2 (two) times daily. Take with or immediately following a meal.   NON FORMULARY Balance of Nature-Fruits and Veggies 3 tablets daily   Probiotic Product (PROBIOTIC-10 PO) Take 1 tablet by mouth daily.   No facility-administered encounter medications on file as of 05/14/2022.    Allergies (verified) Ondansetron, Penicillins, Prunus persica, Brimonidine, Meat [alpha-gal], Other, and Shellfish allergy   History: Past Medical History:  Diagnosis Date   Alcohol abuse, uncomplicated 64/16/3845  Allergy     Alpha galactosidase deficiency    Anemia    Anxiety    Asthma    from cats and horses only   [place] ago   melanoma    Fibroid    SUBMUCOUS MYOMA   GERD (gastroesophageal reflux disease)    Heart murmur    History of nonmelanoma skin cancer 06/27/2014   Formatting of this note might be different from the original. Formatting of this note may be different from the original. Skin Cancer History- Non-Melanoma Skin Cancer  Diagnosis Location [facility]opsy Date [facility] date [facility] [place]  [facility]    [place]  [facility]   [facility]  L upper chest  2007 ED&C   [facility]   Hypertension    Osteoporosis 06/2017   T score -2.6   Tumors    liver   Past Surgical History:  Procedure Laterality Date   CESAREAN SECTION     ENDOMETRIAL ABLATION  2009   HER OPTION   HYSTEROSCOPY  2008   MYOMECTOMY   TUBAL LIGATION     Family History  Problem Relation Age of Onset   Hypertension Mother    Parkinsonism Sister        PARKINSON'S DISEASE  Breast cancer Other        Niece  Age 23   Aneurysm Father    Seizures Sister    Heart disease Brother    Colon cancer Neg Hx    Stomach cancer Neg Hx    Esophageal cancer Neg Hx    Social History   Socioeconomic History   Marital status: Divorced    Spouse name: Not on file   Number of children: Not on file   Years of education: Not on file   Highest education level: Not on file  Occupational History   Not on file  Tobacco Use   Smoking status: Former    Types: Cigarettes   Smokeless tobacco: Never  Vaping Use   Vaping Use: Never used  Substance and Sexual Activity   Alcohol use: Not Currently    Comment: OCC   Drug use: Yes    Frequency: 2.0 times per week    Types: Marijuana    Comment: occasional    Sexual activity: Not Currently    Birth control/protection: Post-menopausal, Surgical  Other Topics Concern   Not on file  Social  History Narrative   Not on file   Social Determinants of Health   Financial Resource Strain: Low Risk  (05/14/2022)   Overall Financial Resource Strain (CARDIA)    Difficulty of Paying Living Expenses: Not hard at all  Food Insecurity: No Food Insecurity (05/14/2022)   Hunger Vital Sign    Worried About Running Out of Food in the Last Year: Never true    Ran Out of Food in the Last Year: Never true  Transportation Needs: No Transportation Needs (05/14/2022)   PRAPARE - Hydrologist (Medical): No    Lack of Transportation (Non-Medical): No  Physical Activity: Not on file  Stress: No Stress Concern Present (05/14/2022)   [place]    Feeling of Stress : Not at all  Social Connections: Unknown (05/14/2022)   Social Connection and Isolation Panel [NHANES]    Frequency of Communication with Friends and Family: More than three times a week    Frequency of Social Gatherings with Friends and Family: More than three times a week    Attends Religious Services: More than 4 times per year    Active Member of [employer] or Organizations: No    Attends Music therapist: Not on file    Marital Status: Not on file    Tobacco Counseling Counseling given: Not Answered   Clinical Intake:  Pre-visit preparation completed: Yes        Diabetes: No  How often do you need to have someone help you when you read instructions, pamphlets, or other written materials from your doctor or pharmacy?: 1 - Never    Interpreter Needed?: No    Activities of Daily Living    05/14/2022   11:22 AM  In your present state of health, do you have any difficulty performing the following activities:  Hearing? 0  Vision? 0  Difficulty concentrating or making decisions? 0  Walking or climbing stairs? 0  Dressing or bathing? 0  Doing errands, shopping? 0  Preparing Food and eating ? N  Using the  Toilet? N  In the past six months, have you accidently leaked urine? N  Comment Followed by Urology and PCP  Do you have problems with loss of bowel control? N  Comment Followed by Gastrenterology and PCP  Managing your  Medications? N  Managing your Finances? N  Housekeeping or managing your Housekeeping? N    Patient Care Team: Eugenia Pancoast, FNP as PCP - General (Family Medicine)  Indicate any recent Medical Services you may have received from other than [facility] providers in the past year (date may be approximate).     Assessment:   This is a routine wellness examination for [place].  I connected with  Diane Gomez on 05/14/22 by a audio enabled telemedicine application and verified that I am speaking with the correct person using two identifiers.  Patient Location: Home  Provider Location: Office/Clinic  I discussed the limitations of evaluation and management by telemedicine. The patient expressed understanding and agreed to proceed.   Hearing/Vision screen Hearing Screening - Comments:: Patient is able to hear conversational tones without difficulty.  No issues reported.  Vision Screening - Comments:: Followed by [facility] They have seen their ophthalmologist in the last 12 months.    Dietary issues and exercise activities discussed: Current Exercise Habits: Home exercise routine, Intensity: Mild Lean meat only Balance of Nature vitamin/fruit  Good water intake    Goals Addressed             This Visit's Progress    Increase physical activity       Volunteer services or connect with others at the [facility].  Walk more for exercise.       Depression Screen    05/14/2022   11:34 AM 04/17/2021    1:43 PM 12/06/2018    8:05 PM 05/04/2018   11:54 AM 02/18/2017    8:31 AM 12/12/2016    9:37 AM  PHQ 2/9 Scores  PHQ - 2 Score 1 2 0 0 0 0  PHQ- 9 Score  6        Fall Risk    05/14/2022   11:40 AM 04/17/2021    1:39 PM 02/18/2017    8:31  AM 12/12/2016    9:37 AM  [place] in the past year? 0 0 No No  Number falls in past yr: 0 0    Injury with Fall? 0 0    Follow up Falls evaluation completed;Falls prevention discussed       FALL RISK PREVENTION PERTAINING TO THE HOME: Home free of loose throw rugs in walkways, pet beds, electrical cords, etc? Yes  Adequate lighting in your home to reduce risk of falls? Yes   ASSISTIVE DEVICES UTILIZED TO PREVENT FALLS: Life alert? No  Use of a cane, walker or w/c? No   TIMED UP AND GO: Was the test performed? No .   Cognitive Function:        05/14/2022   11:49 AM  6CIT Screen  What Year? 0 points  What month? 0 points  What time? 0 points  Count back from 20 0 points  Months in reverse 0 points  Repeat phrase 0 points  Total Score 0 points    Immunizations Immunization History  Administered Date(s) Administered   Fluad Quad(high Dose 65+) 04/10/2022   Influenza Inj Mdck Quad Pf 03/25/2021   Influenza,inj,Quad PF,6+ Mos 03/27/2017, 04/03/2018   Influenza-Unspecified 03/18/2020   Moderna Covid-19 Vaccine Bivalent Booster 40yr & up 06/07/2020   PFIZER Comirnaty(Gray Top)Covid-19 Tri-Sucrose Vaccine 08/20/2019, 09/10/2019   Tdap 05/11/2014    TDAP status: Due, Education has been provided regarding the importance of this vaccine. Advised may receive this vaccine at local pharmacy or Health Dept. Aware to provide a  copy of the vaccination record if obtained from local pharmacy or Health Dept. Verbalized acceptance and understanding.   Pneumococcal vaccine status: Due, Education has been provided regarding the importance of this vaccine. Advised may receive this vaccine at local pharmacy or Health Dept. Aware to provide a copy of the vaccination record if obtained from local pharmacy or Health Dept. Verbalized acceptance and understanding.  Shingrix Completed?: No.    Education has been provided regarding the importance of this vaccine. Patient has been  advised to call insurance company to determine out of pocket expense if they have not yet received this vaccine. Advised may also receive vaccine at local pharmacy or Health Dept. Verbalized acceptance and understanding.  Screening Tests Health Maintenance  Topic Date Due   COVID-19 Vaccine (4 - 2023-24 season) 05/30/2022 (Originally 01/25/2022)   Zoster Vaccines- Shingrix (1 of 2) 08/13/2022 (Originally [birth year]/03/1972)   Pneumonia Vaccine 72+ Years old (1 - PCV) 05/15/2023 (Originally 12/04/2018)   MAMMOGRAM  05/15/2023 (Originally 12/14/2010)   Hepatitis C Screening  05/15/2023 (Originally 12/04/1971)   Medicare Annual Wellness (AWV)  05/15/2023   DTaP/Tdap/Td (2 - Td or Tdap) 05/11/2024   COLONOSCOPY (Pts 45-63yr Insurance coverage will need to be confirmed)  12/22/2030   INFLUENZA VACCINE  Completed   DEXA SCAN  Completed   HPV VACCINES  Aged Out    Health Maintenance There are no preventive care reminders to display for this patient.  Mammogram- deferred at this time per patient preference.  DG Chest 2 View: completed 08/12/21.    Hepatitis C Screening: deferred per patient preference.   Vision Screening: Recommended annual ophthalmology exams for early detection of glaucoma and other disorders of the eye.  Dental Screening: Recommended annual dental exams for proper oral hygiene  Community Resource Referral / Chronic Care Management: CRR required this visit?  No   CCM required this visit?  No      Plan:     I have personally reviewed and noted the following in the patient's chart:   Medical and social history Use of alcohol, tobacco or illicit drugs  Current medications and supplements including opioid prescriptions. Patient is not currently taking opioid prescriptions. Functional ability and status Nutritional status Physical activity Advanced directives List of other physicians Hospitalizations, surgeries, and ER visits in previous 12 months Vitals Screenings to  include cognitive, depression, and falls Referrals and appointments  In addition, I have reviewed and discussed with patient certain preventive protocols, quality metrics, and best practice recommendations. A written personalized care plan for preventive services as well as general preventive health recommendations were provided to patient.     DLeta Jungling LPN   [id number]

## 2022-05-14 NOTE — Patient Instructions (Addendum)
Diane Gomez , Thank you for taking time to come for your Medicare Wellness Visit. I appreciate your ongoing commitment to your health goals. Please review the following plan we discussed and let me know if I can assist you in the future.   These are the goals we discussed:  Goals      Increase physical activity     Volunteer services or connect with others at the [facility].  Walk more for exercise.        This is a list of the screening recommended for you and due dates:  Health Maintenance  Topic Date Due   COVID-19 Vaccine (4 - 2023-24 season) 05/30/2022*   Zoster (Shingles) Vaccine (1 of 2) 08/13/2022*   Pneumonia Vaccine (1 - PCV) 05/15/2023*   Mammogram  05/15/2023*   Hepatitis C Screening: USPSTF Recommendation to screen - Ages 18-79 yo.  05/15/2023*   Medicare Annual Wellness Visit  05/15/2023   DTaP/Tdap/Td vaccine (2 - Td or Tdap) 05/11/2024   Colon Cancer Screening  12/22/2030   Flu Shot  Completed   DEXA scan (bone density measurement)  Completed   HPV Vaccine  Aged Out  *Topic was postponed. The date shown is not the original due date.   Conditions/risks identified: none new.  Next appointment: Follow up in one year for your annual wellness visit    Preventive Care 65 Years and Older, Female Preventive care refers to lifestyle choices and visits with your health care provider that can promote health and wellness. What does preventive care include? A yearly physical exam. This is also called an annual well check. Dental exams once or twice a year. Routine eye exams. Ask your health care provider how often you should have your eyes checked. Personal lifestyle choices, including: Daily care of your teeth and gums. Regular physical activity. Eating a healthy diet. Avoiding tobacco and drug use. Limiting alcohol use. Practicing safe sex. Taking low-dose aspirin every day. Taking vitamin and mineral supplements as recommended by your health care  provider. What happens during an annual well check? The services and screenings done by your health care provider during your annual well check will depend on your age, overall health, lifestyle risk factors, and family history of disease. Counseling  Your health care provider may ask you questions about your: Alcohol use. Tobacco use. Drug use. Emotional well-being. Home and relationship well-being. Sexual activity. Eating habits. History of falls. Memory and ability to understand (cognition). Work and work Statistician. Reproductive health. Screening  You may have the following tests or measurements: Height, weight, and BMI. Blood pressure. Lipid and cholesterol levels. These may be checked every 5 years, or more frequently if you are over 53 years old. Skin check. Lung cancer screening. You may have this screening every year starting at age 17 if you have a 30-pack-year history of smoking and currently smoke or have quit within the past 15 years. Fecal occult blood test (FOBT) of the stool. You may have this test every year starting at age 69. Flexible sigmoidoscopy or colonoscopy. You may have a sigmoidoscopy every 5 years or a colonoscopy every 10 years starting at age 91. Hepatitis C blood test. Hepatitis B blood test. Sexually transmitted disease (STD) testing. Diabetes screening. This is done by checking your blood sugar (glucose) after you have not eaten for a while (fasting). You may have this done every 1-3 years. Bone density scan. This is done to screen for osteoporosis. You may have this done starting at age  65. Mammogram. This may be done every 1-2 years. Talk to your health care provider about how often you should have regular mammograms. Talk with your health care provider about your test results, treatment options, and if necessary, the need for more tests. Vaccines  Your health care provider may recommend certain vaccines, such as: Influenza vaccine. This is  recommended every year. Tetanus, diphtheria, and acellular pertussis (Tdap, Td) vaccine. You may need a Td booster every 10 years. Zoster vaccine. You may need this after age 78. Pneumococcal 13-valent conjugate (PCV13) vaccine. One dose is recommended after age 65. Pneumococcal polysaccharide (PPSV23) vaccine. One dose is recommended after age 55. Talk to your health care provider about which screenings and vaccines you need and how often you need them. This information is not intended to replace advice given to you by your health care provider. Make sure you discuss any questions you have with your health care provider. Document Released: 06/09/2015 Document Revised: 01/31/2016 Document Reviewed: 03/14/2015 Elsevier Interactive Patient Education  2017 [place] Prevention in the Home Falls can cause injuries. They can happen to people of all ages. There are many things you can do to make your home safe and to help prevent falls. What can I do on the outside of my home? Regularly fix the edges of walkways and driveways and fix any cracks. Remove anything that might make you trip as you walk through a door, such as a raised step or threshold. Trim any bushes or trees on the path to your home. Use bright outdoor lighting. Clear any walking paths of anything that might make someone trip, such as rocks or tools. Regularly check to see if handrails are loose or broken. Make sure that both sides of any steps have handrails. Any raised decks and porches should have guardrails on the edges. Have any leaves, snow, or ice cleared regularly. Use sand or salt on walking paths during winter. Clean up any spills in your garage right away. This includes oil or grease spills. What can I do in the bathroom? Use night lights. Install grab bars by the toilet and in the tub and shower. Do not use towel bars as grab bars. Use non-skid mats or decals in the tub or shower. If you need to sit down in  the shower, use a plastic, non-slip stool. Keep the floor dry. Clean up any water that spills on the floor as soon as it happens. Remove soap buildup in the tub or shower regularly. Attach bath mats securely with double-sided non-slip rug tape. Do not have throw rugs and other things on the floor that can make you trip. What can I do in the bedroom? Use night lights. Make sure that you have a light by your bed that is easy to reach. Do not use any sheets or blankets that are too big for your bed. They should not hang down onto the floor. Have a firm chair that has side arms. You can use this for support while you get dressed. Do not have throw rugs and other things on the floor that can make you trip. What can I do in the kitchen? Clean up any spills right away. Avoid walking on wet floors. Keep items that you use a lot in easy-to-reach places. If you need to reach something above you, use a strong step stool that has a grab bar. Keep electrical cords out of the way. Do not use floor polish or wax that makes floors slippery.  If you must use wax, use non-skid floor wax. Do not have throw rugs and other things on the floor that can make you trip. What can I do with my stairs? Do not leave any items on the stairs. Make sure that there are handrails on both sides of the stairs and use them. Fix handrails that are broken or loose. Make sure that handrails are as long as the stairways. Check any carpeting to make sure that it is firmly attached to the stairs. Fix any carpet that is loose or worn. Avoid having throw rugs at the top or bottom of the stairs. If you do have throw rugs, attach them to the floor with carpet tape. Make sure that you have a light switch at the top of the stairs and the bottom of the stairs. If you do not have them, ask someone to add them for you. What else can I do to help prevent falls? Wear shoes that: Do not have high heels. Have rubber bottoms. Are comfortable  and fit you well. Are closed at the toe. Do not wear sandals. If you use a stepladder: Make sure that it is fully opened. Do not climb a closed stepladder. Make sure that both sides of the stepladder are locked into place. Ask someone to hold it for you, if possible. Clearly mark and make sure that you can see: Any grab bars or handrails. First and last steps. Where the edge of each step is. Use tools that help you move around (mobility aids) if they are needed. These include: Canes. Walkers. Scooters. Crutches. Turn on the lights when you go into a dark area. Replace any light bulbs as soon as they burn out. Set up your furniture so you have a clear path. Avoid moving your furniture around. If any of your floors are uneven, fix them. If there are any pets around you, be aware of where they are. Review your medicines with your doctor. Some medicines can make you feel dizzy. This can increase your chance of falling. Ask your doctor what other things that you can do to help prevent falls. This information is not intended to replace advice given to you by your health care provider. Make sure you discuss any questions you have with your health care provider. Document Released: 03/09/2009 Document Revised: 10/19/2015 Document Reviewed: 06/17/2014 Elsevier Interactive Patient Education  2017 [employer].

## 2022-06-20 ENCOUNTER — Encounter: Payer: Self-pay | Admitting: Internal Medicine

## 2022-06-20 ENCOUNTER — Ambulatory Visit (INDEPENDENT_AMBULATORY_CARE_PROVIDER_SITE_OTHER): Payer: Medicare Other | Admitting: Internal Medicine

## 2022-06-20 VITALS — BP 128/70 | HR 72 | Temp 98.2°F | Ht 61.0 in | Wt 117.0 lb

## 2022-06-20 DIAGNOSIS — J42 Unspecified chronic bronchitis: Secondary | ICD-10-CM | POA: Diagnosis not present

## 2022-06-20 DIAGNOSIS — L853 Xerosis cutis: Secondary | ICD-10-CM | POA: Insufficient documentation

## 2022-06-20 DIAGNOSIS — T50Z95A Adverse effect of other vaccines and biological substances, initial encounter: Secondary | ICD-10-CM | POA: Diagnosis not present

## 2022-06-20 NOTE — Progress Notes (Signed)
Subjective:    Patient ID: Diane Gomez, female    DOB: [birth year], 67 y.o.   MRN: [id number]  HPI Here due to reaction to prevnar 20 vaccine Got this 6 days ago Felt "deeper" when it went in Noticed some redness that has been spreading---warm but not really tender Arm was sore for a day--this did go away No fever or systemic symptoms  Has had shingles in the right back --2 years ago? Now has itching on back for a couple of months Some blood on sheets from her scratching Uses zest soap Electric heat---but boils water to put in humidity  Wonders about getting a COPD test Former smoker Regular cough---some phlegm. All year round Breathing is generally okay--but does note she needs to take extra breaths when singing in church  Current Outpatient Medications on File Prior to Visit  Medication Sig Dispense Refill   albuterol (VENTOLIN HFA) 108 (90 Base) MCG/ACT inhaler Inhale 2 puffs into the lungs every 6 (six) hours as needed for wheezing or shortness of breath. 18 g 0   ALPRAZolam (XANAX) 0.5 MG tablet Take 0.5 mg by mouth at bedtime as needed for anxiety.     diltiazem (TIAZAC) 240 MG 24 hr capsule Take 180 mg by mouth daily.     esomeprazole (NEXIUM) 20 MG capsule Take 20 mg by mouth daily.     metoprolol succinate (TOPROL-XL) 50 MG 24 hr tablet Take 50 mg by mouth 2 (two) times daily. Take with or immediately following a meal.     NON FORMULARY Balance of Nature-Fruits and Veggies 3 tablets daily     Probiotic Product (PROBIOTIC-10 PO) Take 1 tablet by mouth daily.     No current facility-administered medications on file prior to visit.    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 Formatting of this note may be different from the original. Skin Cancer History- Non-Melanoma Skin Cancer  Diagnosis Location Biopsy Date Treatment date Procedure Surgeon  [facility] Left temple, scalp  2008 [facility] [place]  [facility] Left temple  2010    [facility] Left neck  2001    [place]  Right upper lip  2007 excision   [facility]  L upper chest  2007 ED&C   [facility]   Hypertension    Osteoporosis 06/2017   T score -2.6   Tumors    liver    Past Surgical History:  Procedure Laterality Date   CESAREAN SECTION     ENDOMETRIAL ABLATION  2009   HER OPTION   HYSTEROSCOPY  2008   MYOMECTOMY   TUBAL LIGATION      Family History  Problem Relation Age of Onset   Hypertension Mother    Parkinsonism Sister        PARKINSON'S DISEASE   Breast  cancer Other        Niece  Age 44   Aneurysm Father    Seizures Sister    Heart disease Brother    Colon cancer Neg Hx    Stomach cancer Neg Hx    Esophageal cancer Neg Hx     Social History   Socioeconomic History   Marital status: Divorced    Spouse name: Not on file   Number of children: Not on file   Years of education: Not on file   Highest education level: Not on file  Occupational History   Not on file  Tobacco Use   Smoking status: Former    Types: Cigarettes   Smokeless tobacco: Never  Vaping Use   Vaping Use: Never used  Substance and Sexual Activity   Alcohol use: Not Currently    Comment: OCC   Drug use: Yes    Frequency: 2.0 times per week    Types: Marijuana    Comment: occasional    Sexual activity: Not Currently    Birth control/protection: Post-menopausal, Surgical  Other Topics Concern   Not on file  Social History Narrative   Not on file   Social Determinants of Health   Financial Resource Strain:  Low Risk  (05/14/2022)   Overall Financial Resource Strain (CARDIA)    Difficulty of Paying Living Expenses: Not hard at all  Food Insecurity: No Food Insecurity (05/14/2022)   Hunger Vital Sign    Worried About Running Out of Food in the Last Year: Never true    [place] in the Last Year: Never true  Transportation Needs: No Transportation Needs (05/14/2022)   PRAPARE - Hydrologist (Medical): No    Lack of Transportation (Non-Medical): No  Physical Activity: Not on file  Stress: No Stress Concern Present (05/14/2022)   [place]    Feeling of Stress : Not at all  Social Connections: Unknown (05/14/2022)   Social Connection and Isolation Panel [NHANES]    Frequency of Communication with Friends and Family: More than three times a week    Frequency of Social Gatherings with Friends and Family: More than three times a week    Attends Religious Services: More than 4 times per year    Active Member of [employer] or Organizations: No    Attends Archivist Meetings: Not on file    Marital Status: Not on file  Intimate Partner Violence: Not At Risk (05/14/2022)   Humiliation, Afraid, Rape, and Kick questionnaire    Fear of Current or Ex-Partner: No    Emotionally Abused: No    Physically Abused: No    Sexually Abused: No    Review of Systems Eats okay--limited by alpha gal Gets diarrhea at times (IBS_D)    Objective:   Physical Exam Constitutional:      Appearance: Normal appearance.  Musculoskeletal:     Comments: 16 x 8 cm pink area on lateral upper left arm Not warm and not really tender Blanches (not blood)  No specific lesions on upper back  Neurological:     Mental Status: She is alert.            Assessment & Plan:

## 2022-06-20 NOTE — Assessment & Plan Note (Signed)
Ongoing symptoms that are not too severe--but goes back several years Wondering about PFTs Not sure she needs Rx Will set up with pulmonary

## 2022-06-20 NOTE — Assessment & Plan Note (Signed)
To Prevnar 20  Not infection Not allergic  Reassured--no action needed

## 2022-06-20 NOTE — Assessment & Plan Note (Signed)
Discussed moisturizing soap Humidifier for when heat is on

## 2022-07-24 ENCOUNTER — Encounter: Payer: Self-pay | Admitting: Student in an Organized Health Care Education/Training Program

## 2022-07-24 ENCOUNTER — Ambulatory Visit
Admission: RE | Admit: 2022-07-24 | Discharge: 2022-07-24 | Disposition: A | Discharge status: Home / Self Care | Payer: Medicare Other | Source: Ambulatory Visit | Attending: Student in an Organized Health Care Education/Training Program | Admitting: Student in an Organized Health Care Education/Training Program

## 2022-07-24 ENCOUNTER — Ambulatory Visit: Payer: Medicare Other | Admitting: Student in an Organized Health Care Education/Training Program

## 2022-07-24 VITALS — BP 132/70 | HR 83 | Temp 97.9°F | Ht 61.0 in | Wt 117.0 lb

## 2022-07-24 DIAGNOSIS — R059 Cough, unspecified: Secondary | ICD-10-CM | POA: Diagnosis not present

## 2022-07-24 DIAGNOSIS — R0602 Shortness of breath: Secondary | ICD-10-CM | POA: Insufficient documentation

## 2022-07-24 MED ORDER — TRELEGY ELLIPTA 100-62.5-25 MCG/ACT IN AEPB: 1.0000 | INHALATION_SPRAY | Freq: Every day | RESPIRATORY_TRACT | 0 refills | Status: DC

## 2022-07-24 NOTE — Progress Notes (Signed)
Synopsis: Referred in for cough by Venia Carbon, MD  Assessment & Plan:   1. Shortness of breath  Patient reports a history of shortness of breath and productive cough in the setting of tobacco and marijuana use concerning for COPD. Physical exam was unremarkable with no wheezing or decreased air entry. Will obtain PFT's (spirometry, lung volumes, DLCO) to assess for obstruction. Furthermore, I will obtain a CXR to rule out any pulmonary infiltrates. I will also give her Trelegy samples to assess for symptomatic improvement. Should she improve with the inhaler, and PFT's show obstruction, will send her a prescription for a longer acting inhaler (LABA/LAMA). Finally, we did discuss harm reduction in the setting of MJ use. I recommended to her that she use edibles rather than inhaled MJ.  - Pulmonary Function Test [facility] Only; Future - DG Chest 2 View; Future - Trial Trelegy 1 puff once daily   Return in about 2 months (around 09/22/2022).  I spent 60 minutes caring for this patient today, including preparing to see the patient, obtaining a medical history , reviewing a separately obtained history, performing a medically appropriate examination and/or evaluation, counseling and educating the patient/family/caregiver, ordering medications, tests, or procedures, documenting clinical information in the electronic health record, and independently interpreting results (not separately reported/billed) and communicating results to the patient/family/caregiver  Armando Reichert, MD [facility] 07/24/2022 4:58 PM    End of visit medications:  Meds ordered this encounter  Medications   Fluticasone-Umeclidin-Vilant (TRELEGY ELLIPTA) 100-62.5-25 MCG/ACT AEPB    Sig: Inhale 1 puff into the lungs daily.    Dispense:  14 each    Refill:  0    Order Specific Question:   Lot Number?    Answer:   [id number]    Order Specific Question:   Expiration Date?    Answer:   10/26/2023    Order  Specific Question:   Manufacturer?    Answer:   [employer] [[id number]]    Order Specific Question:   Quantity    Answer:   2     Current Outpatient Medications:    albuterol (VENTOLIN HFA) 108 (90 Base) MCG/ACT inhaler, Inhale 2 puffs into the lungs every 6 (six) hours as needed for wheezing or shortness of breath., Disp: 18 g, Rfl: 0   ALPRAZolam (XANAX) 0.5 MG tablet, Take 0.5 mg by mouth at bedtime as needed for anxiety., Disp: , Rfl:    diltiazem (TIAZAC) 240 MG 24 hr capsule, Take 180 mg by mouth daily., Disp: , Rfl:    esomeprazole (NEXIUM) 20 MG capsule, Take 20 mg by mouth daily., Disp: , Rfl:    Fluticasone-Umeclidin-Vilant (TRELEGY ELLIPTA) 100-62.5-25 MCG/ACT AEPB, Inhale 1 puff into the lungs daily., Disp: 14 each, Rfl: 0   metoprolol succinate (TOPROL-XL) 50 MG 24 hr tablet, Take 50 mg by mouth 2 (two) times daily. Take with or immediately following a meal., Disp: , Rfl:    NON FORMULARY, Balance of Nature-Fruits and Veggies 3 tablets daily, Disp: , Rfl:    Probiotic Product (PROBIOTIC-10 PO), Take 1 tablet by mouth daily., Disp: , Rfl:    Subjective:   PATIENT ID: [place]: female DOB: May 06, 1956, MRN: [id number]  Chief Complaint  Patient presents with   pulmonary consult    Prod cough with clear sputum and wheezing.     HPI  The patient is a pleasant 67 year old female with a past medical history of CKD who presents to clinic for the evaluation of  cough.  Patient reports that she has been having a cough productive of whitish sputum with associated mild exertional dyspnea for which she is seeking care.  She reports being in her usual state of health and is able to do all the things that she wants to do.  She denies any chest pain, chest tightness, wheezing, fevers, chills, or weight loss.  She has noticed that severe exertion gets her short of breath.  She has also noticed that she is unable to sing as she used to in church secondary to shortness of  breath.  The patient was previously seen by her primary care physician and they discussed the possibility of COPD and possibly proceeding with some advanced testing.  She did not feel that she needed an inhaler at that time.  She does have an albuterol inhaler prescribed as needed that she does not use frequently.  She has not had any COPD exacerbations.  [place]  She has a history of cigarette smoking, quit in the 90s.  She does endorse smoking marijuana, and is a current daily smoker.  She smokes flower, rolled into joints, without a filter.  Ancillary information including prior medications, full medical/surgical/family/social histories, and PFTs (when available) are listed below and have been reviewed.   Review of Systems  Constitutional:  Negative for chills, fever, malaise/fatigue and weight loss.  Respiratory:  Positive for cough, sputum production and shortness of breath. Negative for hemoptysis and wheezing.   Cardiovascular:  Negative for chest pain, leg swelling and PND.  Skin:  Negative for rash.     Objective:   Vitals:   07/24/22 1138  BP: 132/70  Pulse: 83  Temp: 97.9 F (36.6 C)  TempSrc: Temporal  SpO2: 99%  Weight: 117 lb (53.1 kg)  Height: '5\' 1"'$  (1.549 m)   99% on RA  BMI Readings from Last 3 Encounters:  07/24/22 22.11 kg/m  06/20/22 22.11 kg/m  05/14/22 21.54 kg/m   Wt Readings from Last 3 Encounters:  07/24/22 117 lb (53.1 kg)  06/20/22 117 lb (53.1 kg)  05/14/22 114 lb (51.7 kg)    Physical Exam Constitutional:      General: She is not in acute distress.    Appearance: Normal appearance. She is ill-appearing.  HENT:     Head: Normocephalic.     Nose: Nose normal.     Mouth/Throat:     Mouth: Mucous membranes are moist.  Cardiovascular:     Rate and Rhythm: Normal rate and regular rhythm.     Pulses: Normal pulses.     Heart sounds: Normal heart sounds.  Pulmonary:     Effort: Pulmonary effort is normal.     Breath  sounds: Normal breath sounds. No wheezing or rales.  Abdominal:     Palpations: Abdomen is soft.  Neurological:     General: No focal deficit present.     Mental Status: She is alert and oriented to person, place, and time. Mental status is at baseline.       Ancillary Information    Past Medical History:  Diagnosis Date   Alcohol abuse, uncomplicated [id number]   Allergy    Alpha galactosidase deficiency    Anemia    Anxiety    Asthma    from cats and horses only   Cancer ([place]) 15 years ago   melanoma    Fibroid    SUBMUCOUS MYOMA   GERD (gastroesophageal reflux disease)    Heart murmur  History of nonmelanoma skin cancer 06/27/2014   Formatting of this note might be different from the original. Formatting of this note may be different from the original. Skin Cancer History- Non-Melanoma Skin Cancer  Diagnosis Location Biopsy Date Treatment date Procedure Surgeon  [facility] Left temple, scalp  2008 ED&C [place]  [facility] Left temple  2010    [facility] Left neck  2001    [place]  Right upper lip  2007 excision   [facility]  L upper chest  2007 ED&C   [facility]   Hypertension    Osteoporosis 06/2017   T score -2.6   Tumors    liver     Family History  Problem Relation Age of Onset   Hypertension Mother    Parkinsonism Sister        PARKINSON'S DISEASE   Breast cancer Other        Niece  Age 24   Aneurysm Father    Seizures Sister    Heart disease Brother    Colon cancer Neg Hx    Stomach cancer Neg Hx    Esophageal cancer Neg Hx      Past Surgical History:  Procedure Laterality Date   CESAREAN SECTION     ENDOMETRIAL ABLATION  2009   HER OPTION   HYSTEROSCOPY  2008   MYOMECTOMY   TUBAL LIGATION      Social History   Socioeconomic History   Marital status: Divorced    Spouse name: Not on file   Number of children: Not on file   Years of education: Not on file   Highest education level: Not on file  Occupational History   Not on file  Tobacco Use   Smoking status: Former     Packs/day: 1.00    Years: 20.00    Total pack years: 20.00    Types: Cigarettes    Quit date: 58    Years since quitting: 31.1   Smokeless tobacco: Never  Vaping Use   Vaping Use: Never used  Substance and Sexual Activity   Alcohol use: Not Currently    Comment: OCC   Drug use: Yes    Frequency: 2.0 times per week    Types: Marijuana    Comment: occasional    Sexual activity: Not Currently    Birth control/protection: Post-menopausal, Surgical  Other Topics Concern   Not on file  Social History Narrative   Not on file   Social Determinants of Health   Financial Resource Strain: Low Risk  (05/14/2022)   Overall Financial Resource Strain (CARDIA)    Difficulty of Paying Living Expenses: Not hard at all  Food Insecurity: No Food Insecurity (05/14/2022)   Hunger Vital Sign    Worried About Running Out of Food in the Last Year: Never true    Ran Out of Food in the Last Year: Never true  Transportation Needs: No Transportation Needs (05/14/2022)   PRAPARE - Hydrologist (Medical): No    Lack of Transportation (Non-Medical): No  Physical Activity: Not on file  Stress: No Stress Concern Present (05/14/2022)   [place]    Feeling of Stress : Not at all  Social Connections: Unknown (05/14/2022)   Social Connection and Isolation Panel [NHANES]    Frequency of Communication with Friends and Family: More than three times a week    Frequency of Social Gatherings with Friends and Family: More than three times a week  Attends Religious Services: More than 4 times per year    Active Member of Clubs or Organizations: No    Attends Archivist Meetings: Not on file    Marital Status: Not on file  Intimate Partner Violence: Not At Risk (05/14/2022)   Humiliation, Afraid, Rape, and Kick questionnaire    Fear of Current or Ex-Partner: No    Emotionally Abused: No    Physically  Abused: No    Sexually Abused: No     Allergies  Allergen Reactions   Penicillins Hives   Brimonidine     Bit by a tick    Meat [Alpha-Gal]    Other     Cats and horses     CBC    Component Value Date/Time   WBC 6.5 09/04/2021 1054   RBC 4.33 09/04/2021 1054   HGB 12.7 09/04/2021 1054   HGB 14.3 07/08/2013 1149   HCT 38.4 09/04/2021 1054   HCT 42.1 07/08/2013 1149   PLT 365.0 09/04/2021 1054   PLT 253 07/08/2013 1149   MCV 88.7 09/04/2021 1054   MCV 96 07/08/2013 1149   MCH 29.1 08/12/2021 1206   MCHC 33.0 09/04/2021 1054   RDW 13.6 09/04/2021 1054   RDW 14.3 07/08/2013 1149   LYMPHSABS 2.1 09/04/2021 1054   MONOABS 0.7 09/04/2021 1054   EOSABS 0.1 09/04/2021 1054   BASOSABS 0.0 09/04/2021 1054    Pulmonary Functions Testing Results:     No data to display          Outpatient Medications Prior to Visit  Medication Sig Dispense Refill   albuterol (VENTOLIN HFA) 108 (90 Base) MCG/ACT inhaler Inhale 2 puffs into the lungs every 6 (six) hours as needed for wheezing or shortness of breath. 18 g 0   ALPRAZolam (XANAX) 0.5 MG tablet Take 0.5 mg by mouth at bedtime as needed for anxiety.     diltiazem (TIAZAC) 240 MG 24 hr capsule Take 180 mg by mouth daily.     esomeprazole (NEXIUM) 20 MG capsule Take 20 mg by mouth daily.     metoprolol succinate (TOPROL-XL) 50 MG 24 hr tablet Take 50 mg by mouth 2 (two) times daily. Take with or immediately following a meal.     NON FORMULARY Balance of Nature-Fruits and Veggies 3 tablets daily     Probiotic Product (PROBIOTIC-10 PO) Take 1 tablet by mouth daily.     No facility-administered medications prior to visit.

## 2022-07-25 DIAGNOSIS — Z85828 Personal history of other malignant neoplasm of skin: Secondary | ICD-10-CM | POA: Diagnosis not present

## 2022-07-25 DIAGNOSIS — L298 Other pruritus: Secondary | ICD-10-CM | POA: Diagnosis not present

## 2022-07-25 DIAGNOSIS — L538 Other specified erythematous conditions: Secondary | ICD-10-CM | POA: Diagnosis not present

## 2022-07-25 DIAGNOSIS — L820 Inflamed seborrheic keratosis: Secondary | ICD-10-CM | POA: Diagnosis not present

## 2022-07-25 DIAGNOSIS — Z08 Encounter for follow-up examination after completed treatment for malignant neoplasm: Secondary | ICD-10-CM | POA: Diagnosis not present

## 2022-07-25 DIAGNOSIS — Z86006 Personal history of melanoma in-situ: Secondary | ICD-10-CM | POA: Diagnosis not present

## 2022-08-15 ENCOUNTER — Other Ambulatory Visit: Payer: Self-pay | Admitting: Family

## 2022-08-15 DIAGNOSIS — Z1231 Encounter for screening mammogram for malignant neoplasm of breast: Secondary | ICD-10-CM

## 2022-08-23 ENCOUNTER — Telehealth: Payer: Medicare Other | Admitting: Student in an Organized Health Care Education/Training Program

## 2022-08-23 DIAGNOSIS — R0602 Shortness of breath: Secondary | ICD-10-CM

## 2022-08-23 NOTE — Telephone Encounter (Signed)
Ok to send in a prescription for Trelegy 100?

## 2022-08-23 NOTE — Telephone Encounter (Signed)
When I called Diane Gomez about scheduled her PFT she stated that the Trelegy has helped and she has seen improvement. She will need a refill sent to her pharmacy [place]

## 2022-08-26 MED ORDER — UMECLIDINIUM-VILANTEROL 62.5-25 MCG/ACT IN AEPB: 1.0000 | INHALATION_SPRAY | Freq: Every day | RESPIRATORY_TRACT | 11 refills | Status: DC

## 2022-08-26 NOTE — Telephone Encounter (Signed)
The patient called back asking if you wanted her to take the Anoro instead of the Trelegy? Anoro was sent to the pharmacy but she was given samples of Trelegy to try and it helped.

## 2022-08-26 NOTE — Telephone Encounter (Signed)
I have notified the patient. She said the Anoro is $90 and she does not know if she can afford it.  I told her to contact her insurance company and see if there is a cheaper alternative to the Anoro.   She will contact her insurance company and call us back.

## 2022-08-26 NOTE — Telephone Encounter (Signed)
I have notified the patient. Nothing further needed. 

## 2022-08-30 ENCOUNTER — Other Ambulatory Visit (HOSPITAL_COMMUNITY): Payer: Self-pay

## 2022-08-30 NOTE — Telephone Encounter (Signed)
I spoke with the patient. She said she has not been about to get in touch with her insurance regarding the inhaler.  [employer] Team, can you check with her insurance company and see if there is a cheaper alternative to the Anoro? Thank you!

## 2022-08-30 NOTE — Telephone Encounter (Signed)
Per test claims 1 month of Anoro is showing as $47.00/1 month at this time, alternatives in the same class of Stiolto and Bevespi are showing as the same price. Patient could use two different inhalers to complete the LABA/LAMA therapy but this shows that just one of those inhalers are also $47.00/1 month.

## 2022-08-30 NOTE — Telephone Encounter (Signed)
I spoke with the patient's pharmacy. They do have the patient's correct insurance on file and they are still showing a $90 copay.  I notified the patient who was at her pharmacy.  She said she will get back to [place] on what she would like to do.  Nothing further needed.

## 2022-09-19 ENCOUNTER — Ambulatory Visit: Payer: Medicare Other | Attending: Student in an Organized Health Care Education/Training Program

## 2022-09-19 DIAGNOSIS — R0602 Shortness of breath: Secondary | ICD-10-CM | POA: Diagnosis not present

## 2022-09-19 LAB — PULMONARY FUNCTION TEST ARMC ONLY
DL/VA % pred: 65 %
DL/VA: 2.78 ml/min/mmHg/L
DLCO unc % pred: 85 %
DLCO unc: 15.03 ml/min/mmHg
FEF 25-75 Post: 2.1 L/sec
FEF 25-75 Pre: 1.72 L/sec
FEF2575-%Change-Post: 22 %
FEF2575-%Pred-Post: 115 %
FEF2575-%Pred-Pre: 94 %
FEV1-%Change-Post: 4 %
FEV1-%Pred-Post: 124 %
FEV1-%Pred-Pre: 118 %
FEV1-Post: 2.55 L
FEV1-Pre: 2.43 L
FEV1FVC-%Change-Post: 2 %
FEV1FVC-%Pred-Pre: 96 %
FEV6-%Change-Post: 2 %
FEV6-%Pred-Post: 129 %
FEV6-%Pred-Pre: 126 %
FEV6-Post: 3.35 L
FEV6-Pre: 3.27 L
FEV6FVC-%Change-Post: 0 %
FEV6FVC-%Pred-Post: 103 %
FEV6FVC-%Pred-Pre: 103 %
FVC-%Change-Post: 1 %
FVC-%Pred-Post: 124 %
FVC-%Pred-Pre: 122 %
FVC-Post: 3.37 L
FVC-Pre: 3.31 L
Post FEV1/FVC ratio: 76 %
Post FEV6/FVC ratio: 99 %
Pre FEV1/FVC ratio: 74 %
Pre FEV6/FVC Ratio: 99 %
RV % pred: 122 %
RV: 2.45 L
TLC % pred: 123 %
TLC: 5.7 L

## 2022-09-19 MED ORDER — ALBUTEROL SULFATE (2.5 MG/3ML) 0.083% IN NEBU
2.5000 mg | INHALATION_SOLUTION | Freq: Once | RESPIRATORY_TRACT | Status: AC
  Administered 2022-09-19: 2.5 mg via RESPIRATORY_TRACT
  Filled 2022-09-19: qty 3

## 2022-09-23 ENCOUNTER — Encounter: Payer: Self-pay | Admitting: Student in an Organized Health Care Education/Training Program

## 2022-09-23 ENCOUNTER — Ambulatory Visit: Payer: Medicare Other | Admitting: Student in an Organized Health Care Education/Training Program

## 2022-09-23 VITALS — BP 140/78 | HR 55 | Temp 97.1°F | Ht 61.0 in | Wt 117.6 lb

## 2022-09-23 DIAGNOSIS — R0602 Shortness of breath: Secondary | ICD-10-CM

## 2022-09-23 NOTE — Progress Notes (Signed)
Synopsis: Referred in for cough by Mort Sawyers, FNP  Assessment & Plan:   #Shortness of Breath  She presents today for follow up regarding shortness of breath and cough. PFT's performed last week show normal spirometry, lung volumes, and DLCO, and are not consistent with COPD. Physical exam was unremarkable with no wheezing or decreased air entry.   I did explain to Ms. Puckett that while her studies are not consistent with COPD, she did report symptomatic improvement with her inhaler regimen, and should consider using one if it has helped with her breathing. I had sent a prescription for Anoro Ellipta that I recommended she trial, and continue if it helps. Patient will call our office and let us know if she fills the Anoro. Finally, she quit smoking in the 90's and is not eligible for lung cancer screening.  Return in about 1 year (around 09/23/2023).  I spent 30 minutes caring for this patient today, including preparing to see the patient, obtaining a medical history , reviewing a separately obtained history, performing a medically appropriate examination and/or evaluation, counseling and educating the patient/family/caregiver, ordering medications, tests, or procedures, and documenting clinical information in the electronic health record  Raechel Chute, MD [facility] 09/23/2022 10:42 PM    End of visit medications:  No orders of the defined types were placed in this encounter.    Current Outpatient Medications:    albuterol (VENTOLIN HFA) 108 (90 Base) MCG/ACT inhaler, Inhale 2 puffs into the lungs every 6 (six) hours as needed for wheezing or shortness of breath., Disp: 18 g, Rfl: 0   ALPRAZolam (XANAX) 0.5 MG tablet, Take 0.5 mg by mouth at bedtime as needed for anxiety., Disp: , Rfl:    diltiazem (TIAZAC) 240 MG 24 hr capsule, Take 180 mg by mouth daily., Disp: , Rfl:    esomeprazole (NEXIUM) 20 MG capsule, Take 20 mg by mouth daily., Disp: , Rfl:     metoprolol succinate (TOPROL-XL) 50 MG 24 hr tablet, Take 50 mg by mouth 2 (two) times daily. Take with or immediately following a meal., Disp: , Rfl:    NON FORMULARY, Balance of Nature-Fruits and Veggies 3 tablets daily, Disp: , Rfl:    Probiotic Product (PROBIOTIC-10 PO), Take 1 tablet by mouth daily., Disp: , Rfl:    umeclidinium-vilanterol (ANORO ELLIPTA) 62.5-25 MCG/ACT AEPB, Inhale 1 puff into the lungs daily. (Patient not taking: Reported on 09/23/2022), Disp: 30 each, Rfl: 11   Subjective:   PATIENT ID: Diane Gomez GENDER: female DOB: 05-26-1956, MRN: [id number]  Chief Complaint  Patient presents with   Follow-up    Feels like Trelegy helped.  Did not get anoro.    HPI  The patient is a pleasant 67 year old female with a past medical history of CKD who presents to clinic for the evaluation of cough and shortness of breath.  Patient reports that her symptoms are a little improved compared to prior. She does continue to experience mild exertional dyspnea but otherwise is in her usual state of health. She feels well overall. She denies any chest pain, chest tightness, wheezing, fevers, chills, or weight loss.  She has noticed that severe exertion gets her short of breath.  She has also noticed that she is unable to sing as she used to in church secondary to shortness of breath.  Following our last visit, she tried the Trelegy sample and felt that it helped a bit with her symptoms, especially the cough. She did not refill  the Trelegy nor did she get her Anoro Ellipta prescription filled. She did have her PFT's and is presenting to discuss these further.    [place]  She has a history of cigarette smoking, quit in the 90s.  She does endorse smoking marijuana.  She smokes flower, rolled into joints, without a filter. She has been doing more edible THC and less smoking.  Ancillary information including prior medications, full medical/surgical/family/social histories,  and PFTs (when available) are listed below and have been reviewed.   Review of Systems  Constitutional:  Negative for chills, fever, malaise/fatigue and weight loss.  Respiratory:  Positive for cough, sputum production and shortness of breath. Negative for hemoptysis and wheezing.   Cardiovascular:  Negative for chest pain, leg swelling and PND.  Skin:  Negative for rash.     Objective:   Vitals:   09/23/22 1145  BP: (!) 140/78  Pulse: (!) 55  Temp: (!) 97.1 F (36.2 C)  TempSrc: Oral  SpO2: 99%  Weight: 117 lb 9.6 oz (53.3 kg)  Height: 5\' 1"  (1.549 m)   99% on RA BMI Readings from Last 3 Encounters:  09/23/22 22.22 kg/m  07/24/22 22.11 kg/m  06/20/22 22.11 kg/m   Wt Readings from Last 3 Encounters:  09/23/22 117 lb 9.6 oz (53.3 kg)  07/24/22 117 lb (53.1 kg)  06/20/22 117 lb (53.1 kg)    Physical Exam Constitutional:      General: She is not in acute distress.    Appearance: Normal appearance. She is not ill-appearing.  HENT:     Head: Normocephalic.     Nose: Nose normal.     Mouth/Throat:     Mouth: Mucous membranes are moist.  Cardiovascular:     Rate and Rhythm: Normal rate and regular rhythm.     Pulses: Normal pulses.     Heart sounds: Normal heart sounds.  Pulmonary:     Effort: Pulmonary effort is normal.     Breath sounds: Normal breath sounds. No wheezing or rales.  Abdominal:     Palpations: Abdomen is soft.  Neurological:     General: No focal deficit present.     Mental Status: She is alert and oriented to person, place, and time. Mental status is at baseline.       Ancillary Information    Past Medical History:  Diagnosis Date   Alcohol abuse, uncomplicated 11/23/2020   Allergy    Alpha galactosidase deficiency    Anemia    Anxiety    Asthma    from cats and horses only   Cancer (HCC) 15 years ago   melanoma    Fibroid    SUBMUCOUS MYOMA   GERD (gastroesophageal reflux disease)    Heart murmur    History of nonmelanoma  skin cancer 06/27/2014   Formatting of this note might be different from the original. Formatting of this note may be different from the original. Skin Cancer History- Non-Melanoma Skin Cancer  Diagnosis Location Biopsy Date Treatment date Procedure Surgeon  [facility] Left temple, scalp  2008 [facility] [place]  [facility] Left temple  2010    [facility] Left neck  2001    [facility]  Right upper lip  2007 excision   [facility]  L upper chest  2007 ED&C   [facility]   Hypertension    Osteoporosis 06/2017   T score -2.6   Tumors    liver     Family History  Problem Relation Age of  Onset   Hypertension Mother    Parkinsonism Sister        PARKINSON'S DISEASE   Breast cancer Other        Niece  Age 38   Aneurysm Father    Seizures Sister    Heart disease Brother    Colon cancer Neg Hx    Stomach cancer Neg Hx    Esophageal cancer Neg Hx      Past Surgical History:  Procedure Laterality Date   CESAREAN SECTION     ENDOMETRIAL ABLATION  2009   HER OPTION   HYSTEROSCOPY  2008   MYOMECTOMY   TUBAL LIGATION      Social History   Socioeconomic History   Marital status: Divorced    Spouse name: Not on file   Number of children: Not on file   Years of education: Not on file   Highest education level: Not on file  Occupational History   Not on file  Tobacco Use   Smoking status: Former    Packs/day: 1.00    Years: 20.00    Additional pack years: 0.00    Total pack years: 20.00    Types: Cigarettes    Quit date: 2    Years since quitting: 31.3   Smokeless tobacco: Never  Vaping Use   Vaping Use: Never used  Substance and Sexual Activity   Alcohol use: Not Currently    Comment: OCC   Drug use: Yes    Frequency: 2.0 times per week    Types: Marijuana    Comment: occasional    Sexual activity: Not Currently    Birth control/protection: Post-menopausal, Surgical  Other Topics Concern   Not on file  Social History Narrative   Not on file   Social Determinants of Health   Financial Resource Strain: Low Risk   (05/14/2022)   Overall Financial Resource Strain (CARDIA)    Difficulty of Paying Living Expenses: Not hard at all  Food Insecurity: No Food Insecurity (05/14/2022)   Hunger Vital Sign    Worried About Running Out of Food in the Last Year: Never true    Ran Out of Food in the Last Year: Never true  Transportation Needs: No Transportation Needs (05/14/2022)   PRAPARE - Administrator, Civil Service (Medical): No    Lack of Transportation (Non-Medical): No  Physical Activity: Not on file  Stress: No Stress Concern Present (05/14/2022)   [employer] of Occupational Health - Occupational Stress Questionnaire    Feeling of Stress : Not at all  Social Connections: Unknown (05/14/2022)   Social Connection and Isolation Panel [NHANES]    Frequency of Communication with Friends and Family: More than three times a week    Frequency of Social Gatherings with Friends and Family: More than three times a week    Attends Religious Services: More than 4 times per year    Active Member of [employer] or Organizations: No    Attends Banker Meetings: Not on file    Marital Status: Not on file  Intimate Partner Violence: Not At Risk (05/14/2022)   Humiliation, Afraid, Rape, and Kick questionnaire    Fear of Current or Ex-Partner: No    Emotionally Abused: No    Physically Abused: No    Sexually Abused: No     Allergies  Allergen Reactions   Penicillins Hives   Brimonidine     Bit by a tick    Meat [Alpha-Gal]  Other     Cats and horses     CBC    Component Value Date/Time   WBC 6.5 09/04/2021 1054   RBC 4.33 09/04/2021 1054   HGB 12.7 09/04/2021 1054   HGB 14.3 07/08/2013 1149   HCT 38.4 09/04/2021 1054   HCT 42.1 07/08/2013 1149   PLT 365.0 09/04/2021 1054   PLT 253 07/08/2013 1149   MCV 88.7 09/04/2021 1054   MCV 96 07/08/2013 1149   MCH 29.1 08/12/2021 1206   MCHC 33.0 09/04/2021 1054   RDW 13.6 09/04/2021 1054   RDW 14.3 07/08/2013 1149    LYMPHSABS 2.1 09/04/2021 1054   MONOABS 0.7 09/04/2021 1054   EOSABS 0.1 09/04/2021 1054   BASOSABS 0.0 09/04/2021 1054    Pulmonary Functions Testing Results:    Latest Ref Rng & Units 09/19/2022   11:47 AM  PFT Results  FVC-Pre L 3.31   FVC-Predicted Pre % 122   FVC-Post L 3.37   FVC-Predicted Post % 124   Pre FEV1/FVC % % 74   Post FEV1/FCV % % 76   FEV1-Pre L 2.43   FEV1-Predicted Pre % 118   FEV1-Post L 2.55   DLCO uncorrected ml/min/mmHg 15.03   DLCO UNC% % 85   DLVA Predicted % 65   TLC L 5.70   TLC % Predicted % 123   RV % Predicted % 122     Outpatient Medications Prior to Visit  Medication Sig Dispense Refill   albuterol (VENTOLIN HFA) 108 (90 Base) MCG/ACT inhaler Inhale 2 puffs into the lungs every 6 (six) hours as needed for wheezing or shortness of breath. 18 g 0   ALPRAZolam (XANAX) 0.5 MG tablet Take 0.5 mg by mouth at bedtime as needed for anxiety.     diltiazem (TIAZAC) 240 MG 24 hr capsule Take 180 mg by mouth daily.     esomeprazole (NEXIUM) 20 MG capsule Take 20 mg by mouth daily.     metoprolol succinate (TOPROL-XL) 50 MG 24 hr tablet Take 50 mg by mouth 2 (two) times daily. Take with or immediately following a meal.     NON FORMULARY Balance of Nature-Fruits and Veggies 3 tablets daily     Probiotic Product (PROBIOTIC-10 PO) Take 1 tablet by mouth daily.     umeclidinium-vilanterol (ANORO ELLIPTA) 62.5-25 MCG/ACT AEPB Inhale 1 puff into the lungs daily. (Patient not taking: Reported on 09/23/2022) 30 each 11   No facility-administered medications prior to visit.

## 2022-09-25 ENCOUNTER — Ambulatory Visit
Admission: RE | Admit: 2022-09-25 | Discharge: 2022-09-25 | Disposition: A | Discharge status: Home / Self Care | Payer: Medicare Other | Source: Ambulatory Visit | Attending: Family | Admitting: Family

## 2022-09-25 DIAGNOSIS — Z1231 Encounter for screening mammogram for malignant neoplasm of breast: Secondary | ICD-10-CM

## 2022-10-15 DIAGNOSIS — H2513 Age-related nuclear cataract, bilateral: Secondary | ICD-10-CM | POA: Diagnosis not present

## 2022-10-23 DIAGNOSIS — E871 Hypo-osmolality and hyponatremia: Secondary | ICD-10-CM | POA: Diagnosis not present

## 2022-10-23 DIAGNOSIS — I10 Essential (primary) hypertension: Secondary | ICD-10-CM | POA: Diagnosis not present

## 2022-10-23 DIAGNOSIS — D485 Neoplasm of uncertain behavior of skin: Secondary | ICD-10-CM | POA: Diagnosis not present

## 2022-10-23 DIAGNOSIS — Z08 Encounter for follow-up examination after completed treatment for malignant neoplasm: Secondary | ICD-10-CM | POA: Diagnosis not present

## 2022-10-23 DIAGNOSIS — Z85828 Personal history of other malignant neoplasm of skin: Secondary | ICD-10-CM | POA: Diagnosis not present

## 2022-10-23 DIAGNOSIS — D2262 Melanocytic nevi of left upper limb, including shoulder: Secondary | ICD-10-CM | POA: Diagnosis not present

## 2022-10-23 DIAGNOSIS — N281 Cyst of kidney, acquired: Secondary | ICD-10-CM | POA: Diagnosis not present

## 2022-10-23 DIAGNOSIS — L821 Other seborrheic keratosis: Secondary | ICD-10-CM | POA: Diagnosis not present

## 2022-10-23 DIAGNOSIS — L578 Other skin changes due to chronic exposure to nonionizing radiation: Secondary | ICD-10-CM | POA: Diagnosis not present

## 2022-10-23 DIAGNOSIS — Z86006 Personal history of melanoma in-situ: Secondary | ICD-10-CM | POA: Diagnosis not present

## 2022-10-23 DIAGNOSIS — D225 Melanocytic nevi of trunk: Secondary | ICD-10-CM | POA: Diagnosis not present

## 2022-10-23 DIAGNOSIS — N1831 Chronic kidney disease, stage 3a: Secondary | ICD-10-CM | POA: Diagnosis not present

## 2022-10-23 DIAGNOSIS — X32XXXA Exposure to sunlight, initial encounter: Secondary | ICD-10-CM | POA: Diagnosis not present

## 2022-10-28 DIAGNOSIS — N281 Cyst of kidney, acquired: Secondary | ICD-10-CM | POA: Diagnosis not present

## 2022-10-28 DIAGNOSIS — I10 Essential (primary) hypertension: Secondary | ICD-10-CM | POA: Diagnosis not present

## 2022-10-28 DIAGNOSIS — N1831 Chronic kidney disease, stage 3a: Secondary | ICD-10-CM | POA: Diagnosis not present

## 2022-10-28 DIAGNOSIS — E871 Hypo-osmolality and hyponatremia: Secondary | ICD-10-CM | POA: Diagnosis not present

## 2022-11-07 DIAGNOSIS — S93601A Unspecified sprain of right foot, initial encounter: Secondary | ICD-10-CM | POA: Diagnosis not present

## 2022-11-07 DIAGNOSIS — M79671 Pain in right foot: Secondary | ICD-10-CM | POA: Diagnosis not present

## 2022-11-21 DIAGNOSIS — I10 Essential (primary) hypertension: Secondary | ICD-10-CM | POA: Diagnosis not present

## 2022-11-21 DIAGNOSIS — N1831 Chronic kidney disease, stage 3a: Secondary | ICD-10-CM | POA: Diagnosis not present

## 2022-11-21 DIAGNOSIS — E871 Hypo-osmolality and hyponatremia: Secondary | ICD-10-CM | POA: Diagnosis not present

## 2022-11-21 DIAGNOSIS — N281 Cyst of kidney, acquired: Secondary | ICD-10-CM | POA: Diagnosis not present

## 2022-12-12 DIAGNOSIS — D235 Other benign neoplasm of skin of trunk: Secondary | ICD-10-CM | POA: Diagnosis not present

## 2022-12-12 DIAGNOSIS — N281 Cyst of kidney, acquired: Secondary | ICD-10-CM | POA: Diagnosis not present

## 2022-12-12 DIAGNOSIS — I10 Essential (primary) hypertension: Secondary | ICD-10-CM | POA: Diagnosis not present

## 2022-12-12 DIAGNOSIS — N1831 Chronic kidney disease, stage 3a: Secondary | ICD-10-CM | POA: Diagnosis not present

## 2022-12-12 DIAGNOSIS — D225 Melanocytic nevi of trunk: Secondary | ICD-10-CM | POA: Diagnosis not present

## 2022-12-12 DIAGNOSIS — E871 Hypo-osmolality and hyponatremia: Secondary | ICD-10-CM | POA: Diagnosis not present

## 2022-12-26 DIAGNOSIS — D225 Melanocytic nevi of trunk: Secondary | ICD-10-CM | POA: Diagnosis not present

## 2022-12-26 DIAGNOSIS — L905 Scar conditions and fibrosis of skin: Secondary | ICD-10-CM | POA: Diagnosis not present

## 2023-01-15 ENCOUNTER — Telehealth: Payer: Self-pay | Admitting: Family

## 2023-01-15 NOTE — Telephone Encounter (Signed)
Patient has the alpha gal allergy,she has not been tested since 2018. She would like to know where she stand with the allergy,and would like to know if the order can be  placed for her to come and get the labs drawn to see?

## 2023-01-16 NOTE — Telephone Encounter (Signed)
Pt was due for office visit 8/23  Needs to make appt we can discuss further

## 2023-01-16 NOTE — Telephone Encounter (Signed)
Pt scheduled for an appointment 02/03/23

## 2023-02-03 ENCOUNTER — Ambulatory Visit (INDEPENDENT_AMBULATORY_CARE_PROVIDER_SITE_OTHER): Payer: Medicare Other | Admitting: Family

## 2023-02-03 ENCOUNTER — Encounter: Payer: Self-pay | Admitting: Family

## 2023-02-03 VITALS — BP 132/82 | HR 61 | Temp 97.7°F | Ht 61.0 in | Wt 116.6 lb

## 2023-02-03 DIAGNOSIS — N1831 Chronic kidney disease, stage 3a: Secondary | ICD-10-CM

## 2023-02-03 DIAGNOSIS — Z13220 Encounter for screening for lipoid disorders: Secondary | ICD-10-CM

## 2023-02-03 DIAGNOSIS — J4520 Mild intermittent asthma, uncomplicated: Secondary | ICD-10-CM | POA: Diagnosis not present

## 2023-02-03 DIAGNOSIS — Z23 Encounter for immunization: Secondary | ICD-10-CM

## 2023-02-03 DIAGNOSIS — Z91018 Allergy to other foods: Secondary | ICD-10-CM

## 2023-02-03 DIAGNOSIS — T50Z95D Adverse effect of other vaccines and biological substances, subsequent encounter: Secondary | ICD-10-CM

## 2023-02-03 DIAGNOSIS — F1011 Alcohol abuse, in remission: Secondary | ICD-10-CM

## 2023-02-03 DIAGNOSIS — J45909 Unspecified asthma, uncomplicated: Secondary | ICD-10-CM | POA: Insufficient documentation

## 2023-02-03 LAB — LIPID PANEL
Cholesterol: 266 mg/dL — ABNORMAL HIGH (ref 0–200)
HDL: 51.3 mg/dL (ref >39.00)
LDL Cholesterol: 172 mg/dL — ABNORMAL HIGH (ref 0–99)
NonHDL: 215
Total CHOL/HDL Ratio: 5
Triglycerides: 214 mg/dL — ABNORMAL HIGH (ref 0.0–149.0)
VLDL: 42.8 mg/dL — ABNORMAL HIGH (ref 0.0–40.0)

## 2023-02-03 MED ORDER — ALBUTEROL SULFATE HFA 108 (90 BASE) MCG/ACT IN AERS: 2.0000 | INHALATION_SPRAY | Freq: Four times a day (QID) | RESPIRATORY_TRACT | 0 refills | Status: DC | PRN

## 2023-02-03 NOTE — Progress Notes (Signed)
Established Patient Office Visit  Subjective:      CC:  Chief Complaint  Patient presents with   Follow-up    Would like to discuss have certain lab work done, alphagan?    HPI: Diane Gomez is a 67 y.o. female presenting on 02/03/2023 for Follow-up (Would like to discuss have certain lab work done, alphagan?) . 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 Regular f/u with the dermatologist since.   CKD 3: taking farxiga with Dr. Damaris Schooner however her kidney function decreased, last visit 10/28/22, she did start drinking in a matter of two weeks (wine) and her kidney function decreased which scared her and she d/c the drinking. She is pending repeat of her labs today to monitor her kidney function to determine if she can go back on this. She has since been put back on farxiga as her kidney function had improved.    Hyponatremia: chronic, suspected due to chronic alcohol abuse. Being followed by nephrologist.   SOB: recent workup with Dr. Aundria Rud, pulmonary. PFTS with normal spirometry not consistent with COPD, pt was taking anoro elipta but stopped because she didn't feel as though she needed it, she has albuterol inhaler but has not needed. F/u scheduled for one year.   Mammogram utd 09/2022  Dexa, over due last 2019 with osteoporosis . Pt states will get 2025 when she repeat mammo Flu shot, wants to get today.   Lab Results  Component Value Date   HGBA1C 5.6 04/17/2021   Lab Results  Component Value Date   WBC 6.5 09/04/2021   HGB 12.7 09/04/2021   HCT 38.4 09/04/2021   MCV 88.7 09/04/2021   PLT 365.0 09/04/2021      Social history:  Relevant past medical, surgical, family and social history reviewed and updated as indicated. Interim medical history since our last visit reviewed.  Allergies and medications reviewed and updated.  DATA REVIEWED: CHART IN EPIC     ROS: Negative unless specifically indicated above in HPI.    Current Outpatient  Medications:    ALPRAZolam (XANAX) 0.5 MG tablet, Take 0.5 mg by mouth at bedtime as needed for anxiety., Disp: , Rfl:    diltiazem (TIAZAC) 240 MG 24 hr capsule, Take 180 mg by mouth daily., Disp: , Rfl:    esomeprazole (NEXIUM) 20 MG capsule, Take 20 mg by mouth daily., Disp: , Rfl:    metoprolol succinate (TOPROL-XL) 50 MG 24 hr tablet, Take 50 mg by mouth 2 (two) times daily. Take with or immediately following a meal., Disp: , Rfl:    NON FORMULARY, Balance of Nature-Fruits and Veggies 3 tablets daily, Disp: , Rfl:    Probiotic Product (PROBIOTIC-10 PO), Take 1 tablet by mouth daily., Disp: , Rfl:    umeclidinium-vilanterol (ANORO ELLIPTA) 62.5-25 MCG/ACT AEPB, Inhale 1 puff into the lungs daily., Disp: 30 each, Rfl: 11   albuterol (VENTOLIN HFA) 108 (90 Base) MCG/ACT inhaler, Inhale 2 puffs into the lungs every 6 (six) hours as needed for wheezing or shortness of breath., Disp: 18 g, Rfl: 0      Objective:    BP 132/82 (BP Location: Left Arm, Patient Position: Sitting, Cuff Size: Normal)   Pulse 61   Temp 97.7 F (36.5 C) (Temporal)   Ht 5\' 1"  (1.549 m)   Wt 116 lb 9.6 oz (52.9 kg)   SpO2 98%   BMI 22.03 kg/m   Wt Readings from Last 3 Encounters:  02/03/23 116 lb 9.6  oz (52.9 kg)  09/23/22 117 lb 9.6 oz (53.3 kg)  07/24/22 117 lb (53.1 kg)    Physical Exam Vitals reviewed.  Constitutional:      General: She is not in acute distress.    Appearance: Normal appearance. She is normal weight. She is not ill-appearing, toxic-appearing or diaphoretic.  HENT:     Head: Normocephalic.  Cardiovascular:     Rate and Rhythm: Normal rate and regular rhythm.  Pulmonary:     Effort: Pulmonary effort is normal.     Breath sounds: Normal breath sounds.  Musculoskeletal:        General: Normal range of motion.  Neurological:     General: No focal deficit present.     Mental Status: She is alert and oriented to person, place, and time. Mental status is at baseline.  Psychiatric:         Mood and Affect: Mood normal.        Behavior: Behavior normal.        Thought Content: Thought content normal.        Judgment: Judgment normal.           Assessment & Plan:  Chronic kidney disease, stage 3a (HCC) Assessment & Plan: Continue ongoing f/u with nephrology  Continue farxiga as prescribed.    Allergy to alpha-gal Assessment & Plan: Repeat alpha gal pending results.  Pt curious if no longer with ige  Orders: -     Alpha-Gal Panel  Screening for lipoid disorders -     Lipid panel  Mild intermittent reactive airway disease without complication -     Albuterol Sulfate HFA; Inhale 2 puffs into the lungs every 6 (six) hours as needed for wheezing or shortness of breath.  Dispense: 18 g; Refill: 0  Adverse effect of vaccine, subsequent encounter  Alcohol use disorder, mild, in early remission, abuse Assessment & Plan: alcohol cessation instruction/counseling given: counseled patient on the dangers of alcohol abuse/use and advised pt to stop drinking and reviewed strategies to maximize success.          Return in about 1 year (around 02/03/2024) for f/u CPE.  Mort Sawyers, MSN, APRN, FNP-C [facility]

## 2023-02-03 NOTE — Assessment & Plan Note (Signed)
Repeat alpha gal pending results.  Pt curious if no longer with ige

## 2023-02-03 NOTE — Assessment & Plan Note (Signed)
alcohol cessation instruction/counseling given: counseled patient on the dangers of alcohol abuse/use and advised pt to stop drinking and reviewed strategies to maximize success.   

## 2023-02-03 NOTE — Assessment & Plan Note (Signed)
Continue ongoing f/u with nephrology  Continue farxiga as prescribed.

## 2023-02-04 ENCOUNTER — Other Ambulatory Visit: Payer: Self-pay | Admitting: Family

## 2023-02-04 DIAGNOSIS — E782 Mixed hyperlipidemia: Secondary | ICD-10-CM

## 2023-02-04 MED ORDER — ATORVASTATIN CALCIUM 10 MG PO TABS
10.0000 mg | ORAL_TABLET | Freq: Every day | ORAL | 3 refills | Status: DC
Start: 2023-02-04 — End: 2023-06-06

## 2023-02-05 ENCOUNTER — Encounter: Payer: Self-pay | Admitting: Family

## 2023-02-05 DIAGNOSIS — N1831 Chronic kidney disease, stage 3a: Secondary | ICD-10-CM | POA: Diagnosis not present

## 2023-02-05 DIAGNOSIS — I10 Essential (primary) hypertension: Secondary | ICD-10-CM | POA: Diagnosis not present

## 2023-02-05 DIAGNOSIS — E871 Hypo-osmolality and hyponatremia: Secondary | ICD-10-CM | POA: Diagnosis not present

## 2023-02-05 DIAGNOSIS — N281 Cyst of kidney, acquired: Secondary | ICD-10-CM | POA: Diagnosis not present

## 2023-02-12 LAB — ALPHA-GAL PANEL
Allergen, Mutton, f88: <0.1 kU/L
Allergen, Pork, f26: <0.1 kU/L
Beef: <0.1 kU/L
CLASS: 0
CLASS: 0
Class: 0

## 2023-02-12 LAB — INTERPRETATION:

## 2023-03-05 ENCOUNTER — Telehealth: Payer: Self-pay

## 2023-03-05 NOTE — Patient Outreach (Signed)
Care Coordination   03/05/2023 Name: Diane Gomez MRN: [id number] DOB: 04-Oct-1955   Care Coordination Outreach Attempts:  Successful contact made with patient.  Patient states she is not able to talk at this time however request return call from [facility] to further discuss care coordination services.   Follow Up Plan:  Additional outreach attempts will be made to offer the patient care coordination information and services.   Encounter Outcome:  Patient Request to Call Back   Care Coordination Interventions:  No, not indicated    [name] Care Coordination [phone] direct line

## 2023-03-06 ENCOUNTER — Ambulatory Visit: Payer: Self-pay

## 2023-03-06 NOTE — Patient Outreach (Signed)
Care Coordination   03/06/2023 Name: [name] MRN: [id number] DOB: Nov 16, 1955   Care Coordination Outreach Attempts:  An unsuccessful telephone outreach was attempted today to offer the patient information about available care coordination services. HIPAA compliant message left with call back phone number.  Follow Up Plan:  Additional outreach attempts will be made to offer the patient care coordination information and services.   Encounter Outcome:  No Answer   Care Coordination Interventions:  No, not indicated    [name] Care Coordination [phone] direct line

## 2023-03-19 ENCOUNTER — Ambulatory Visit: Payer: Self-pay

## 2023-03-19 NOTE — Patient Outreach (Signed)
Care Coordination   Initial Visit Note   03/19/2023 Name: Diane Gomez MRN: [id number] DOB: [birth year]  Diane Gomez is a 67 y.o. year old female who sees [place], [place], FNP for primary care. I spoke with  Diane Gomez by phone today.  What matters to the patients health and wellness today?  Patient states she recently had lab work that showed she  was negative for Alpha-gal.  Patient reports confirming this with her provider.  Patient denies having any nursing or community resource needs.     Goals Addressed             This Visit's Progress    Care coordination activities - no follow up needed.       Interventions Today    Flowsheet Row Most Recent Value  General Interventions   General Interventions Discussed/Reviewed General Interventions Reviewed, Vaccines  [Care coordination services discussed. SDOH survey completed. AWV discussed and patient advised to contact provider office to schedule. Discussed vaccines. Advised to contact PCP office if care coordination services needed in the future.]  Vaccines Flu, Pneumonia, RSV, Shingles, COVID-19              SDOH assessments and interventions completed:  Yes  SDOH Interventions Today    Flowsheet Row Most Recent Value  SDOH Interventions   Food Insecurity Interventions Intervention Not Indicated  Housing Interventions Intervention Not Indicated  Transportation Interventions Intervention Not Indicated        Care Coordination Interventions:  Yes, provided   Follow up plan: No further intervention required.   Encounter Outcome:  Patient Visit Completed   Diane Gomez [facility] Care Coordination [phone] direct line

## 2023-04-03 DIAGNOSIS — N1831 Chronic kidney disease, stage 3a: Secondary | ICD-10-CM | POA: Diagnosis not present

## 2023-04-03 DIAGNOSIS — E871 Hypo-osmolality and hyponatremia: Secondary | ICD-10-CM | POA: Diagnosis not present

## 2023-04-03 DIAGNOSIS — I10 Essential (primary) hypertension: Secondary | ICD-10-CM | POA: Diagnosis not present

## 2023-04-21 DIAGNOSIS — N1831 Chronic kidney disease, stage 3a: Secondary | ICD-10-CM | POA: Diagnosis not present

## 2023-04-21 DIAGNOSIS — E871 Hypo-osmolality and hyponatremia: Secondary | ICD-10-CM | POA: Diagnosis not present

## 2023-04-21 DIAGNOSIS — I10 Essential (primary) hypertension: Secondary | ICD-10-CM | POA: Diagnosis not present

## 2023-04-21 DIAGNOSIS — N281 Cyst of kidney, acquired: Secondary | ICD-10-CM | POA: Diagnosis not present

## 2023-04-29 DIAGNOSIS — K580 Irritable bowel syndrome with diarrhea: Secondary | ICD-10-CM | POA: Diagnosis not present

## 2023-04-29 DIAGNOSIS — N1831 Chronic kidney disease, stage 3a: Secondary | ICD-10-CM | POA: Diagnosis not present

## 2023-04-29 DIAGNOSIS — N200 Calculus of kidney: Secondary | ICD-10-CM | POA: Diagnosis not present

## 2023-04-29 DIAGNOSIS — E278 Other specified disorders of adrenal gland: Secondary | ICD-10-CM | POA: Diagnosis not present

## 2023-04-29 DIAGNOSIS — I10 Essential (primary) hypertension: Secondary | ICD-10-CM | POA: Diagnosis not present

## 2023-04-29 DIAGNOSIS — E871 Hypo-osmolality and hyponatremia: Secondary | ICD-10-CM | POA: Diagnosis not present

## 2023-04-29 DIAGNOSIS — N281 Cyst of kidney, acquired: Secondary | ICD-10-CM | POA: Diagnosis not present

## 2023-05-12 ENCOUNTER — Ambulatory Visit (INDEPENDENT_AMBULATORY_CARE_PROVIDER_SITE_OTHER): Payer: Medicare Other

## 2023-05-12 VITALS — Ht 61.0 in | Wt 108.0 lb

## 2023-05-12 DIAGNOSIS — Z0000 Encounter for general adult medical examination without abnormal findings: Secondary | ICD-10-CM

## 2023-05-12 NOTE — Patient Instructions (Signed)
Diane Gomez , Thank you for taking time to come for your Medicare Wellness Visit. I appreciate your ongoing commitment to your health goals. Please review the following plan we discussed and let me know if I can assist you in the future.   Referrals/Orders/Follow-Ups/Clinician Recommendations: none  This is a list of the screening recommended for you and due dates:  Health Maintenance  Topic Date Due   Zoster (Shingles) Vaccine (1 of 2) Never done   COVID-19 Vaccine (4 - 2024-25 season) 01/26/2023   Hepatitis C Screening  05/15/2023*   DTaP/Tdap/Td vaccine (2 - Td or Tdap) 05/11/2024   Medicare Annual Wellness Visit  05/11/2024   Mammogram  09/24/2024   Colon Cancer Screening  12/21/2025   Pneumonia Vaccine  Completed   Flu Shot  Completed   DEXA scan (bone density measurement)  Completed   HPV Vaccine  Aged Out  *Topic was postponed. The date shown is not the original due date.    Advanced directives: (Declined) Advance directive discussed with you today. Even though you declined this today, please call our office should you change your mind, and we can give you the proper paperwork for you to fill out.  Next Medicare Annual Wellness Visit scheduled for next year: Yes 05/12/2024 @ 10:50am telephone

## 2023-05-12 NOTE — Progress Notes (Signed)
Subjective:   Diane Gomez is a 67 y.o. female who presents for Medicare Annual (Subsequent) preventive examination.  Visit Complete: Virtual I connected with  Diane Gomez on 05/12/23 by a audio enabled telemedicine application and verified that I am speaking with the correct person using two identifiers.  Patient Location: Home  Provider Location: Office/Clinic  I discussed the limitations of evaluation and management by telemedicine. The patient expressed understanding and agreed to proceed.  Vital Signs: Because this visit was a virtual/telehealth visit, some criteria may be missing or patient reported. Any vitals not documented were not able to be obtained and vitals that have been documented are patient reported.  Patient Medicare AWV questionnaire was completed by the patient on (not done); I have confirmed that all information answered by patient is correct and no changes since this date.  Cardiac Risk Factors include: advanced age (>20men, >1 women);dyslipidemia;hypertension    Objective:    Today's Vitals   05/12/23 1043  Weight: 108 lb (49 kg)  Height: 5\' 1"  (1.549 m)   Body mass index is 20.41 kg/m.     05/12/2023   10:59 AM 05/14/2022   11:50 AM 08/12/2021   12:06 PM 05/07/2020    7:12 PM 01/07/2018    9:53 AM 04/03/2017    2:52 PM 11/29/2016    1:05 PM  Advanced Directives  Does Patient Have a Medical Advance Directive? No No No No No No No  Would patient like information on creating a medical advance directive?  No - Patient declined No - Patient declined No - Patient declined       Current Medications (verified) Outpatient Encounter Medications as of 05/12/2023  Medication Sig   albuterol (VENTOLIN HFA) 108 (90 Base) MCG/ACT inhaler Inhale 2 puffs into the lungs every 6 (six) hours as needed for wheezing or shortness of breath.   ALPRAZolam (XANAX) 0.5 MG tablet Take 0.5 mg by mouth at bedtime as needed for anxiety.   atorvastatin (LIPITOR) 10  MG tablet Take 1 tablet (10 mg total) by mouth daily.   diltiazem (TIAZAC) 240 MG 24 hr capsule Take 180 mg by mouth daily.   esomeprazole (NEXIUM) 20 MG capsule Take 20 mg by mouth daily.   FARXIGA 10 MG TABS tablet Take 10 mg by mouth daily.   metoprolol succinate (TOPROL-XL) 50 MG 24 hr tablet Take 50 mg by mouth 2 (two) times daily. Take with or immediately following a meal.   NON FORMULARY Balance of Nature-Fruits and Veggies 3 tablets daily   Probiotic Product (PROBIOTIC-10 PO) Take 1 tablet by mouth daily.   umeclidinium-vilanterol (ANORO ELLIPTA) 62.5-25 MCG/ACT AEPB Inhale 1 puff into the lungs daily.   No facility-administered encounter medications on file as of 05/12/2023.    Allergies (verified) Penicillins, Brimonidine, Meat [alpha-gal], and Other   History: Past Medical History:  Diagnosis Date   Alcohol abuse, uncomplicated 11/23/2020   Allergy    Alpha galactosidase deficiency    Anemia    Anxiety    Asthma    from cats and horses only   Cancer (HCC) 15 years ago   melanoma    Fibroid    SUBMUCOUS MYOMA   GERD (gastroesophageal reflux disease)    Heart murmur    History of nonmelanoma skin cancer 06/27/2014   Formatting of this note might be different from the original. Formatting of this note may be different from the original. Skin Cancer History- Non-Melanoma Skin Cancer  Diagnosis Location Biopsy Date Treatment  date Procedure Surgeon  [facility] Left temple, scalp  2008 ED&C [place]  [facility] Left temple  2010    [facility] Left neck  2001    [facility]  Right upper lip  2007 excision   [facility]  L upper chest  2007 ED&C   [facility]   Hypertension    Osteoporosis 06/2017   T score -2.6   Tumors    liver   Past Surgical History:  Procedure Laterality Date   CESAREAN SECTION     ENDOMETRIAL ABLATION  2009   HER OPTION   HYSTEROSCOPY  2008   MYOMECTOMY   TUBAL LIGATION     Family History  Problem Relation Age of Onset   Hypertension Mother    Parkinsonism Sister        PARKINSON'S  DISEASE   Breast cancer Other        Niece  Age 72   Aneurysm Father    Seizures Sister    Heart disease Brother    Colon cancer Neg Hx    Stomach cancer Neg Hx    Esophageal cancer Neg Hx    Social History   Socioeconomic History   Marital status: Divorced    Spouse name: Not on file   Number of children: Not on file   Years of education: Not on file   Highest education level: Not on file  Occupational History   Not on file  Tobacco Use   Smoking status: Former    Current packs/day: 0.00    Average packs/day: 1 pack/day for 20.0 years (20.0 ttl pk-yrs)    Types: Cigarettes    Start date: 48    Quit date: 9    Years since quitting: 31.9   Smokeless tobacco: Never  Vaping Use   Vaping status: Never Used  Substance and Sexual Activity   Alcohol use: Not Currently    Comment: OCC   Drug use: Yes    Frequency: 2.0 times per week    Types: Marijuana    Comment: occasional    Sexual activity: Not Currently    Birth control/protection: Post-menopausal, Surgical  Other Topics Concern   Not on file  Social History Narrative   Not on file   Social Drivers of Health   Financial Resource Strain: Low Risk  (05/12/2023)   Overall Financial Resource Strain (CARDIA)    Difficulty of Paying Living Expenses: Not hard at all  Food Insecurity: No Food Insecurity (05/12/2023)   Hunger Vital Sign    Worried About Running Out of Food in the Last Year: Never true    Ran Out of Food in the Last Year: Never true  Transportation Needs: No Transportation Needs (05/12/2023)   PRAPARE - Administrator, Civil Service (Medical): No    Lack of Transportation (Non-Medical): No  Physical Activity: Insufficiently Active (05/12/2023)   Exercise Vital Sign    Days of Exercise per Week: 4 days    Minutes of Exercise per Session: 30 min  Stress: No Stress Concern Present (05/12/2023)   [employer] of Occupational Health - Occupational Stress Questionnaire    Feeling of  Stress : Not at all  Social Connections: Moderately Isolated (05/12/2023)   Social Connection and Isolation Panel [NHANES]    Frequency of Communication with Friends and Family: More than three times a week    Frequency of Social Gatherings with Friends and Family: Twice a week    Attends Religious Services: More than 4 times per year  Active Member of Clubs or Organizations: No    Attends Banker Meetings: Never    Marital Status: Divorced    Tobacco Counseling Counseling given: Not Answered  Clinical Intake:  Pre-visit preparation completed: No  Pain : No/denies pain    BMI - recorded: 20.41 Nutritional Status: BMI of 19-24  Normal Nutritional Risks: None Diabetes: No  How often do you need to have someone help you when you read instructions, pamphlets, or other written materials from your doctor or pharmacy?: 1 - Never  Interpreter Needed?: No  Comments: lives alone Information entered by :: B.Niklas Chretien,LPN   Activities of Daily Living    05/12/2023   10:59 AM 05/14/2022   11:22 AM  In your present state of health, do you have any difficulty performing the following activities:  Hearing? 0 0  Vision? 0 0  Difficulty concentrating or making decisions? 0 0  Walking or climbing stairs? 0 0  Dressing or bathing? 0 0  Doing errands, shopping? 0 0  Preparing Food and eating ? N N  Using the Toilet? N N  In the past six months, have you accidently leaked urine? N N  Comment  Followed by Urology and PCP  Do you have problems with loss of bowel control? N N  Comment  Followed by Gastrenterology and PCP  Managing your Medications? N N  Managing your Finances? N N  Housekeeping or managing your Housekeeping? N N    Patient Care Team: Mort Sawyers, FNP as PCP - General (Family Medicine)  Indicate any recent Medical Services you may have received from other than [facility] providers in the past year (date may be approximate).     Assessment:   This is a  routine wellness examination for [place].  Hearing/Vision screen Hearing Screening - Comments:: Pt says her hearing is good Vision Screening - Comments:: Pt says her vision is fine/[facility]   Goals Addressed             This Visit's Progress    Care coordination activities - no follow up needed.   On track    Interventions Today    Flowsheet Row Most Recent Value  General Interventions   General Interventions Discussed/Reviewed General Interventions Reviewed, Vaccines  [Care coordination services discussed. SDOH survey completed. AWV discussed and patient advised to contact provider office to schedule. Discussed vaccines. Advised to contact PCP office if care coordination services needed in the future.]  Vaccines Flu, Pneumonia, RSV, Shingles, COVID-19           Increase physical activity   Not on track    Volunteer services or connect with others at the [employer].  Walk more for exercise.       Depression Screen    05/12/2023   10:49 AM 02/03/2023    9:33 AM 05/14/2022   11:34 AM 04/17/2021    1:43 PM 12/06/2018    8:05 PM 05/04/2018   11:54 AM 02/18/2017    8:31 AM  PHQ 2/9 Scores  PHQ - 2 Score 2 2 1 2  0 0 0  PHQ- 9 Score 5 9  6        Fall Risk    05/12/2023   10:59 AM 02/03/2023    9:33 AM 05/14/2022   11:40 AM 04/17/2021    1:39 PM 02/18/2017    8:31 AM  Fall Risk   Falls in the past year? 0 0 0 0 No  Number falls in past yr: 0 0 0  0   Injury with Fall? 0 0 0 0   Risk for fall due to : No Fall Risks No Fall Risks     Follow up Education provided;Falls prevention discussed Falls evaluation completed Falls evaluation completed;Falls prevention discussed      MEDICARE RISK AT HOME: Medicare Risk at Home Any stairs in or around the home?: Yes If so, are there any without handrails?: Yes Home free of loose throw rugs in walkways, pet beds, electrical cords, etc?: Yes Adequate lighting in your home to reduce risk of falls?: Yes Life alert?: No Use  of a cane, walker or w/c?: No Grab bars in the bathroom?: Yes Shower chair or bench in shower?: No Elevated toilet seat or a handicapped toilet?: No  TIMED UP AND GO:  Was the test performed?  No    Cognitive Function:        05/12/2023   11:00 AM 05/14/2022   11:49 AM  6CIT Screen  What Year? 0 points 0 points  What month? 0 points 0 points  What time? 0 points 0 points  Count back from 20 0 points 0 points  Months in reverse 0 points 0 points  Repeat phrase 0 points 0 points  Total Score 0 points 0 points    Immunizations Immunization History  Administered Date(s) Administered   Fluad Quad(high Dose 65+) 04/10/2022   Fluad Trivalent(High Dose 65+) 02/03/2023   Influenza Inj Mdck Quad Pf 03/25/2021   Influenza,inj,Quad PF,6+ Mos 03/27/2017, 04/03/2018   Influenza-Unspecified 03/18/2020   Moderna Covid-19 Vaccine Bivalent Booster 50yrs & up 06/07/2020   PFIZER Comirnaty(Gray Top)Covid-19 Tri-Sucrose Vaccine 08/20/2019, 09/10/2019   PNEUMOCOCCAL CONJUGATE-20 06/14/2022   Tdap 05/11/2014    TDAP status: Up to date  Flu Vaccine status: Up to date  Pneumococcal vaccine status: Up to date  Covid-19 vaccine status: Completed vaccines  Qualifies for Shingles Vaccine? Yes   Zostavax completed No   Shingrix Completed?: No.    Education has been provided regarding the importance of this vaccine. Patient has been advised to call insurance company to determine out of pocket expense if they have not yet received this vaccine. Advised may also receive vaccine at local pharmacy or Health Dept. Verbalized acceptance and understanding.  Screening Tests Health Maintenance  Topic Date Due   Zoster Vaccines- Shingrix (1 of 2) Never done   COVID-19 Vaccine (4 - 2024-25 season) 01/26/2023   Hepatitis C Screening  05/15/2023 (Originally 12/04/1973)   DTaP/Tdap/Td (2 - Td or Tdap) 05/11/2024   Medicare Annual Wellness (AWV)  05/11/2024   MAMMOGRAM  09/24/2024   Colonoscopy   12/21/2025   Pneumonia Vaccine 73+ Years old  Completed   INFLUENZA VACCINE  Completed   DEXA SCAN  Completed   HPV VACCINES  Aged Out    Health Maintenance  Health Maintenance Due  Topic Date Due   Zoster Vaccines- Shingrix (1 of 2) Never done   COVID-19 Vaccine (4 - 2024-25 season) 01/26/2023    Colorectal cancer screening: Type of screening: Colonoscopy. Completed 12/21/2020. Repeat every 5 years  Mammogram status: Completed 09/25/2022. Repeat every year  Bone Density status: Completed 07/02/2017. Results reflect: Bone density results: OSTEOPOROSIS. Repeat every 3-5 years.  Lung Cancer Screening: (Low Dose CT Chest recommended if Age 55-80 years, 20 pack-year currently smoking OR have quit w/in 15years.) does not qualify.   Lung Cancer Screening Referral: no  Additional Screening:  Hepatitis C Screening: does not qualify; Completed no  Vision Screening: Recommended annual ophthalmology exams  for early detection of glaucoma and other disorders of the eye. Is the patient up to date with their annual eye exam?  Yes  Who is the provider or what is the name of the office in which the patient attends annual eye exams? [facility] If pt is not established with a provider, would they like to be referred to a provider to establish care? No .   Dental Screening: Recommended annual dental exams for proper oral hygiene  Diabetic Foot Exam: n/a  Community Resource Referral / Chronic Care Management: CRR required this visit?  No   CCM required this visit?  No    Plan:     I have personally reviewed and noted the following in the patient's chart:   Medical and social history Use of alcohol, tobacco or illicit drugs  Current medications and supplements including opioid prescriptions. Patient is not currently taking opioid prescriptions. Functional ability and status Nutritional status Physical activity Advanced directives List of other physicians Hospitalizations, surgeries,  and ER visits in previous 12 months Vitals Screenings to include cognitive, depression, and falls Referrals and appointments  In addition, I have reviewed and discussed with patient certain preventive protocols, quality metrics, and best practice recommendations. A written personalized care plan for preventive services as well as general preventive health recommendations were provided to patient.    Sue Lush, LPN   16/02/9603   After Visit Summary: (MyChart) Due to this being a telephonic visit, the after visit summary with patients personalized plan was offered to patient via MyChart   Nurse Notes: The patient states she is doing well and has no concerns or questions at this time. Pt declines to schedule PE with PCP.

## 2023-05-19 ENCOUNTER — Ambulatory Visit: Payer: Medicare Other

## 2023-06-05 ENCOUNTER — Encounter: Payer: Self-pay | Admitting: Family

## 2023-06-05 DIAGNOSIS — E782 Mixed hyperlipidemia: Secondary | ICD-10-CM

## 2023-06-06 MED ORDER — ATORVASTATIN CALCIUM 10 MG PO TABS: 10.0000 mg | ORAL_TABLET | Freq: Every day | ORAL | 3 refills | Status: DC

## 2023-06-06 NOTE — Addendum Note (Signed)
 Addended by: Mort Sawyers on: 06/06/2023 07:28 AM   Modules accepted: Orders

## 2023-07-29 DIAGNOSIS — Z86006 Personal history of melanoma in-situ: Secondary | ICD-10-CM | POA: Diagnosis not present

## 2023-07-29 DIAGNOSIS — D485 Neoplasm of uncertain behavior of skin: Secondary | ICD-10-CM | POA: Diagnosis not present

## 2023-07-29 DIAGNOSIS — Z85828 Personal history of other malignant neoplasm of skin: Secondary | ICD-10-CM | POA: Diagnosis not present

## 2023-07-29 DIAGNOSIS — L578 Other skin changes due to chronic exposure to nonionizing radiation: Secondary | ICD-10-CM | POA: Diagnosis not present

## 2023-07-29 DIAGNOSIS — Z08 Encounter for follow-up examination after completed treatment for malignant neoplasm: Secondary | ICD-10-CM | POA: Diagnosis not present

## 2023-07-29 DIAGNOSIS — C44519 Basal cell carcinoma of skin of other part of trunk: Secondary | ICD-10-CM | POA: Diagnosis not present

## 2023-07-29 DIAGNOSIS — L821 Other seborrheic keratosis: Secondary | ICD-10-CM | POA: Diagnosis not present

## 2023-07-29 DIAGNOSIS — X32XXXA Exposure to sunlight, initial encounter: Secondary | ICD-10-CM | POA: Diagnosis not present

## 2023-07-29 DIAGNOSIS — R202 Paresthesia of skin: Secondary | ICD-10-CM | POA: Diagnosis not present

## 2023-08-06 DIAGNOSIS — C44519 Basal cell carcinoma of skin of other part of trunk: Secondary | ICD-10-CM | POA: Diagnosis not present

## 2023-10-22 DIAGNOSIS — N1831 Chronic kidney disease, stage 3a: Secondary | ICD-10-CM | POA: Diagnosis not present

## 2023-10-22 DIAGNOSIS — I10 Essential (primary) hypertension: Secondary | ICD-10-CM | POA: Diagnosis not present

## 2023-10-22 DIAGNOSIS — E871 Hypo-osmolality and hyponatremia: Secondary | ICD-10-CM | POA: Diagnosis not present

## 2023-10-22 DIAGNOSIS — E785 Hyperlipidemia, unspecified: Secondary | ICD-10-CM | POA: Diagnosis not present

## 2023-10-29 DIAGNOSIS — N1831 Chronic kidney disease, stage 3a: Secondary | ICD-10-CM | POA: Diagnosis not present

## 2023-10-29 DIAGNOSIS — I10 Essential (primary) hypertension: Secondary | ICD-10-CM | POA: Diagnosis not present

## 2023-10-29 DIAGNOSIS — N200 Calculus of kidney: Secondary | ICD-10-CM | POA: Diagnosis not present

## 2023-10-29 DIAGNOSIS — K580 Irritable bowel syndrome with diarrhea: Secondary | ICD-10-CM | POA: Diagnosis not present

## 2023-10-29 DIAGNOSIS — N281 Cyst of kidney, acquired: Secondary | ICD-10-CM | POA: Diagnosis not present

## 2023-10-29 DIAGNOSIS — E278 Other specified disorders of adrenal gland: Secondary | ICD-10-CM | POA: Diagnosis not present

## 2023-10-29 DIAGNOSIS — E871 Hypo-osmolality and hyponatremia: Secondary | ICD-10-CM | POA: Diagnosis not present

## 2023-12-08 DIAGNOSIS — L905 Scar conditions and fibrosis of skin: Secondary | ICD-10-CM | POA: Diagnosis not present

## 2023-12-08 DIAGNOSIS — D485 Neoplasm of uncertain behavior of skin: Secondary | ICD-10-CM | POA: Diagnosis not present

## 2024-01-28 DIAGNOSIS — H2513 Age-related nuclear cataract, bilateral: Secondary | ICD-10-CM | POA: Diagnosis not present

## 2024-01-28 DIAGNOSIS — H16229 Keratoconjunctivitis sicca, not specified as Sjogren's, unspecified eye: Secondary | ICD-10-CM | POA: Diagnosis not present

## 2024-02-27 DIAGNOSIS — H2513 Age-related nuclear cataract, bilateral: Secondary | ICD-10-CM | POA: Diagnosis not present

## 2024-02-27 DIAGNOSIS — H16229 Keratoconjunctivitis sicca, not specified as Sjogren's, unspecified eye: Secondary | ICD-10-CM | POA: Diagnosis not present

## 2024-02-27 DIAGNOSIS — H5203 Hypermetropia, bilateral: Secondary | ICD-10-CM | POA: Diagnosis not present

## 2024-02-27 DIAGNOSIS — H524 Presbyopia: Secondary | ICD-10-CM | POA: Diagnosis not present

## 2024-04-05 ENCOUNTER — Ambulatory Visit: Payer: Self-pay

## 2024-04-05 NOTE — Telephone Encounter (Signed)
 FYI Only or Action Required?: FYI only for provider: appointment scheduled on 11/11.  Patient was last seen in primary care on 02/03/2023 by Corwin Antu, FNP.  Called Nurse Triage reporting Shoulder Pain.  Symptoms began a week ago.  Interventions attempted: Nothing.  Symptoms are: unchanged.  Triage Disposition: See Physician Within 24 Hours  Patient/caregiver understands and will follow disposition?: Yes          Copied from CRM [id number]. Topic: Clinical - Red Word Triage >> Apr 05, 2024  2:28 PM Eva FALCON wrote: Red Word that prompted transfer to Nurse Triage: tingling in left arm, starting to have pain on shoulder and gets worse at night. Reason for Disposition  Numbness (i.e., loss of sensation) in hand or fingers  Answer Assessment - Initial Assessment Questions 1. ONSET: When did the pain start?     X 1 week   2. LOCATION: Where is the pain located?     Left shoulder pain at night   3. PAIN: How bad is the pain? (Scale 1-10; or mild, moderate, severe)     2/10  4. WORK OR EXERCISE: Has there been any recent work or exercise that involved this part of the body?     No   5. CAUSE: What do you think is causing the shoulder pain?     Unsure   6. OTHER SYMPTOMS: Do you have any other symptoms? (e.g., neck pain, swelling, rash, fever, numbness, weakness)  tingling in left arm-several weeks   Appointment scheduled for evaluation. Patient agrees with plan of care, and will call back if anything changes, or if symptoms worsen.  Protocols used: Shoulder Pain-A-AH

## 2024-04-05 NOTE — Telephone Encounter (Signed)
 NOTED Thank you for seeing her Dr. Cherlyn Cornet

## 2024-04-06 ENCOUNTER — Ambulatory Visit: Payer: Self-pay | Admitting: Family Medicine

## 2024-04-06 ENCOUNTER — Encounter: Payer: Self-pay | Admitting: Family Medicine

## 2024-04-06 ENCOUNTER — Ambulatory Visit: Admitting: Family Medicine

## 2024-04-06 ENCOUNTER — Ambulatory Visit (INDEPENDENT_AMBULATORY_CARE_PROVIDER_SITE_OTHER)
Admission: RE | Admit: 2024-04-06 | Discharge: 2024-04-06 | Disposition: A | Discharge status: Home / Self Care | Source: Ambulatory Visit | Attending: Family Medicine | Admitting: Family Medicine

## 2024-04-06 VITALS — BP 138/70 | HR 68 | Temp 98.1°F | Ht 61.0 in | Wt 120.5 lb

## 2024-04-06 DIAGNOSIS — R202 Paresthesia of skin: Secondary | ICD-10-CM | POA: Insufficient documentation

## 2024-04-06 DIAGNOSIS — R200 Anesthesia of skin: Secondary | ICD-10-CM

## 2024-04-06 LAB — VITAMIN B12: Vitamin B-12: 874 pg/mL (ref 211–911)

## 2024-04-06 LAB — TSH: TSH: 0.67 u[IU]/mL (ref 0.35–5.50)

## 2024-04-06 LAB — FOLATE: Folate: 6.4 ng/mL (ref >5.9)

## 2024-04-06 LAB — HEMOGLOBIN A1C: Hgb A1c MFr Bld: 6.6 % — ABNORMAL HIGH (ref 4.6–6.5)

## 2024-04-06 NOTE — Assessment & Plan Note (Addendum)
 Acute, unclear etiology Negative Spurling test bilateral but cervical radiculopathy possible given description of issue.   Negative Tinel and Phalen test... Doubt carpal tunnel Negative ulnar compression. Will evaluate with plain film of cervical spine to assess for arthritis. Pulses normal bilaterally.  No sign of circulation issue. Possibly secondary to past heavy alcohol use Will evaluate for additional causes with labs including TSH, A1c and B12.  Patient has upcoming labs with renal specialist soon.

## 2024-04-06 NOTE — Progress Notes (Signed)
 Patient ID: Diane Gomez, female    DOB: 10/07/1955, 68 y.o.   MRN: [id number]  This visit was conducted in person.  BP 138/70   Pulse 68   Temp 98.1 F (36.7 C) (Temporal)   Ht 5' 1 (1.549 m)   Wt 120 lb 8 oz (54.7 kg)   SpO2 97%   BMI 22.77 kg/m    CC:  Chief Complaint  Patient presents with   Shoulder Pain    Left   Tingling    Left Arm   Groin Pain    Subjective:   HPI: Diane Gomez is a 68 y.o. female presenting on 04/06/2024 for Shoulder Pain (Left), Tingling (Left Arm), and Groin Pain  Patient of Ginger Patrick, NP]  New left arm tingling.. from shoulder to hand.  In last month.  No neck pain.  No change with movement... not worse with carrying pocket booth.   Posterior shoulder is uncomfortable, up[er back pain.  No weakness in arm.   Occ grabbing pain in  bilateral  hips.SABRA occ gives way. Feels like a cramp.   No past back surgery   No diabetes. Glucose.   No ETOH... but stroing heavy alcohol use in past. Sister with peripheral neuropathy (unclear cause) and diabetes  Relevant past medical, surgical, family and social history reviewed and updated as indicated. Interim medical history since our last visit reviewed. Allergies and medications reviewed and updated. Outpatient Medications Prior to Visit  Medication Sig Dispense Refill   ALPRAZolam (XANAX) 0.5 MG tablet Take 0.5 mg by mouth at bedtime as needed for anxiety.     atorvastatin  (LIPITOR) 10 MG tablet Take 1 tablet (10 mg total) by mouth daily. 90 tablet 3   diltiazem (TIAZAC) 240 MG 24 hr capsule Take 180 mg by mouth daily.     esomeprazole (NEXIUM) 20 MG capsule Take 20 mg by mouth daily.     FARXIGA 10 MG TABS tablet Take 10 mg by mouth daily.     metoprolol succinate (TOPROL-XL) 50 MG 24 hr tablet Take 50 mg by mouth 2 (two) times daily. Take with or immediately following a meal.     NON FORMULARY Balance of Nature-Fruits and Veggies 3 tablets daily     Probiotic Product  (PROBIOTIC-10 PO) Take 1 tablet by mouth daily.     albuterol  (VENTOLIN  HFA) 108 (90 Base) MCG/ACT inhaler Inhale 2 puffs into the lungs every 6 (six) hours as needed for wheezing or shortness of breath. 18 g 0   umeclidinium-vilanterol (ANORO ELLIPTA ) 62.5-25 MCG/ACT AEPB Inhale 1 puff into the lungs daily. 30 each 11   No facility-administered medications prior to visit.     Per HPI unless specifically indicated in ROS section below Review of Systems  Constitutional:  Negative for fatigue and fever.  HENT:  Negative for congestion.   Eyes:  Negative for pain.  Respiratory:  Negative for cough and shortness of breath.   Cardiovascular:  Negative for chest pain, palpitations and leg swelling.  Gastrointestinal:  Negative for abdominal pain.  Genitourinary:  Negative for dysuria and vaginal bleeding.  Musculoskeletal:  Positive for arthralgias. Negative for back pain, gait problem, joint swelling, myalgias, neck pain and neck stiffness.  Neurological:  Positive for numbness. Negative for dizziness, tremors, syncope, weakness, light-headedness and headaches.  Psychiatric/Behavioral:  Negative for dysphoric mood.    Objective:  BP 138/70   Pulse 68   Temp 98.1 F (36.7 C) (Temporal)   Ht 5' 1 (1.549  m)   Wt 120 lb 8 oz (54.7 kg)   SpO2 97%   BMI 22.77 kg/m   Wt Readings from Last 3 Encounters:  04/06/24 120 lb 8 oz (54.7 kg)  05/12/23 108 lb (49 kg)  02/03/23 116 lb 9.6 oz (52.9 kg)      Physical Exam Constitutional:      General: She is not in acute distress.    Appearance: Normal appearance. She is well-developed. She is not ill-appearing or toxic-appearing.  HENT:     Head: Normocephalic.     Right Ear: Hearing, tympanic membrane, ear canal and external ear normal. Tympanic membrane is not erythematous, retracted or bulging.     Left Ear: Hearing, tympanic membrane, ear canal and external ear normal. Tympanic membrane is not erythematous, retracted or bulging.     Nose:  No mucosal edema or rhinorrhea.     Right Sinus: No maxillary sinus tenderness or frontal sinus tenderness.     Left Sinus: No maxillary sinus tenderness or frontal sinus tenderness.     Mouth/Throat:     Pharynx: Uvula midline.  Eyes:     General: Lids are normal. Lids are everted, no foreign bodies appreciated.     Conjunctiva/sclera: Conjunctivae normal.     Pupils: Pupils are equal, round, and reactive to light.  Neck:     Thyroid : No thyroid  mass or thyromegaly.     Vascular: No carotid bruit.     Trachea: Trachea normal.  Cardiovascular:     Rate and Rhythm: Normal rate and regular rhythm.     Pulses: Normal pulses.     Heart sounds: Normal heart sounds, S1 normal and S2 normal. No murmur heard.    No friction rub. No gallop.  Pulmonary:     Effort: Pulmonary effort is normal. No tachypnea or respiratory distress.     Breath sounds: Normal breath sounds. No decreased breath sounds, wheezing, rhonchi or rales.  Abdominal:     General: Bowel sounds are normal.     Palpations: Abdomen is soft.     Tenderness: There is no abdominal tenderness.  Musculoskeletal:     Cervical back: Normal range of motion and neck supple.  Skin:    General: Skin is warm and dry.     Findings: No rash.  Neurological:     Mental Status: She is alert and oriented to person, place, and time.     GCS: GCS eye subscore is 4. GCS verbal subscore is 5. GCS motor subscore is 6.     Cranial Nerves: No cranial nerve deficit.     Sensory: No sensory deficit.     Motor: No abnormal muscle tone.     Coordination: Coordination normal.     Gait: Gait normal.     Deep Tendon Reflexes: Reflexes are normal and symmetric.     Comments: Nml cerebellar exam   Negative Spurling bilaterally Negative ulnar compression Negative Tinel and Phalen  Psychiatric:        Mood and Affect: Mood is not anxious or depressed.        Speech: Speech normal.        Behavior: Behavior normal. Behavior is cooperative.         Thought Content: Thought content normal.        Cognition and Memory: Memory is not impaired. She does not exhibit impaired recent memory or impaired remote memory.        Judgment: Judgment normal.  Results for orders placed or performed in visit on 02/03/23  Lipid panel   Collection Time: 02/03/23  9:20 AM  Result Value Ref Range   Cholesterol 266 (H) 0 - 200 mg/dL   Triglycerides 785.9 (H) 0.0 - 149.0 mg/dL   HDL 48.69 >60.99 mg/dL   VLDL 57.1 (H) 0.0 - 59.9 mg/dL   LDL Cholesterol 827 (H) 0 - 99 mg/dL   Total CHOL/HDL Ratio 5    NonHDL 215.00   Alpha-Gal Panel   Collection Time: 02/03/23  9:20 AM  Result Value Ref Range   Beef <0.10 kU/L   CLASS 0    Allergen, Mutton, f88 <0.10 kU/L   Class 0    Allergen, Pork, f26 <0.10 kU/L   CLASS 0    GALACTOSE-ALPHA-1,3-GALACTOSE IGE* <0.10 <0.10 kU/L  Interpretation:   Collection Time: 02/03/23  9:20 AM  Result Value Ref Range   Interpretation      Assessment and Plan  Numbness and tingling in left arm Assessment & Plan: Acute, unclear etiology Negative Spurling test bilateral but cervical radiculopathy possible given description of issue.   Negative Tinel and Phalen test... Doubt carpal tunnel Negative ulnar compression. Will evaluate with plain film of cervical spine to assess for arthritis. Pulses normal bilaterally.  No sign of circulation issue. Possibly secondary to past heavy alcohol use Will evaluate for additional causes with labs including TSH, A1c and B12.  Patient has upcoming labs with renal specialist soon.   Orders: -     DG Cervical Spine Complete; Future -     TSH -     Vitamin B12 -     Hemoglobin A1c -     Folate    No follow-ups on file.   Greig Ring, MD

## 2024-04-07 ENCOUNTER — Other Ambulatory Visit: Payer: Self-pay | Admitting: Family Medicine

## 2024-04-07 MED ORDER — PREDNISONE 20 MG PO TABS: ORAL_TABLET | ORAL | 0 refills | Status: DC

## 2024-04-14 ENCOUNTER — Other Ambulatory Visit: Payer: Self-pay | Admitting: Physician Assistant

## 2024-04-14 DIAGNOSIS — E785 Hyperlipidemia, unspecified: Secondary | ICD-10-CM

## 2024-04-14 DIAGNOSIS — R9431 Abnormal electrocardiogram [ECG] [EKG]: Secondary | ICD-10-CM

## 2024-04-14 DIAGNOSIS — Z8249 Family history of ischemic heart disease and other diseases of the circulatory system: Secondary | ICD-10-CM

## 2024-04-14 DIAGNOSIS — I10 Essential (primary) hypertension: Secondary | ICD-10-CM

## 2024-04-16 ENCOUNTER — Other Ambulatory Visit

## 2024-04-16 ENCOUNTER — Other Ambulatory Visit: Payer: Self-pay

## 2024-05-12 ENCOUNTER — Ambulatory Visit: Payer: Medicare Other

## 2024-05-12 VITALS — BP 161/93 | HR 67 | Ht 61.0 in | Wt 120.0 lb

## 2024-05-12 DIAGNOSIS — Z0000 Encounter for general adult medical examination without abnormal findings: Secondary | ICD-10-CM

## 2024-05-12 NOTE — Progress Notes (Signed)
 Chief Complaint  Patient presents with   Medicare Wellness     Subjective:   Diane Gomez is a 68 y.o. female who presents for a Medicare Annual Wellness Visit.  Visit info / Clinical Intake: Medicare Wellness Visit Type:: Subsequent Annual Wellness Visit Persons participating in visit and providing information:: patient Medicare Wellness Visit Mode:: Telephone If telephone:: video declined Since this visit was completed virtually, some vitals may be partially provided or unavailable. Missing vitals are due to the limitations of the virtual format.: Unable to obtain vitals - no equipment If Telephone or Video please confirm:: I connected with patient using audio/video enable telemedicine. I verified patient identity with two identifiers, discussed telehealth limitations, and patient agreed to proceed. Patient Location:: home Provider Location:: clinic Interpreter Needed?: No Pre-visit prep was completed: yes AWV questionnaire completed by patient prior to visit?: no Living arrangements:: (!) lives alone Patient's Overall Health Status Rating: (!) fair Typical amount of pain: some Does pain affect daily life?: (!) yes Are you currently prescribed opioids?: no  Dietary Habits and Nutritional Risks How many meals a day?: 2 Eats fruit and vegetables daily?: yes (in capsule: balance of Nature) Most meals are obtained by: preparing own meals In the last 2 weeks, have you had any of the following?: none Diabetic:: no  Functional Status Activities of Daily Living (to include ambulation/medication): Independent Ambulation: Independent Medication Administration: Independent Home Management (perform basic housework or laundry): Independent Manage your own finances?: yes Primary transportation is: driving Concerns about vision?: no *vision screening is required for WTM* Concerns about hearing?: no  Fall Screening Falls in the past year?: 0 Number of falls in past year:  0 Was there an injury with Fall?: 0 Fall Risk Category Calculator: 0 Patient Fall Risk Level: Low Fall Risk  Fall Risk Patient at Risk for Falls Due to: No Fall Risks Fall risk Follow up: Falls evaluation completed; Education provided; Falls prevention discussed  Home and Transportation Safety: All rugs have non-skid backing?: N/A, no rugs All stairs or steps have railings?: yes Grab bars in the bathtub or shower?: yes Have non-skid surface in bathtub or shower?: yes Good home lighting?: yes Regular seat belt use?: yes Hospital stays in the last year:: no  Cognitive Assessment Difficulty concentrating, remembering, or making decisions? : no Will 6CIT or Mini Cog be Completed: yes What year is it?: 0 points What month is it?: 0 points Give patient an address phrase to remember (5 components): [address] [place]  About what time is it?: 0 points Count backwards from 20 to 1: 0 points Say the months of the year in reverse: 0 points Repeat the address phrase from earlier: 4 points 6 CIT Score: 4 points  Advance Directives (For Healthcare) Does Patient Have a Medical Advance Directive?: No Would patient like information on creating a medical advance directive?: Yes (MAU/Ambulatory/Procedural Areas - Information given)  Reviewed/Updated  Reviewed/Updated: Reviewed All (Medical, Surgical, Family, Medications, Allergies, Care Teams, Patient Goals)    Allergies (verified) Penicillins, Prunus persica, Brimonidine, and Other   Current Medications (verified) Outpatient Encounter Medications as of 05/12/2024  Medication Sig   ALPRAZolam (XANAX) 0.5 MG tablet Take 0.5 mg by mouth at bedtime as needed for anxiety.   atorvastatin  (LIPITOR) 10 MG tablet Take 1 tablet (10 mg total) by mouth daily.   diltiazem (TIAZAC) 240 MG 24 hr capsule Take 180 mg by mouth daily.   esomeprazole (NEXIUM) 20 MG capsule Take 20 mg by mouth daily.  FARXIGA 10 MG TABS tablet Take 10 mg by  mouth daily.   metoprolol succinate (TOPROL-XL) 50 MG 24 hr tablet Take 50 mg by mouth 2 (two) times daily. Take with or immediately following a meal.   NON FORMULARY Balance of Nature-Fruits and Veggies 3 tablets daily   Probiotic Product (PROBIOTIC-10 PO) Take 1 tablet by mouth daily.   telmisartan (MICARDIS) 20 MG tablet Take 20 mg by mouth.   predniSONE  (DELTASONE ) 20 MG tablet 3 tabs by mouth daily x 3 days, then 2 tabs by mouth daily x 2 days then 1 tab by mouth daily x 2 days (Patient not taking: Reported on 05/12/2024)   No facility-administered encounter medications on file as of 05/12/2024.    History: Past Medical History:  Diagnosis Date   Alcohol abuse, uncomplicated 11/23/2020   Allergy    Alpha galactosidase deficiency    Anemia    Anxiety    Asthma    from cats and horses only   Cancer (HCC) 15 years ago   melanoma    Fibroid    SUBMUCOUS MYOMA   GERD (gastroesophageal reflux disease)    Heart murmur    History of nonmelanoma skin cancer 06/27/2014   Formatting of this note might be different from the original. Formatting of this note may be different from the original. Skin Cancer History- Non-Melanoma Skin Cancer  Diagnosis Location Biopsy Date Treatment date Procedure Surgeon  [facility] Left temple, scalp  2008 ED&C [place]  [facility] Left temple  2010    [place] Left neck  2001    [facility]  Right upper lip  2007 excision   [facility]  L upper chest  2007 ED&C   [facility]   Hypertension    Osteoporosis 06/2017   T score -2.6   Tumors    liver   Past Surgical History:  Procedure Laterality Date   CESAREAN SECTION     ENDOMETRIAL ABLATION  2009   HER OPTION   HYSTEROSCOPY  2008   MYOMECTOMY   TUBAL LIGATION     Family History  Problem Relation Age of Onset   Hypertension Mother    Parkinsonism Sister        PARKINSON'S DISEASE   Breast cancer Other        Niece  Age 65   Aneurysm Father    Seizures Sister    Heart disease Brother    Colon cancer Neg Hx    Stomach cancer Neg Hx     Esophageal cancer Neg Hx    Social History   Occupational History   Not on file  Tobacco Use   Smoking status: Former    Current packs/day: 0.00    Average packs/day: 1 pack/day for 20.0 years (20.0 ttl pk-yrs)    Types: Cigarettes    Start date: 64    Quit date: 66    Years since quitting: 32.9   Smokeless tobacco: Never  Vaping Use   Vaping status: Never Used  Substance and Sexual Activity   Alcohol use: Not Currently    Comment: OCC   Drug use: Yes    Frequency: 2.0 times per week    Types: Marijuana    Comment: occasional    Sexual activity: Not Currently    Birth control/protection: Post-menopausal, Surgical   Tobacco Counseling Counseling given: Not Answered  SDOH Screenings   Food Insecurity: No Food Insecurity (05/12/2024)  Housing: Unknown (05/12/2024)  Transportation Needs: No Transportation Needs (05/12/2024)  Utilities: Not At Risk (05/12/2024)  Alcohol Screen: Low Risk (05/12/2023)  Depression (PHQ2-9): Low Risk (05/12/2024)  Financial Resource Strain: Low Risk (05/12/2023)  Physical Activity: Insufficiently Active (05/12/2024)  Social Connections: Moderately Isolated (05/12/2024)  Stress: Stress Concern Present (05/12/2024)  Tobacco Use: Medium Risk (05/12/2024)  Health Literacy: Adequate Health Literacy (05/12/2024)   See flowsheets for full screening details  Depression Screen PHQ 2 & 9 Depression Scale- Over the past 2 weeks, how often have you been bothered by any of the following problems? Little interest or pleasure in doing things: 0 Feeling down, depressed, or hopeless (PHQ Adolescent also includes...irritable): 1 PHQ-2 Total Score: 1     Goals Addressed             This Visit's Progress    I would like a short winter and healthy year       I wouolod like to get out of shoulder pain       Increase physical activity   Not on track    Volunteer services or connect with others at the [employer].  Walk more for exercise.              Objective:    Today's Vitals   05/12/24 1055  BP: (!) 161/93  Pulse: 67  Weight: 120 lb (54.4 kg)  Height: 5' 1 (1.549 m)   Body mass index is 22.67 kg/m.  Hearing/Vision screen Vision Screening - Comments:: UTD w/visits to [employer] and Health Maintenance Health Maintenance  Topic Date Due   Hepatitis C Screening  Never done   Zoster Vaccines- Shingrix (1 of 2) Never done   COVID-19 Vaccine (4 - 2025-26 season) 01/26/2024   DTaP/Tdap/Td (2 - Td or Tdap) 05/11/2024   Medicare Annual Wellness (AWV)  05/11/2024   Mammogram  09/24/2024   Colonoscopy  12/21/2025   Pneumococcal Vaccine: 50+ Years  Completed   Influenza Vaccine  Completed   Bone Density Scan  Completed   Meningococcal B Vaccine  Aged Out        Assessment/Plan:  This is a routine wellness examination for Diane Gomez.  Patient Care Team: Corwin Antu, FNP as PCP - General (Family Medicine) Pa, [facility]) Dominica Brandy, MD as Consulting Physician (Nephrology) Ammon Blunt, MD as Consulting Physician (Cardiology)  I have personally reviewed and noted the following in the patients chart:   Medical and social history Use of alcohol, tobacco or illicit drugs  Current medications and supplements including opioid prescriptions. Functional ability and status Nutritional status Physical activity Advanced directives List of other physicians Hospitalizations, surgeries, and ER visits in previous 12 months Vitals Screenings to include cognitive, depression, and falls Referrals and appointments  No orders of the defined types were placed in this encounter.  In addition, I have reviewed and discussed with patient certain preventive protocols, quality metrics, and best practice recommendations. A written personalized care plan for preventive services as well as general preventive health recommendations were provided to patient.   Diane LITTIE Saris,  LPN   [id number]   No follow-ups on file.  After Visit Summary: (MyChart) Due to this being a telephonic visit, the after visit summary with patients personalized plan was offered to patient via MyChart   Nurse Notes: Patient advised to keep follow-up appointment with PCP (05/28/24-made for shoulder pain per pt request) Appointment(s) made: (AWV/CPE Dec 2026) HM Addressed: Vaccines Due: desires no Covid or Shingles vaccine at this time

## 2024-05-12 NOTE — Patient Instructions (Signed)
 Diane Gomez,  Thank you for taking the time for your Medicare Wellness Visit. I appreciate your continued commitment to your health goals. Please review the care plan we discussed, and feel free to reach out if I can assist you further.  Please note that Annual Wellness Visits do not include a physical exam. Some assessments may be limited, especially if the visit was conducted virtually. If needed, we may recommend an in-person follow-up with your provider.  Ongoing Care Seeing your primary care provider every 3 to 6 months helps us  monitor your health and provide consistent, personalized care.   Referrals If a referral was made during today's visit and you haven't received any updates within two weeks, please contact the referred provider directly to check on the status.  Recommended Screenings:  Health Maintenance  Topic Date Due   Hepatitis C Screening  Never done   Zoster (Shingles) Vaccine (1 of 2) Never done   COVID-19 Vaccine (4 - 2025-26 season) 01/26/2024   DTaP/Tdap/Td vaccine (2 - Td or Tdap) 05/11/2024   Medicare Annual Wellness Visit  05/11/2024   Breast Cancer Screening  09/24/2024   Colon Cancer Screening  12/21/2025   Pneumococcal Vaccine for age over 63  Completed   Flu Shot  Completed   Osteoporosis screening with Bone Density Scan  Completed   Meningitis B Vaccine  Aged Out       05/12/2024   11:01 AM  Advanced Directives  Does Patient Have a Medical Advance Directive? No  Would patient like information on creating a medical advance directive? Yes (MAU/Ambulatory/Procedural Areas - Information given)    Vision: Annual vision screenings are recommended for early detection of glaucoma, cataracts, and diabetic retinopathy. These exams can also reveal signs of chronic conditions such as diabetes and high blood pressure.  Dental: Annual dental screenings help detect early signs of oral cancer, gum disease, and other conditions linked to overall health, including  heart disease and diabetes.

## 2024-05-24 ENCOUNTER — Other Ambulatory Visit: Payer: Self-pay | Admitting: Family

## 2024-05-24 ENCOUNTER — Telehealth: Payer: Self-pay | Admitting: Family

## 2024-05-24 DIAGNOSIS — E782 Mixed hyperlipidemia: Secondary | ICD-10-CM

## 2024-05-24 MED ORDER — ATORVASTATIN CALCIUM 10 MG PO TABS: 10.0000 mg | ORAL_TABLET | Freq: Every day | ORAL | 0 refills | Status: AC

## 2024-05-24 NOTE — Telephone Encounter (Signed)
 Patient called requesting refill states she is coming in on Friday, and if labs need to be done that her kidney doctor has already done those labs.  States she needs to be able to pick up medication on Friday the pharmacy is closed on the weekend.    Prescription Request  05/24/2024  LOV: Visit date not found  What is the name of the medication or equipment?  atorvastatin  (LIPITOR) 10 MG tablet  Have you contacted your pharmacy to request a refill? Yes   Which pharmacy would you like this sent to?  [address] [facility] - [place], [place] - [address] [place] [address] Phone: [phone] Fax: [phone]    Patient notified that their request is being sent to the clinical staff for review and that they should receive a response within 2 business days.   Please advise at Mobile [phone] (mobile)

## 2024-05-24 NOTE — Addendum Note (Signed)
 Addended by: CORWIN ANTU on: 05/24/2024 12:32 PM   Modules accepted: Orders

## 2024-05-28 ENCOUNTER — Encounter: Payer: Self-pay | Admitting: Family

## 2024-05-28 ENCOUNTER — Ambulatory Visit: Admitting: Family

## 2024-05-28 VITALS — BP 146/72 | HR 60 | Temp 98.7°F | Ht 61.0 in | Wt 121.6 lb

## 2024-05-28 DIAGNOSIS — Z79899 Other long term (current) drug therapy: Secondary | ICD-10-CM

## 2024-05-28 DIAGNOSIS — M5030 Other cervical disc degeneration, unspecified cervical region: Secondary | ICD-10-CM

## 2024-05-28 DIAGNOSIS — N289 Disorder of kidney and ureter, unspecified: Secondary | ICD-10-CM | POA: Diagnosis not present

## 2024-05-28 DIAGNOSIS — F1011 Alcohol abuse, in remission: Secondary | ICD-10-CM

## 2024-05-28 DIAGNOSIS — E782 Mixed hyperlipidemia: Secondary | ICD-10-CM

## 2024-05-28 DIAGNOSIS — M791 Myalgia, unspecified site: Secondary | ICD-10-CM

## 2024-05-28 DIAGNOSIS — I700 Atherosclerosis of aorta: Secondary | ICD-10-CM

## 2024-05-28 DIAGNOSIS — E279 Disorder of adrenal gland, unspecified: Secondary | ICD-10-CM | POA: Diagnosis not present

## 2024-05-28 DIAGNOSIS — R911 Solitary pulmonary nodule: Secondary | ICD-10-CM | POA: Diagnosis not present

## 2024-05-28 DIAGNOSIS — K769 Liver disease, unspecified: Secondary | ICD-10-CM

## 2024-05-28 LAB — HEPATIC FUNCTION PANEL
ALT: 13 U/L (ref 3–35)
AST: 16 U/L (ref 5–37)
Albumin: 4.6 g/dL (ref 3.5–5.2)
Alkaline Phosphatase: 82 U/L (ref 39–117)
Bilirubin, Direct: 0.1 mg/dL (ref 0.1–0.3)
Total Bilirubin: 0.4 mg/dL (ref 0.2–1.2)
Total Protein: 7.6 g/dL (ref 6.0–8.3)

## 2024-05-28 LAB — LIPID PANEL
Cholesterol: 186 mg/dL (ref 28–200)
HDL: 59 mg/dL
LDL Cholesterol: 86 mg/dL (ref 10–99)
NonHDL: 127.14
Total CHOL/HDL Ratio: 3
Triglycerides: 206 mg/dL — ABNORMAL HIGH (ref 10.0–149.0)
VLDL: 41.2 mg/dL — ABNORMAL HIGH (ref 0.0–40.0)

## 2024-05-28 MED ORDER — TIZANIDINE HCL 4 MG PO TABS: ORAL_TABLET | ORAL | 0 refills | Status: DC

## 2024-05-28 NOTE — Patient Instructions (Signed)
 Diane Gomez

## 2024-05-28 NOTE — Progress Notes (Addendum)
 "  Established Patient Office Visit  Subjective:      CC:  Chief Complaint  Patient presents with   Shoulder Pain    L shoulder    HPI: Diane Gomez is a 69 y.o. female presenting on 05/28/2024 for Shoulder Pain (L shoulder) .  Discussed the use of AI scribe software for clinical note transcription with the patient, who gave verbal consent to proceed.  History of Present Illness Diane Gomez is a 69 year old female with hypertension and kidney disease who presents with left shoulder numbness and tingling.  She has been experiencing persistent numbness in her left shoulder for approximately three weeks. The sensation is described as almost numb, allowing her to use her arm without restriction. She can apply pressure without feeling it. There is no associated rash or tingling to the touch, and the numbness does not worsen with shoulder movement or carrying objects. Heat application provides inconsistent relief, and she has not tried lidocaine patches.  In addition to the numbness, she experiences tingling from her left shoulder down to her hand, present for the last month. The tingling is annoying but not painful, occurring more prominently when she stops moving. The sensation extends to her fingers and is present at night, often causing her to change positions while sleeping.  Her past medical history includes hypertension, for which she takes medication. She recently faced difficulty obtaining her usual blood pressure medication, Tiazac, and had to use an older supply temporarily. She monitors her blood pressure regularly, noting fluctuations with higher readings in the morning. She also has a history of kidney disease currently followed by nephrology.  She has a history of smoking, having quit in 1993, but currently smokes marijuana. She is cautious about using NSAIDs due to her kidney condition and prefers Tylenol , although she is concerned about its liver  warning.         Social history:  Relevant past medical, surgical, family and social history reviewed and updated as indicated. Interim medical history since our last visit reviewed.  Allergies and medications reviewed and updated.  DATA REVIEWED: CHART IN EPIC     ROS: Negative unless specifically indicated above in HPI.   Current Medications[1]        Objective:        BP (!) 146/72 (BP Location: Left Arm, Patient Position: Sitting, Cuff Size: Normal)   Pulse 60   Temp 98.7 F (37.1 C) (Temporal)   Ht 5' 1 (1.549 m)   Wt 121 lb 9.6 oz (55.2 kg)   SpO2 98%   BMI 22.98 kg/m   Physical Exam MUSCULOSKELETAL: Right shoulder range of motion and external rotation without pain. Muscle tightness in the shoulder. No cervical spine tenderness. Deep ache in shoulder without specific tenderness. NEUROLOGICAL: Good grip strength.  Wt Readings from Last 3 Encounters:  05/28/24 121 lb 9.6 oz (55.2 kg)  05/12/24 120 lb (54.4 kg)  04/06/24 120 lb 8 oz (54.7 kg)    Physical Exam Constitutional:      General: She is not in acute distress.    Appearance: Normal appearance. She is normal weight. She is not ill-appearing, toxic-appearing or diaphoretic.  HENT:     Head: Normocephalic.  Cardiovascular:     Rate and Rhythm: Normal rate.  Pulmonary:     Effort: Pulmonary effort is normal.  Musculoskeletal:        General: Normal range of motion.     Right shoulder: No tenderness. Normal range of  motion.     Left shoulder: No tenderness. Normal range of motion. Normal strength.     Left hand: Normal strength.     Cervical back: No rigidity. Muscular tenderness (right posterior) present. No pain with movement or spinous process tenderness. Normal range of motion.     Comments: Muscle tenderness right lower scapula with muscle tightness noted  Neurological:     General: No focal deficit present.     Mental Status: She is alert and oriented to person, place, and time.  Mental status is at baseline.  Psychiatric:        Mood and Affect: Mood normal.        Behavior: Behavior normal.        Thought Content: Thought content normal.        Judgment: Judgment normal.          Results Labs B12: Within normal limits A1c (02/2024): Within normal limits  Radiology MRI abdomen: Prominent focal hepatic steatosis measuring 1.9 to 1.3 cm, benign appearance. Small cyst along inferior edge of right hepatic lobe with subtle septation, no appreciable enhancement. Gallbladder unremarkable. Small left adrenal nodule with subjective signal dropout, compatible with small adenoma. Left mid to lower pole renal lesion with linear internal septation, no enhancement, compatible with benign Bosniak category II cyst. Left mid kidney scarring. Small right kidney lower pole cyst measuring 5 x 7 mm. Hepatic, left adrenal, and left renal lesions with benign imaging characteristics, no further workup required. CT chest (2023): No evidence of chronic obstructive pulmonary disease. Lung nodule in left lower lobe; recommended repeat in six months for stability.  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 No rash or acute injury. Symptoms exacerbated by rest, relieved by heat. Muscle tightness present, no significant pain with range of motion. Differential includes muscle spasm due to neck tightness. - Prescribed muscle relaxer as needed tizanidine  lower doses preferred due to kidney status  - Recommended lidocaine patches for localized tenderness. - Advised use of heat therapy. - Suggested massage or use of a massage gun for muscle tightness. - If symptoms persist, will consider referral to a neuro spine for further evaluation or consider MRI  Benign hepatic, renal, and adrenal  lesions with hepatic steatosis Benign hepatic, renal, and adrenal lesions with hepatic steatosis. Previous imaging showed benign characteristics. No further workup required at this time. - Ordered liver function tests.  Solitary pulmonary nodule Left lower lobe. Previous recommendation for follow-up imaging not completed. - Ordered CT of the chest w/o for follow-up imaging.  Mixed hyperlipidemia Requires monitoring. - Ordered cholesterol panel.  Alcohol use disorder in sustained remission Alcohol use disorder in sustained remission since 2023. No current alcohol consumption reported.  Prediabetes Recent A1c check within the last three months. Discussed potential use of turmeric for blood pressure and sugar control, but advised against due to kidney function concerns. - Advised against turmeric use due to potential kidney impact.  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 Advised to avoid NSAIDs due to kidney function. Tylenol  preferred over NSAIDs. 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 Managed with live probiotic, which has been effective. - Continue current probiotic  regimen.        Return in about 3 weeks (around 06/18/2024) for f/u pains .     Ginger Patrick, MSN, APRN, FNP-C [facility]        [1]  Current Outpatient Medications:    ALPRAZolam (XANAX) 0.5 MG tablet, Take 0.5 mg by mouth at bedtime as needed for anxiety., Disp: , Rfl:    atorvastatin  (LIPITOR) 10 MG tablet, Take 1 tablet (10 mg total) by mouth daily., Disp: 90 tablet, Rfl: 0   diltiazem (TIAZAC) 240 MG 24 hr capsule, Take 180 mg by mouth daily., Disp: , Rfl:    esomeprazole (NEXIUM) 20 MG capsule, Take 20 mg by mouth daily., Disp: , Rfl:    FARXIGA 10 MG TABS  tablet, Take 10 mg by mouth daily., Disp: , Rfl:    metoprolol succinate (TOPROL-XL) 50 MG 24 hr tablet, Take 50 mg by mouth 2 (two) times daily. Take with or immediately following a meal., Disp: , Rfl:    NON FORMULARY, Balance of Nature-Fruits and Veggies 3 tablets daily, Disp: , Rfl:    Probiotic Product (PROBIOTIC-10 PO), Take 1 tablet by mouth daily., Disp: , Rfl:    telmisartan (MICARDIS) 20 MG tablet, Take 20 mg by mouth., Disp: , Rfl:    tiZANidine  (ZANAFLEX ) 4 MG tablet, Take 1/2 to one tablet po at bedtime prn muscle spasm, Disp: 20 tablet, Rfl: 0  "

## 2024-05-30 ENCOUNTER — Ambulatory Visit: Payer: Self-pay | Admitting: Family

## 2024-06-01 ENCOUNTER — Encounter: Payer: Self-pay | Admitting: Family

## 2024-06-01 ENCOUNTER — Telehealth: Payer: Self-pay | Admitting: Family

## 2024-06-01 ENCOUNTER — Other Ambulatory Visit: Payer: Self-pay | Admitting: Family

## 2024-06-01 NOTE — Telephone Encounter (Signed)
 Has she had any relief with tylenol  arthritis? Lidocaine patches and or heat? Is she still having hives or have those resolved with stop of her medication?   See if we can get her on Dr. Eleanore schedule for evaluation. I still suspect it's her neck

## 2024-06-01 NOTE — Telephone Encounter (Signed)
 Copied from CRM #[id number]. Topic: Clinical - Refused Triage >> Jun 01, 2024 11:09 AM Alfonso ORN wrote: Patient/caller voiced complaints of muscle relaxer prescribed , sunday started allergic reaction , itching , broke out with hives last night.       pt wont be taking medication any longer .pt requesting new medication for shoulder pain and requesting a callback with update from office. Declined transfer to triage.

## 2024-06-01 NOTE — Telephone Encounter (Signed)
 Please see note regarding pt; routed to [facility] in error

## 2024-06-01 NOTE — Telephone Encounter (Unsigned)
 Copied from CRM #[id number]. Topic: Clinical - Refused Triage >> Jun 01, 2024 11:09 AM Alfonso ORN wrote: Patient/caller voiced complaints of muscle relaxer prescribed , sunday started allergic reaction , itching , broke out with hives last night.       pt wont be taking medication any longer .pt requesting new medication for shoulder pain and requesting a callback with update from office. Declined transfer to triage.

## 2024-06-01 NOTE — Telephone Encounter (Signed)
 Pt refuse nurse triage call

## 2024-06-02 NOTE — Telephone Encounter (Signed)
 Spoke with pt. States that heat helps at times. She has not tried tylenol  arthritis or lidocaine patches. Pt asks to have her appointment with Tabitha on 06/17/24 be rescheduled with Dr. Watt. This has been done. She will call if things get worse.

## 2024-06-14 ENCOUNTER — Ambulatory Visit: Payer: Self-pay

## 2024-06-14 ENCOUNTER — Emergency Department: Admission: EM | Admit: 2024-06-14 | Discharge: 2024-06-14 | Disposition: A | Discharge status: Home / Self Care

## 2024-06-14 ENCOUNTER — Emergency Department

## 2024-06-14 ENCOUNTER — Other Ambulatory Visit: Payer: Self-pay

## 2024-06-14 DIAGNOSIS — I129 Hypertensive chronic kidney disease with stage 1 through stage 4 chronic kidney disease, or unspecified chronic kidney disease: Secondary | ICD-10-CM | POA: Diagnosis not present

## 2024-06-14 DIAGNOSIS — M5431 Sciatica, right side: Secondary | ICD-10-CM | POA: Insufficient documentation

## 2024-06-14 DIAGNOSIS — M25551 Pain in right hip: Secondary | ICD-10-CM | POA: Diagnosis present

## 2024-06-14 DIAGNOSIS — N189 Chronic kidney disease, unspecified: Secondary | ICD-10-CM | POA: Diagnosis not present

## 2024-06-14 DIAGNOSIS — M79604 Pain in right leg: Secondary | ICD-10-CM

## 2024-06-14 LAB — CBC WITH DIFFERENTIAL/PLATELET
Abs Immature Granulocytes: 0.02 K/uL (ref 0.00–0.07)
Basophils Absolute: 0 K/uL (ref 0.0–0.1)
Basophils Relative: 0 %
Eosinophils Absolute: 0.2 K/uL (ref 0.0–0.5)
Eosinophils Relative: 2 %
HCT: 37.3 % (ref 36.0–46.0)
Hemoglobin: 12.1 g/dL (ref 12.0–15.0)
Immature Granulocytes: 0 %
Lymphocytes Relative: 32 %
Lymphs Abs: 2.3 K/uL (ref 0.7–4.0)
MCH: 24.8 pg — ABNORMAL LOW (ref 26.0–34.0)
MCHC: 32.4 g/dL (ref 30.0–36.0)
MCV: 76.4 fL — ABNORMAL LOW (ref 80.0–100.0)
Monocytes Absolute: 0.6 K/uL (ref 0.1–1.0)
Monocytes Relative: 8 %
Neutro Abs: 4.2 K/uL (ref 1.7–7.7)
Neutrophils Relative %: 58 %
Platelets: 408 K/uL — ABNORMAL HIGH (ref 150–400)
RBC: 4.88 MIL/uL (ref 3.87–5.11)
RDW: 17.6 % — ABNORMAL HIGH (ref 11.5–15.5)
WBC: 7.2 K/uL (ref 4.0–10.5)
nRBC: 0 % (ref 0.0–0.2)

## 2024-06-14 LAB — BASIC METABOLIC PANEL WITH GFR
Anion gap: 12 (ref 5–15)
BUN: 19 mg/dL (ref 8–23)
CO2: 21 mmol/L — ABNORMAL LOW (ref 22–32)
Calcium: 9.2 mg/dL (ref 8.9–10.3)
Chloride: 99 mmol/L (ref 98–111)
Creatinine, Ser: 1.2 mg/dL — ABNORMAL HIGH (ref 0.44–1.00)
GFR, Estimated: 48 mL/min — ABNORMAL LOW
Glucose, Bld: 100 mg/dL — ABNORMAL HIGH (ref 70–99)
Potassium: 5 mmol/L (ref 3.5–5.1)
Sodium: 133 mmol/L — ABNORMAL LOW (ref 135–145)

## 2024-06-14 LAB — PROTIME-INR
INR: 1 (ref 0.8–1.2)
Prothrombin Time: 13.4 s (ref 11.4–15.2)

## 2024-06-14 MED ORDER — ACETAMINOPHEN 500 MG PO TABS: 1000.0000 mg | ORAL_TABLET | Freq: Four times a day (QID) | ORAL | 2 refills | Status: DC | PRN

## 2024-06-14 MED ORDER — LIDOCAINE 5 % EX PTCH: 1.0000 | MEDICATED_PATCH | CUTANEOUS | 0 refills | Status: DC

## 2024-06-14 MED ORDER — LIDOCAINE 5 % EX PTCH
1.0000 | MEDICATED_PATCH | CUTANEOUS | Status: DC
  Administered 2024-06-14: 1 via TRANSDERMAL
  Filled 2024-06-14: qty 1

## 2024-06-14 MED ORDER — GABAPENTIN 100 MG PO CAPS: 100.0000 mg | ORAL_CAPSULE | Freq: Three times a day (TID) | ORAL | 0 refills | Status: AC

## 2024-06-14 MED ORDER — ACETAMINOPHEN 500 MG PO TABS
1000.0000 mg | ORAL_TABLET | Freq: Once | ORAL | Status: AC
  Administered 2024-06-14: 1000 mg via ORAL
  Filled 2024-06-14: qty 2

## 2024-06-14 NOTE — Discharge Instructions (Addendum)
 Your evaluation in the emergency department was overall reassuring.  We saw no evidence of a blood clot today, and I agree you likely have a pinched nerve (sciatica), and this will hopefully improve on its own in the next few days.  I prescribed you Tylenol  and Lidoderm  patches to use as needed.  I have also prescribed you some gabapentin  that may help with this.  Please do follow-up with your primary care provider for reevaluation, and return to the emergency department with any new or worsening symptoms.  Our pharmacist today did recommend that you consider increasing your Lipitor to 20 mg daily based on your recent blood work from earlier this month--please discuss this further with your primary care provider at your next visit on 06/17/2024.

## 2024-06-14 NOTE — Telephone Encounter (Signed)
 Agree with disposition. She is currently being seen in the ED

## 2024-06-14 NOTE — Telephone Encounter (Signed)
 Patient advised ED; is currently scheduled to see Dr. Watt on 06/17/24. Sending as RICK to PCP and Dr. Watt.

## 2024-06-14 NOTE — ED Provider Notes (Signed)
 "  [facility] Provider Note    Event Date/Time   First MD Initiated Contact with Patient 06/14/24 1345     (approximate)   History   Leg Pain  Pt comes with right leg pain. Pt states this started over the weekend. Pt states pain in her calf and here to rule out DVT. Pt denies any injuries.   HPI Diane Gomez is a 69 y.o. female PMH hypertension, CKD, GERD, hyperlipidemia presents for evaluation of leg pain - Patient has had about 3 days of right posterior hip pain radiating down to her her right foot.  No leg swelling.  Over posterior lateral aspect of leg.  No preceding trauma.  Worse with ambulation. -No chest pain, shortness -Spoke with her primary care clinic over the phone, told to come to ED to rule out DVT     Physical Exam   Triage Vital Signs: ED Triage Vitals [06/14/24 1327]  Encounter Vitals Group     BP (!) 146/90     Girls Systolic BP Percentile      Girls Diastolic BP Percentile      Boys Systolic BP Percentile      Boys Diastolic BP Percentile      Pulse Rate 72     Resp 18     Temp 98.4 F (36.9 C)     Temp src      SpO2 98 %     Weight      Height      Head Circumference      Peak Flow      Pain Score 4     Pain Loc      Pain Education      Exclude from Growth Chart     Most recent vital signs: Vitals:   06/14/24 1327  BP: (!) 146/90  Pulse: 72  Resp: 18  Temp: 98.4 F (36.9 C)  SpO2: 98%     General: Awake, no distress.  CV:  Good peripheral perfusion. RRR, RP 2+ Resp:  Normal effort. CTAB Abd:  No distention. Nontender to deep palpation throughout Other:  Mild tenderness to palpation of right posterior hip.  Pedal pulse right lower extremity 2+.  Positive straight leg raise on the right.   ED Results / Procedures / Treatments   Labs (all labs ordered are listed, but only abnormal results are displayed) Labs Reviewed  BASIC METABOLIC PANEL WITH GFR - Abnormal; Notable for the following components:       Result Value   Sodium 133 (*)    CO2 21 (*)    Glucose, Bld 100 (*)    Creatinine, Ser 1.20 (*)    GFR, Estimated 48 (*)    All other components within normal limits  CBC WITH DIFFERENTIAL/PLATELET - Abnormal; Notable for the following components:   MCV 76.4 (*)    MCH 24.8 (*)    RDW 17.6 (*)    Platelets 408 (*)    All other components within normal limits  PROTIME-INR     EKG  N/a   RADIOLOGY Radiology interpreted by myself radiology report reviewed.  No pathology identified.    PROCEDURES:  Critical Care performed: No  Procedures   MEDICATIONS ORDERED IN ED: Medications  lidocaine  (LIDODERM ) 5 % 1 patch (1 patch Transdermal Patch Applied 06/14/24 1534)  acetaminophen  (TYLENOL ) tablet 1,000 mg (1,000 mg Oral Given 06/14/24 1534)     IMPRESSION / MDM / ASSESSMENT AND PLAN / ED COURSE  I reviewed the triage vital signs and the nursing notes.                              DDX/MDM/AP: Differential diagnosis includes, but is not limited to, sciatica, do not clinically suspect hip fracture or dislocation.  Do not suspect urinary source of discomfort.  Considered but doubt DVT.  Not clinically concerned for ischemic etiology of presentation at this time either.  Plan: - Labs - Tylenol , Lidoderm  - DVT ultrasound  Patient's presentation is most consistent with acute presentation with potential threat to life or bodily function.  ED course below.  Labs unremarkable.  DVT ultrasound negative.  Discharged with Tylenol , Lidoderm  patches and low-dose gabapentin .  Plan for PMD follow-up in 3 days.  Pharmacy had recommended consideration of increasing Crestor to 20 mg daily from current 10 mg daily, recommend she discuss this further with her primary care provider and her upcoming appointment. ED return precautions in place.  Patient agrees with plan.  Clinical Course as of 06/14/24 1603  Mon Jun 14, 2024  1424 CBC reviewed, microcytic otherwise unremarkable [MM]   1442 BMP reviewed, overall unremarkable.  Creatinine mildly elevated above most recent though within usual range, tolerating good oral intake here, will urge further hydration. [MM]  1459 DVT US : IMPRESSION: 1. No evidence of DVT.   [MM]    Clinical Course User Index [MM] Clarine Ozell LABOR, MD     FINAL CLINICAL IMPRESSION(S) / ED DIAGNOSES   Final diagnoses:  Right leg pain  Sciatica of right side     Rx / DC Orders   ED Discharge Orders          Ordered    acetaminophen  (TYLENOL ) 500 MG tablet  Every 6 hours PRN        06/14/24 1518    lidocaine  (LIDODERM ) 5 %  Every 24 hours        06/14/24 1518    gabapentin  (NEURONTIN ) 100 MG capsule  3 times daily        06/14/24 1523             Note:  This document was prepared using Dragon voice recognition software and may include unintentional dictation errors.   Clarine Ozell LABOR, MD 06/14/24 1603  "

## 2024-06-14 NOTE — ED Triage Notes (Addendum)
 Pt comes with right leg pain. Pt states this started over the weekend. Pt states pain in her calf and here to rule out DVT. Pt denies any injuries.

## 2024-06-14 NOTE — Telephone Encounter (Signed)
" °  FYI Only or Action Required?: FYI only for provider: ED advised.  Patient was last seen in primary care on 05/28/2024 by Corwin Antu, FNP.  Called Nurse Triage reporting Leg Pain right calf tightness and constant pain.  Symptoms began several weeks ago.  Interventions attempted: SABRA  Symptoms are: gradually worsening.  Triage Disposition: See HCP Within 4 Hours (Or PCP Triage)  Patient/caregiver understands and will follow disposition?: Sent pt to ED to eval for possible DVT      Message from Rea ORN sent at 06/14/2024 11:04 AM EST  Reason for Triage: Right leg severe sciatic nerve pain. Pt has appt with sports med dr on Thursday but wants to be seen sooner. The pain is so bad that it makes her scream, cry and is difficult to bear weight. Pt stated she is allergic to Tizanidine  and Tylenol  as of recently.   Reason for Disposition  [1] Thigh or calf pain AND [2] only 1 side AND [3] present > 1 hour  (Exception: Chronic unchanged pain.)  Answer Assessment - Initial Assessment Questions 1. ONSET: When did the pain start?      *No Answer* 2. LOCATION: Where is the pain located?      Lower leg (calf area) worse but stated has sciatica from right buttock down leg 3. PAIN: How bad is the pain?    (Scale 1-10; or mild, moderate, severe)     Unable to bear as much weight stated she was afraid walk, sore to touch/3/10 at rest  4. WORK OR EXERCISE: Has there been any recent work or exercise that involved this part of the body?      denies 5. CAUSE: What do you think is causing the leg pain?     Unknown - feels tight to calf  6. OTHER SYMPTOMS: Do you have any other symptoms? (e.g., chest pain, back pain, breathing difficulty, swelling, rash, fever, numbness, weakness)     Left arm tingling was place on tizanidine  (hives) felt it was muscle relaxers  Protocols used: Leg Pain-A-AH  "

## 2024-06-15 ENCOUNTER — Ambulatory Visit: Payer: Self-pay

## 2024-06-15 NOTE — Telephone Encounter (Signed)
 FYI Only or Action Required?: Action required by provider: Lab request.  Patient was last seen in primary care on 05/28/2024 by Corwin Antu, FNP.  Called Nurse Triage reporting Advice Only.  Symptoms began yesterday.  Interventions attempted: Nothing.  Symptoms are: stable.  Triage Disposition: Information or Advice Only Call  Patient/caregiver understands and will follow disposition?: Yes  Reason for Disposition  General information question, no triage required and triager able to answer question  Answer Assessment - Initial Assessment Questions 1. REASON FOR CALL: What is the main reason for your call? or How can I best help you?     Patient denies any new or worsening symptoms in regards to her leg pain related to recent ED visit. 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 She reports incorporating other meats back into her diet and last night she broke out in hives. She denies any current presence of hives/rash. She states that she is supposed to go tomorrow or Thursday to have labs drawn with nephrologist office and is requesting that her PCP contact them to request an alpha gal test with these labs. Contact for Nephrologist office is [phone].   2. SYMPTOMS : Do you have any symptoms?      Leg pain from previous triage, no new/worsening symptoms  3. OTHER QUESTIONS: Do you have any other questions?     No  Protocols used: Information Only Call - No Triage-A-AH  Copied from CRM [id number]. Topic: Clinical - Medical Advice >> Jun 15, 2024  9:53 AM Antony RAMAN wrote: Reason for CRM: went to er and had ultrasound on leg and did not find anything. Leg still hurting

## 2024-06-15 NOTE — Telephone Encounter (Signed)
 Can we see why she is with Dr. Watt?  Looks like she cancelled appt with me for urgent care f/u.

## 2024-06-16 NOTE — Telephone Encounter (Signed)
 NOTED

## 2024-06-16 NOTE — Progress Notes (Unsigned)
" ° ° ° °  Diane Elicker T. Gavan Nordby, MD, CAQ [facility] at [facility] [address] [place], [address]  Phone: [phone]  FAX: [phone]  Diane Gomez - 69 y.o. female  MRN [id number]  Date of Birth: 08-28-1955  Date: 06/17/2024  PCP: Corwin Antu, FNP  Referral: Corwin Antu, FNP  No chief complaint on file.  Subjective:   Diane Gomez is a 69 y.o. very pleasant female patient with There is no height or weight on file to calculate BMI. who presents with the following:  Discussed the use of AI scribe software for clinical note transcription with the patient, who gave verbal consent to proceed.  Patient presents with ongoing shoulder pain.  She was actually seen in the ER 2 days ago for right-sided leg pain. History of Present Illness     Review of Systems is noted in the HPI, as appropriate  Objective:   There were no vitals taken for this visit.  GEN: No acute distress; alert,appropriate. PULM: Breathing comfortably in no respiratory distress PSYCH: Normally interactive.   Laboratory and Imaging Data:  Assessment and Plan:   No diagnosis found. Assessment & Plan   Medication Management during today's office visit: No orders of the defined types were placed in this encounter.  There are no discontinued medications.  Orders placed today for conditions managed today: No orders of the defined types were placed in this encounter.   Disposition: No follow-ups on file.  [employer] One speech-to-text software was used for transcription in this dictation.  Possible transcriptional errors can occur using Animal nutritionist.   Signed,  Jacques DASEN. Faria Casella, MD   Outpatient Encounter Medications as of 06/17/2024  Medication Sig   acetaminophen  (TYLENOL ) 500 MG tablet Take 2 tablets (1,000 mg total) by mouth every 6 (six) hours as needed.   ALPRAZolam (XANAX) 0.5 MG tablet Take 0.5 mg by mouth at bedtime as needed  for anxiety.   atorvastatin  (LIPITOR) 10 MG tablet Take 1 tablet (10 mg total) by mouth daily.   diltiazem (TIAZAC) 240 MG 24 hr capsule Take 180 mg by mouth daily.   esomeprazole (NEXIUM) 20 MG capsule Take 20 mg by mouth daily.   FARXIGA 10 MG TABS tablet Take 10 mg by mouth daily.   gabapentin  (NEURONTIN ) 100 MG capsule Take 1 capsule (100 mg total) by mouth 3 (three) times daily.   lidocaine  (LIDODERM ) 5 % Place 1 patch onto the skin daily for 10 days. Remove & Discard patch within 12 hours or as directed by MD   metoprolol succinate (TOPROL-XL) 50 MG 24 hr tablet Take 50 mg by mouth 2 (two) times daily. Take with or immediately following a meal.   NON FORMULARY Balance of Nature-Fruits and Veggies 3 tablets daily   Probiotic Product (PROBIOTIC-10 PO) Take 1 tablet by mouth daily.   telmisartan (MICARDIS) 20 MG tablet Take 20 mg by mouth.   No facility-administered encounter medications on file as of 06/17/2024.   "

## 2024-06-17 ENCOUNTER — Ambulatory Visit: Admitting: Family

## 2024-06-17 ENCOUNTER — Encounter: Payer: Self-pay | Admitting: Family Medicine

## 2024-06-17 ENCOUNTER — Ambulatory Visit
Admission: RE | Admit: 2024-06-17 | Discharge: 2024-06-17 | Disposition: A | Discharge status: Home / Self Care | Source: Ambulatory Visit | Attending: Family Medicine | Admitting: Family Medicine

## 2024-06-17 ENCOUNTER — Ambulatory Visit (INDEPENDENT_AMBULATORY_CARE_PROVIDER_SITE_OTHER): Admitting: Family Medicine

## 2024-06-17 VITALS — BP 122/78 | HR 66 | Temp 97.8°F | Ht 61.0 in | Wt 122.1 lb

## 2024-06-17 DIAGNOSIS — M5412 Radiculopathy, cervical region: Secondary | ICD-10-CM

## 2024-06-17 DIAGNOSIS — M5416 Radiculopathy, lumbar region: Secondary | ICD-10-CM

## 2024-06-17 DIAGNOSIS — L509 Urticaria, unspecified: Secondary | ICD-10-CM | POA: Diagnosis not present

## 2024-06-17 DIAGNOSIS — M5030 Other cervical disc degeneration, unspecified cervical region: Secondary | ICD-10-CM | POA: Diagnosis not present

## 2024-06-17 DIAGNOSIS — Z91018 Allergy to other foods: Secondary | ICD-10-CM

## 2024-06-17 MED ORDER — PREDNISONE 20 MG PO TABS: ORAL_TABLET | ORAL | 0 refills | Status: AC

## 2024-06-21 ENCOUNTER — Ambulatory Visit: Payer: Self-pay | Admitting: Family Medicine

## 2024-06-22 LAB — ALPHA-GAL PANEL
Allergen, Mutton, f88: <0.1 kU/L
Allergen, Pork, f26: <0.1 kU/L
Beef: <0.1 kU/L
CLASS: 0
CLASS: 0
Class: 0
GALACTOSE-ALPHA-1,3-GALACTOSE IGE*: <0.1 kU/L

## 2024-06-22 LAB — INTERPRETATION:

## 2025-05-17 ENCOUNTER — Encounter: Admitting: Family

## 2025-05-17 ENCOUNTER — Ambulatory Visit
